# Patient Record
Sex: Female | Born: 1935 | Race: White | Hispanic: No | Smoking: Never smoker
Health system: Southern US, Community
[De-identification: ages and names within clinical notes are randomized; demographics above are authoritative.]

## PROBLEM LIST (undated history)

## (undated) DIAGNOSIS — I10 Essential (primary) hypertension: Secondary | ICD-10-CM

## (undated) DIAGNOSIS — K219 Gastro-esophageal reflux disease without esophagitis: Secondary | ICD-10-CM

## (undated) DIAGNOSIS — E039 Hypothyroidism, unspecified: Secondary | ICD-10-CM

## (undated) DIAGNOSIS — I2510 Atherosclerotic heart disease of native coronary artery without angina pectoris: Secondary | ICD-10-CM

## (undated) DIAGNOSIS — I429 Cardiomyopathy, unspecified: Secondary | ICD-10-CM

## (undated) DIAGNOSIS — F0390 Unspecified dementia without behavioral disturbance: Secondary | ICD-10-CM

## (undated) DIAGNOSIS — M1990 Unspecified osteoarthritis, unspecified site: Secondary | ICD-10-CM

## (undated) DIAGNOSIS — E079 Disorder of thyroid, unspecified: Secondary | ICD-10-CM

## (undated) DIAGNOSIS — C801 Malignant (primary) neoplasm, unspecified: Secondary | ICD-10-CM

## (undated) DIAGNOSIS — J90 Pleural effusion, not elsewhere classified: Secondary | ICD-10-CM

## (undated) DIAGNOSIS — E785 Hyperlipidemia, unspecified: Secondary | ICD-10-CM

## (undated) DIAGNOSIS — D649 Anemia, unspecified: Secondary | ICD-10-CM

## (undated) DIAGNOSIS — N182 Chronic kidney disease, stage 2 (mild): Secondary | ICD-10-CM

## (undated) HISTORY — DX: Chronic kidney disease, stage 2 (mild): N18.2

## (undated) HISTORY — DX: Cardiomyopathy, unspecified: I42.9

## (undated) HISTORY — DX: Unspecified dementia, unspecified severity, without behavioral disturbance, psychotic disturbance, mood disturbance, and anxiety: F03.90

## (undated) HISTORY — PX: EYE SURGERY: SHX253

## (undated) HISTORY — PX: CORONARY ANGIOPLASTY WITH STENT PLACEMENT: SHX49

## (undated) HISTORY — DX: Unspecified osteoarthritis, unspecified site: M19.90

## (undated) HISTORY — DX: Atherosclerotic heart disease of native coronary artery without angina pectoris: I25.10

## (undated) HISTORY — PX: OTHER SURGICAL HISTORY: SHX169

## (undated) HISTORY — DX: Hypothyroidism, unspecified: E03.9

---

## 2001-05-09 ENCOUNTER — Ambulatory Visit (HOSPITAL_COMMUNITY): Admission: RE | Admit: 2001-05-09 | Discharge: 2001-05-09 | Payer: Self-pay | Admitting: Gastroenterology

## 2001-05-09 ENCOUNTER — Encounter (INDEPENDENT_AMBULATORY_CARE_PROVIDER_SITE_OTHER): Payer: Self-pay | Admitting: Specialist

## 2001-05-12 ENCOUNTER — Encounter: Payer: Self-pay | Admitting: Gastroenterology

## 2001-05-12 ENCOUNTER — Ambulatory Visit (HOSPITAL_COMMUNITY): Admission: RE | Admit: 2001-05-12 | Discharge: 2001-05-12 | Payer: Self-pay | Admitting: Gastroenterology

## 2001-05-15 ENCOUNTER — Ambulatory Visit: Admission: RE | Admit: 2001-05-15 | Discharge: 2001-08-13 | Payer: Self-pay | Admitting: Radiation Oncology

## 2001-05-21 ENCOUNTER — Ambulatory Visit (HOSPITAL_COMMUNITY): Admission: RE | Admit: 2001-05-21 | Discharge: 2001-05-21 | Payer: Self-pay | Admitting: Radiation Oncology

## 2001-05-21 ENCOUNTER — Encounter: Payer: Self-pay | Admitting: Radiation Oncology

## 2001-06-16 ENCOUNTER — Encounter: Admission: RE | Admit: 2001-06-16 | Discharge: 2001-06-16 | Payer: Self-pay

## 2001-06-16 ENCOUNTER — Ambulatory Visit (HOSPITAL_BASED_OUTPATIENT_CLINIC_OR_DEPARTMENT_OTHER): Admission: RE | Admit: 2001-06-16 | Discharge: 2001-06-16 | Payer: Self-pay

## 2001-06-17 ENCOUNTER — Encounter: Payer: Self-pay | Admitting: *Deleted

## 2001-06-17 ENCOUNTER — Ambulatory Visit (HOSPITAL_COMMUNITY): Admission: RE | Admit: 2001-06-17 | Discharge: 2001-06-17 | Payer: Self-pay | Admitting: *Deleted

## 2001-06-18 ENCOUNTER — Ambulatory Visit (HOSPITAL_COMMUNITY): Admission: AD | Admit: 2001-06-18 | Discharge: 2001-06-18 | Payer: Self-pay

## 2001-06-19 ENCOUNTER — Emergency Department (HOSPITAL_COMMUNITY): Admission: EM | Admit: 2001-06-19 | Discharge: 2001-06-19 | Payer: Self-pay | Admitting: Emergency Medicine

## 2001-07-21 ENCOUNTER — Inpatient Hospital Stay (HOSPITAL_COMMUNITY): Admission: EM | Admit: 2001-07-21 | Discharge: 2001-07-25 | Payer: Self-pay | Admitting: Oncology

## 2001-07-21 ENCOUNTER — Encounter: Payer: Self-pay | Admitting: Oncology

## 2001-09-15 ENCOUNTER — Ambulatory Visit (HOSPITAL_COMMUNITY): Admission: RE | Admit: 2001-09-15 | Discharge: 2001-09-15 | Payer: Self-pay | Admitting: *Deleted

## 2001-09-15 ENCOUNTER — Encounter: Payer: Self-pay | Admitting: *Deleted

## 2001-10-20 ENCOUNTER — Ambulatory Visit (HOSPITAL_COMMUNITY): Admission: RE | Admit: 2001-10-20 | Discharge: 2001-10-20 | Payer: Self-pay | Admitting: Gastroenterology

## 2001-10-20 ENCOUNTER — Encounter: Payer: Self-pay | Admitting: Gastroenterology

## 2002-01-21 ENCOUNTER — Encounter: Payer: Self-pay | Admitting: *Deleted

## 2002-01-21 ENCOUNTER — Ambulatory Visit (HOSPITAL_COMMUNITY): Admission: RE | Admit: 2002-01-21 | Discharge: 2002-01-21 | Payer: Self-pay | Admitting: *Deleted

## 2002-04-06 ENCOUNTER — Encounter: Payer: Self-pay | Admitting: *Deleted

## 2002-04-06 ENCOUNTER — Ambulatory Visit (HOSPITAL_COMMUNITY): Admission: RE | Admit: 2002-04-06 | Discharge: 2002-04-06 | Payer: Self-pay | Admitting: *Deleted

## 2002-07-06 ENCOUNTER — Encounter: Payer: Self-pay | Admitting: *Deleted

## 2002-07-06 ENCOUNTER — Ambulatory Visit (HOSPITAL_COMMUNITY): Admission: RE | Admit: 2002-07-06 | Discharge: 2002-07-06 | Payer: Self-pay | Admitting: *Deleted

## 2002-09-03 ENCOUNTER — Ambulatory Visit (HOSPITAL_COMMUNITY): Admission: RE | Admit: 2002-09-03 | Discharge: 2002-09-03 | Payer: Self-pay | Admitting: *Deleted

## 2002-09-03 ENCOUNTER — Encounter: Payer: Self-pay | Admitting: *Deleted

## 2002-11-08 ENCOUNTER — Inpatient Hospital Stay (HOSPITAL_COMMUNITY): Admission: EM | Admit: 2002-11-08 | Discharge: 2002-11-11 | Payer: Self-pay

## 2002-11-08 ENCOUNTER — Encounter: Payer: Self-pay | Admitting: Emergency Medicine

## 2004-03-14 LAB — HM DEXA SCAN

## 2004-11-14 ENCOUNTER — Ambulatory Visit: Payer: Self-pay | Admitting: Hematology & Oncology

## 2005-06-15 ENCOUNTER — Ambulatory Visit: Payer: Self-pay | Admitting: Hematology & Oncology

## 2006-02-23 ENCOUNTER — Emergency Department (HOSPITAL_COMMUNITY): Admission: EM | Admit: 2006-02-23 | Discharge: 2006-02-23 | Payer: Self-pay | Admitting: Emergency Medicine

## 2006-06-20 ENCOUNTER — Encounter: Admission: RE | Admit: 2006-06-20 | Discharge: 2006-06-20 | Payer: Self-pay | Admitting: Family Medicine

## 2007-10-14 ENCOUNTER — Ambulatory Visit (HOSPITAL_COMMUNITY): Admission: RE | Admit: 2007-10-14 | Discharge: 2007-10-14 | Payer: Self-pay | Admitting: Gastroenterology

## 2007-10-30 ENCOUNTER — Ambulatory Visit (HOSPITAL_COMMUNITY): Admission: RE | Admit: 2007-10-30 | Discharge: 2007-10-30 | Payer: Self-pay | Admitting: Gastroenterology

## 2007-11-25 ENCOUNTER — Ambulatory Visit (HOSPITAL_COMMUNITY): Admission: RE | Admit: 2007-11-25 | Discharge: 2007-11-25 | Payer: Self-pay | Admitting: Gastroenterology

## 2007-12-17 ENCOUNTER — Ambulatory Visit (HOSPITAL_COMMUNITY): Admission: RE | Admit: 2007-12-17 | Discharge: 2007-12-17 | Payer: Self-pay | Admitting: Gastroenterology

## 2008-01-01 ENCOUNTER — Ambulatory Visit (HOSPITAL_COMMUNITY): Admission: RE | Admit: 2008-01-01 | Discharge: 2008-01-01 | Payer: Self-pay | Admitting: Gastroenterology

## 2008-05-25 ENCOUNTER — Ambulatory Visit (HOSPITAL_COMMUNITY): Admission: RE | Admit: 2008-05-25 | Discharge: 2008-05-25 | Payer: Self-pay | Admitting: Gastroenterology

## 2008-06-10 ENCOUNTER — Ambulatory Visit (HOSPITAL_COMMUNITY): Admission: RE | Admit: 2008-06-10 | Discharge: 2008-06-10 | Payer: Self-pay | Admitting: Gastroenterology

## 2010-01-19 ENCOUNTER — Ambulatory Visit (HOSPITAL_COMMUNITY): Admission: RE | Admit: 2010-01-19 | Discharge: 2010-01-19 | Payer: Self-pay | Admitting: Ophthalmology

## 2010-02-16 ENCOUNTER — Ambulatory Visit (HOSPITAL_COMMUNITY): Admission: RE | Admit: 2010-02-16 | Discharge: 2010-02-16 | Payer: Self-pay | Admitting: Ophthalmology

## 2010-09-10 ENCOUNTER — Encounter: Payer: Self-pay | Admitting: Interventional Radiology

## 2010-11-04 ENCOUNTER — Encounter: Payer: Self-pay | Admitting: *Deleted

## 2010-11-04 DIAGNOSIS — M8580 Other specified disorders of bone density and structure, unspecified site: Secondary | ICD-10-CM | POA: Insufficient documentation

## 2010-11-04 DIAGNOSIS — G8929 Other chronic pain: Secondary | ICD-10-CM

## 2010-11-04 DIAGNOSIS — I2510 Atherosclerotic heart disease of native coronary artery without angina pectoris: Secondary | ICD-10-CM

## 2010-11-04 DIAGNOSIS — M1990 Unspecified osteoarthritis, unspecified site: Secondary | ICD-10-CM

## 2010-11-04 DIAGNOSIS — M549 Dorsalgia, unspecified: Secondary | ICD-10-CM

## 2010-11-04 DIAGNOSIS — I10 Essential (primary) hypertension: Secondary | ICD-10-CM | POA: Insufficient documentation

## 2010-11-04 DIAGNOSIS — C159 Malignant neoplasm of esophagus, unspecified: Secondary | ICD-10-CM | POA: Insufficient documentation

## 2010-11-04 DIAGNOSIS — E039 Hypothyroidism, unspecified: Secondary | ICD-10-CM | POA: Insufficient documentation

## 2010-11-04 DIAGNOSIS — K219 Gastro-esophageal reflux disease without esophagitis: Secondary | ICD-10-CM | POA: Insufficient documentation

## 2010-11-04 DIAGNOSIS — E785 Hyperlipidemia, unspecified: Secondary | ICD-10-CM | POA: Insufficient documentation

## 2010-11-06 LAB — POCT I-STAT 4, (NA,K, GLUC, HGB,HCT)
Glucose, Bld: 99 mg/dL (ref 70–99)
HCT: 38 % (ref 36.0–46.0)
Hemoglobin: 12.9 g/dL (ref 12.0–15.0)
Potassium: 4.4 mEq/L (ref 3.5–5.1)

## 2010-11-07 ENCOUNTER — Encounter: Payer: Self-pay | Admitting: *Deleted

## 2011-01-02 NOTE — Op Note (Signed)
NAME:  Allison Collier, Allison Collier              ACCOUNT NO.:  [id number]   MEDICAL RECORD NO.:  [id number]          PATIENT TYPE:  AMB   LOCATION:  ENDO                         FACILITY:  [facility]   PHYSICIAN:  John C. Madilyn Fireman, M.D.    DATE OF BIRTH:  May 02, 1921   DATE OF PROCEDURE:  01/01/2008  DATE OF DISCHARGE:                               OPERATIVE REPORT   PROCEDURE:  Esophagoscopy with esophageal dilatation.   INDICATIONS FOR PROCEDURE:  Known stricture from previous esophageal  cancer and radiation 6 years ago.  Procedure is with the intent of  gradually dilating her stricture up to allow her to liberalize her diet  and also at the patient's request to consider removed her PEG tube which  could not be easily removed pulling it through the stoma and might  presumably need to be cut internally.   PROCEDURE:  The patient was placed in the left lateral decubitus  position and placed on the pulse monitor with continuous low-flow oxygen  delivered by nasal cannula.  She was sedated with 75 mcg IV fentanyl and  8 mg IV Versed.  The Olympus video endoscope was advanced under direct  vision into the oropharynx and esophagus.  As before, there was tight  stricture at approximately 30 cm with significant fibrosis, no visible  evidence of malignancy and I could not pass the scope beyond it.  The  guidewire was passed under fluoroscopic visualization easily into the  stomach and Savary dilators of 12.8 and 14 were passed successively over  the guidewire with moderate resistance and on the first dilator and  slightly more resistance on the second.  There was some blood seen on  the second dilator only which was removed together with the wire.  I  elected not to attempt to repeat endoscopy.  The scope was then  withdrawn and the patient returned to the recovery room in stable  condition.  She tolerated the procedure well.  There were no immediate  complications.   IMPRESSION:  Persistent esophageal  stricture with inability to dilate  any beyond 14 mm which was what was also done 2 weeks earlier.   PLAN:  Will have the patient follow-up in my office and discuss options  from here including simply remaining on a pureed full liquid diet and  leaving the PEG tube in as a backup versus further dilatation versus  possible esophageal stenting.           ______________________________  Everardo All. Madilyn Fireman, M.D.     JCH/MEDQ  D:  01/01/2008  T:  01/01/2008  Job:  [id number]

## 2011-01-02 NOTE — Op Note (Signed)
NAME:  Allison Collier, Allison Collier              ACCOUNT NO.:  [id number]   MEDICAL RECORD NO.:  [id number]          PATIENT TYPE:  AMB   LOCATION:  ENDO                         FACILITY:  [facility]   PHYSICIAN:  John C. Madilyn Fireman, M.D.    DATE OF BIRTH:  1921/06/15   DATE OF PROCEDURE:  12/17/2007  DATE OF DISCHARGE:                               OPERATIVE REPORT   PROCEDURE:  Esophagoscopy with esophageal dilatation.   INDICATIONS FOR PROCEDURE:  Radiation stricture from remote treatment of  esophageal cancer.   DESCRIPTION OF PROCEDURE:  The patient was placed in the left lateral  decubitus position and placed on the pulse monitor with continuous low-  flow oxygen delivered by nasal cannula.  She was sedated with 50 mcg IV  fentanyl 4 mg of IV Versed.  The Olympus video endoscope was advanced  under direct vision to the oropharynx and esophagus.  The esophagus was  straight and of normal caliber, initially, but tapered to a pin point  stricture at about 28 cm.   I elected not to try to pass the scope through it.  A guidewire was  passed easily into the stomach and Savary dilators of 12, 12.8 and 14  were passed over the guidewire with mild-to-moderate resistance and  blood seen on the last dilator only.  I elected not to go back down with  scope.  The last dilator was withdrawn, and the patient returned to the  recovery room in stable condition.  She tolerated the procedure well,  and there were no immediate complications.  There were no immediate  complications.   IMPRESSION:  1. Persistent esophageal stricture dilated to 14 mm.   PLAN:  We will repeat the procedure in 2-3 weeks and attempt dilation up  to 15 or perhaps 16 mm.  At that time if this is successful, we will  consider attempting to remove the PEG-tube which may require cutting the  tube internally.           ______________________________  Everardo All Madilyn Fireman, M.D.     JCH/MEDQ  D:  12/17/2007  T:  12/17/2007  Job:  [id number]   cc:    Ernestina Penna, M.D.  Fax: [phone]

## 2011-01-02 NOTE — Op Note (Signed)
NAME:  Allison Collier, Allison Collier              ACCOUNT NO.:  [id number]   MEDICAL RECORD NO.:  [id number]          PATIENT TYPE:  AMB   LOCATION:  ENDO                         FACILITY:  [facility]   PHYSICIAN:  John C. Madilyn Fireman, M.D.    DATE OF BIRTH:  1921/06/07   DATE OF PROCEDURE:  10/14/2007  DATE OF DISCHARGE:                               OPERATIVE REPORT   PROCEDURE PERFORMED:  Esophagoscopy with esophageal dilatation.   INDICATIONS. FOR PROCEDURE:  Patient with esophageal stricture secondary  to prior radiation for esophageal cancer.  She is existing off a full  liquid diet which she is able to take independently with PEG tube which  she also wants removed.  The procedure is to achieve further dilatation  of the esophagus which was originally dilated to 9 mm one week ago, and  if possible, to reach the stomach and potentially removed the PEG, as  well.   PROCEDURE:  The patient was placed in the left lateral decubitus  position and placed on the pulse monitor with continuous low flow oxygen  delivered by nasal cannula.  She was sedated with 15 mcg IV fentanyl and  5 mg IV Versed.  The Olympus video endoscope was advanced under direct  vision into the oropharynx and esophagus.  The esophagus was straight  and of normal caliber only down to about 25 cm where there was rapid  tapering to a tight narrowing that I could not traverse with the scope.  It had a benign appearance as before and appeared slightly more open  than on the previous procedure.  I was able to pass a guidewire under  fluoroscopic visualization freely into the stomach and passed Savary  dilators of 9, 10, and 11 mm with moderate resistance and a moderate  amount of blood seen on the second and third dilators.  I did reintubate  her esophagus, but it did not appear to be feasible to pass the standard  endoscope beyond through the traumatized stricture and I did not attempt  it.  The scope was then withdrawn and the patient  returned to the  recovery room in stable condition.  She tolerated the procedure well and  there were no immediate complications.   Arrangements were made to have radiology come over and remove her  gastrostomy tube that they had placed previously.   IMPRESSION:  Persistent esophageal stricture, unable to traverse the  scope, but dilated to 11 mm.   PLAN:  Will continue serial dilatations with next procedure scheduled  for approximately two weeks.           ______________________________  Everardo All Madilyn Fireman, M.D.     JCH/MEDQ  D:  10/14/2007  T:  10/14/2007  Job:  [id number]   cc:   Ernestina Penna, M.D.  Fax: [phone]

## 2011-01-02 NOTE — Op Note (Signed)
NAME:  Allison Collier, Allison Collier              ACCOUNT NO.:  [id number]   MEDICAL RECORD NO.:  [id number]          PATIENT TYPE:  AMB   LOCATION:  ENDO                         FACILITY:  [facility]   PHYSICIAN:  John C. Madilyn Fireman, M.D.    DATE OF BIRTH:  Nov 29, 1920   DATE OF PROCEDURE:  06/10/2008  DATE OF DISCHARGE:                               OPERATIVE REPORT   REPORT TITLE:  An esophagogastroduodenoscopy with esophageal dilatation  and foreign body removal.   INDICATIONS FOR PROCEDURE:  The patient with chronic radiation stricture  secondary to radiation therapy to previous esophageal carcinoma.  She  has undergone serial dilatations and would like her PEG tube out, if  possible.  There was some difficulty in original attempt to pulling the  tube straight out and have delayed any further attempts until we had to  dilate her esophagus sufficiently in case we needed to cut the tube and  retrieve the bumper through the esophagus.   PROCEDURE:  The patient was placed  on the pulse monitor with continuous  low-flow oxygen delivered by nasal cannula.  She was sedated with 40 mcg  IV fentanyl and 4 mg IV Versed.  The Olympus video endoscope was  advanced under direct vision into the oropharynx and esophagus.  As on  previous procedures, there was a narrowing from about 25-28 cm to a  tight lumen, the scope would not traverse.  I elected to pass a  guidewire through it as on previous occasions under fluoroscopic  visualization with guidewire anchored in the stomach, and I then passed  dilators of 12.8-14 mm with mild-to-moderate resistance and some blood  seen on withdrawal of the last two dilators.  The scope was reintroduced  and the friable bloody area of the esophagus was seen at about 25-28 cm,  endoscope passed easily through it.  The distal 10-cm esophagus beyond  it appeared relatively normal.  The stomach was entered and old brownish  PEG tube bumper was seen.  The remainder of the stomach and  duodenum was  inspected, and appeared to be within normal limits.  Jeananne Rama  from  Radiology assisted with the procedure and attempted to pull the PEG tube  out through the stomach with endoscopic visualization maintained  internally, however, the tubing broke and he grasped the remaining  portions and pulled it, neck broke as well.  We, therefore, used a Lucina Mellow  Net snare the bumper and remained short segment of tube and pulled it  out through the esophagus with mild resistance.  The patient was then  returned to the recovery room in stable condition.  Her PEG site was  dressed with antibiotic ointment, sterile 4 x 4s.  The patient tolerated  the procedure well and there were no immediate complications.   IMPRESSION:  1. Esophageal stricture dilated to 14 mm.  2. Status post successful removal of foreign body PEG tube fragment      with a [employer].   PLAN:  Advance diet as tolerated and we will observe response to  dilatation.  ______________________________  Everardo All Madilyn Fireman, M.D.    JCH/MEDQ  D:  06/10/2008  T:  06/11/2008  Job:  [id number]   cc:   Ernestina Penna, M.D.

## 2011-01-02 NOTE — Op Note (Signed)
NAME:  Allison Collier, ROUGEAU              ACCOUNT NO.:  [id number]   MEDICAL RECORD NO.:  [id number]          PATIENT TYPE:  AMB   LOCATION:  ENDO                         FACILITY:  [facility]   PHYSICIAN:  John C. Madilyn Fireman, M.D.    DATE OF BIRTH:  1921-02-03   DATE OF PROCEDURE:  05/25/2008  DATE OF DISCHARGE:                               OPERATIVE REPORT   INDICATIONS FOR PROCEDURE:  The patient with history of chronic  stricture related to esophageal cancer and subsequent radiation, which  she appears to have been cured of.   PROCEDURE:  The patient was placed in the left lateral decubitus  position and placed on the pulse monitor with continuous low-flow oxygen  delivered by nasal cannula.  She was sedated with 70 mcg IV fentanyl and  7 mg IV Versed.  Olympus video endoscope was advanced under direct  vision into the oropharynx and esophagus.  The esophagus was straight of  normal caliber proximally with  circular narrowing about 28 cm beyond  which the scope would not pass.  I could see the mucosa beyond it.  Guidewire was placed under fluoroscopic visualization into the stomach  and the scope withdrawn.  Savary dilators of 9, 11, 12, and 12.8 were  passed successively with minimal resistance and a slight amount of blood  seen on the third and fourth dilators only.  The last dilator was  removed with the wire and the scope reintroduced.  The esophagus was  friable, but beyond the proximal stricture, that the stricture appeared  to be of very short length and the scope passed into the stomach without  much resistance.  There was no PEG bumper seen in the gastric lumen.  Stomach otherwise appeared normal as the duodenum.  The scope was then  withdrawn and the patient returned to the recovery room in stable  condition.  She tolerated the procedure well.  There were no immediate  complications.   IMPRESSION:  Esophageal stricture dilated to 12.8 mm.   PLAN:  We will probably repeat EGD with  esophageal dilatation and remove  her PEG in about 2 weeks if eating is sufficiently improved.           ______________________________  Everardo All Madilyn Fireman, M.D.     JCH/MEDQ  D:  05/25/2008  T:  05/26/2008  Job:  [id number]   cc:   Ernestina Penna, M.D.

## 2011-01-02 NOTE — Op Note (Signed)
NAME:  Allison Collier, Allison Collier              ACCOUNT NO.:  [id number]   MEDICAL RECORD NO.:  [id number]          PATIENT TYPE:  AMB   LOCATION:  ENDO                         FACILITY:  [facility]   PHYSICIAN:  John C. Madilyn Fireman, M.D.    DATE OF BIRTH:  [birth year]   DATE OF PROCEDURE:  10/30/2007  DATE OF DISCHARGE:                               OPERATIVE REPORT   Esophagoscopy with esophageal dilatation.   INDICATIONS FOR PROCEDURE:  Dysphagia with history of esophageal  stricture, last dilated to 11 mm approximately two and a half weeks ago.   PROCEDURE:  The patient was placed in the left lateral decubitus  position and placed on the pulse monitor with continuous low-flow oxygen  delivered by nasal cannula.  He was sedated with 40 mcg IV fentanyl and  4 mg IV Versed.  The Olympus video endoscope was advanced under direct  vision into the oropharynx and esophagus.  The esophagus was straight  initially but narrowed rapidly to a tight stricture at about 25 cm,  estimated diameter of about 8 mm.  I could not pass a standard scope  through it.  Under fluoroscopic visualization, a guidewire was passed  into the stomach and then Savary dilators of 11, 12, and 12.8 were  passed over with moderate resistance with a small amount of blood over  the guidewire under fluoroscopic visualization.  I elected not to try to  intubate the stomach with the scope afterwards.  The last dilator was  removed together with a wire, and the patient returned to the recovery  room in stable condition.  She tolerated the procedure well, and there  were no immediate complications.   IMPRESSION:  Persistent tight esophageal stricture.   PLAN:  Will continue serial dilations with an attempt to get her dilated  at least to 14-15 mm at which time we will also complete inspection of  the esophagus and stomach and attempt to remove her PEG tube.           ______________________________  Everardo All Madilyn Fireman, M.D.     JCH/MEDQ  D:   10/30/2007  T:  10/31/2007  Job:  [id number]   cc:   Ernestina Penna, M.D.

## 2011-01-02 NOTE — Op Note (Signed)
NAMETALAYAH, PICARDI              ACCOUNT NO.:  [id number]   MEDICAL RECORD NO.:  [id number]          PATIENT TYPE:  AMB   LOCATION:  ENDO                         FACILITY:  [facility]   PHYSICIAN:  John C. Madilyn Fireman, M.D.    DATE OF BIRTH:  03/06/1921   DATE OF PROCEDURE:  11/25/2007  DATE OF DISCHARGE:                               OPERATIVE REPORT   PROCEDURE:  Esophagoscopy with esophageal dilatation.   INDICATIONS FOR PROCEDURE:  Dysphagia with known esophageal stricture.   DESCRIPTION OF PROCEDURE:  The patient was placed in the left lateral  decubitus position and placed on the pulse monitor with continuous low-  flow oxygen delivered by nasal cannula.  He was sedated with 37.5 mcg  intravenous fentanyl and 3 mg intravenous Versed.  The Olympus video  endoscope was advanced under direct vision into the oropharynx and  esophagus.  The esophagus was straight and of normal caliber initially  with a rapid tapering to 8 mm.  The scope would not pass through it.  I  advanced a guidewire through the needle all strictured area and under  fluoroscopic visualization passed 12 and 12.8 mm dilators with moderate  resistance and a significant amount of blood seen on the last dilator.  The last dilator was removed together with the wire.  I did not  reintroduce the scope or try to advance it beyond the stricture.  The  scope was then withdrawn and the patient returned to the recovery room  in stable condition.  She tolerated the procedure well.  There were no  immediate complications.   IMPRESSION:  Persistent stricture of the middle esophagus.   PLAN:  Will proceed with a repeat study in two weeks to try to dilate  her up to 14-15 mm and eventually remove the PEG tube as well.           ______________________________  Everardo All. Madilyn Fireman, M.D.     JCH/MEDQ  D:  11/25/2007  T:  11/25/2007  Job:  [id number]   cc:   Ernestina Penna, M.D.  Fax: [phone]

## 2011-01-05 NOTE — Procedures (Signed)
[facility]  Patient:    BERTIE, MCCONATHY Visit Number: [id number] MRN: [id number]          Service Type: Attending:  Zigmund Daniel, M.D. Dictated by:   Zigmund Daniel, M.D. Proc. Date: 06/18/01                             Procedure Report  PREOPERATIVE DIAGNOSIS: Malpositioning of central venous port.  POSTOPERATIVE DIAGNOSIS:  Malpositioning of central venous port.  OPERATION:  Revision of port.  SURGEON:  Zigmund Daniel, M.D.  ANESTHESIA:  Local with sedation.  PROCEDURE: After the patient was lightly sedated and had removal of the access needle from her port and careful preparation and draping of the area of the port, I liberally infused local anesthetic around the port and the venous access incision.  I then opened and slightly enlarged the incision where I had accessed the vein and saw that there was an acute angle of the venous tubing at that point.  Evidently, the port had ridden up somewhat in the pocket and was causing the malpositioning and kink.  I tried to reposition it by twisting it and advancing the catheter, but in that position at that location, I could not do so.  I, therefore, opened the incision which had been made to make a pocket for the port and enlarged the pocket somewhat so that I could place the port down a bit, and I sutured it down securely with two sutures of 4-0 Vicryl, and that produced a nice, smooth curve of the venous tubing as it went into the venous access site and left the port.  I then used fluoroscopy to confirm that and then closed the incisions with intercuticular 4-0 Vicryl and Steri-Strips.  I accessed the port, and it flushed nicely and allowed return of blood very nicely.  I flushed it with Heparin concentrate and capped the access needle, leaving the port accessed.  I then dressed the area with a bandage.  The patient tolerated the operation well. Dictated by:   Zigmund Daniel,  M.D. Attending:  Zigmund Daniel, M.D. DD:  06/18/01 TD:  06/19/01 Job: [id number]

## 2011-01-05 NOTE — H&P (Signed)
NAME:  Allison Collier, Allison Collier                        ACCOUNT NO.:  [id number]   MEDICAL RECORD NO.:  [id number]                   PATIENT TYPE:  INP   LOCATION:  2106                                 FACILITY:  [facility]   PHYSICIAN:  Francisca December, M.D.               DATE OF BIRTH:  05-Dec-1920   DATE OF ADMISSION:  11/08/2002  DATE OF DISCHARGE:                                HISTORY & PHYSICAL   ADMISSION DIAGNOSES:  Acute anterior myocardial infarction.   CHIEF COMPLAINT:  Chest pain/arm pain, unrelieved with Tylenol and rest.   HISTORY OF PRESENT ILLNESS:  A 77 year old widowed white female with a  history of hypertension and elevated cholesterol in the past, not currently  on medications.  Also with a history of esophageal CA and PEG tube, but no  prior history of MI or CAD known to patient.   For about three days, the patient had been experiencing episodic bilateral  arm cortical aching to the bone, right greater than left, and substernal  chest discomfort with tightness in the throat.  She would notice some mild  shortness of breath with activity.  All episodes would occur at rest, and  she would take Tylenol with improvement in her symptoms over a 30-60 minute  period of time.  Last evening,  however, she developed bilateral arm aching,  right greater than left, substernal chest pain, throat tightness, nausea,  and shortness of breath.  This was about 9:30 p.m.  She took Tylenol and  tried to lay down without improvement in her symptoms.  Because of the  persistence of discomfort and increasing severity, the patient was brought  to the emergency room by a family member.  EKG revealed ST elevation in the  precordial leads.  She was taken emergently to the catheterization lab by  Dr. Amil Amen.   ALLERGIES:  TAXOL.   MEDICATIONS:  1. Protonix.  2. Tylenol p.r.n.  3. Jevity 1-1/2 cans t.i.d.  4. Has been on hydrochlorothiazide remotely.   PAST MEDICAL HISTORY:  1. Squamous cell  esophageal CA; status post PEG tube placement, Port-A-Cath.  2. Hypertension.  3. History of elevated cholesterol.  4. History of pernicious anemia (?).  5. Hemorrhoid.   SOCIAL HISTORY:  The patient has been widowed for several years (husband  died when youngest daughter was 22 months old).  Lives in an apartment with  her daughter.  She is the mother of 70, grandmother of 8, and great-  grandmother of 4.  She denies tobacco or alcohol use.  Retired from a [employer].  She never drove.  Rarely uses a cane.   FAMILY HISTORY:  No significant CAD or other medical problems.   REVIEW OF SYSTEMS:  Denies fevers, chills, cough, cold, congestion, or  reflux.  No melena or hematuria.  No dysuria, vomiting, or edema.  No  claudication or depression.  No presyncope or syncope.  No palpitations.   PHYSICAL EXAMINATION:  VITAL SIGNS:  Examination as of 1700 on 11/08/02  revealed a temperature of 96.8, blood pressure 106/58, pulse 90,  respirations 17, SGOT 98% on two liters nasal cannula.  GENERAL:  Alert and oriented x3.  In no acute distress.  Elderly appearing.  Slightly obese.  HEENT:  Normocephalic and atraumatic.  Positive glasses.  Dentition in poor  repair.  NECK:  Supple without bruits or masses.  LUNGS:  Clear to auscultation with symmetric excursion.  COR:  Regular rate and rhythm without murmurs, gallops, or rubs.  Normal S1  and S2.  ABDOMEN:  Soft, nontender, slightly obese.  Positive PEG site, stable.  Positive bowel sounds.  EXTREMITIES:  Right groin with 1+ ecchymosis.  Slightly full.  No palpable  hematoma.  No bruit.  Distal pulses are intact and equal bilaterally.  She  does have spider and varicose veins.  No lower extremity edema.  GENITALIA:  Normal external female genitalia.  SKIN:  With age spots.  Cafe au lait spot left cheek.  Bruising on her  left forearm.  NEUROLOGIC:  Nonfocal.  Mentation intact.  Strength equal bilaterally.   DIAGNOSTIC TESTS:   1. EKG at 0422 on 11/08/02 showed sinus rhythm with T wave inversion in aVL,     ST elevation about 1 to 1.5 mm in V1 through V3.  2. EKG on 0854 on 11/08/02 showed normal sinus rhythm with T wave inversion     in aVL, persistent ST elevation of V1 through V3 with suggestion of     slight ST elevation in V4.  3. Chest x-ray on 11/08/02 showed no acute abnormalities.   LABORATORY DATA:  WBC 5.7, hemoglobin 12.0, and platelet count 144 (slightly  low).  Potassium 3.6, BUN 18, creatinine 0.7, glucose 151.  CK 212, 2227.  MB 24.6, 420.6.  Troponin I 0.77.   IMPRESSION:  1. Acute anterior myocardial infarction.  2. History of hypertension.  3. History of elevated cholesterol.  4. Squamous cell esophageal cancer.  5. History of pernicious anemia.  6. Hemorrhoids.    PLAN:  Admit.  The patient was taken emergently to the cardiac  catheterization lab by Dr. Amil Amen.  IV heparin, IIb/IIIa inhibitor, and IV  nitroglycerin.  Aspirin and Plavix.  Beta blocker therapy.  Consider ACE  inhibitor if blood pressure tolerates.  Serial cardiac enzymes.  Cardiac  rehab phase I and II.  Check serial EKGs.  Check fasting lipid profile.  Protonix and Jevity.     Georgiann Cocker Jernejcic, P.A.                   Francisca December, M.D.    TCJ/MEDQ  D:  11/08/2002  T:  11/09/2002  Job:  [id number]   cc:   Dr. Evalee Jefferson, M.D.  [address]  [address]  [place]  [place] [address]  Fax: [phone]

## 2011-01-05 NOTE — Discharge Summary (Signed)
[facility]  Patient:    Allison Collier, Allison Collier Visit Number: [id number] MRN: [id number]          Service Type: MED Location: 4E 0401 01 Attending Physician:  Cathleen Corti Dictated by:   Marnee Guarneri, M.D. Admit Date:  07/21/2001 Discharge Date: 07/25/2001   CC:         John C. Madilyn Fireman, M.D.  Margaretmary Dys, M.D.   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  She was treated initially with carboplatin/Taxol but had a reaction to Taxol.  She has been on weekly carboplatin since that time.  She also received four days of continuous infusion 5-FU beginning June 19, 2001 concurrent with radiation.  She has tolerated all of her treatment well with esophagitis requiring PEG tube placement; however, since November 29, prior to admission, she had problems with nausea and vomiting and had been unable to tolerate her Jevity feeds. She also presented with a fever of 100.2.  She was admitted for hydration, nausea control, and fever workup.  HOSPITAL COURSE:  The patient did extremely well.  She was started on Ancef antibiotics, as well as Neupogen for profound neutropenia.  Her labs showed that she had an ANC of 0.7, hemoglobin was 10.5, platelet count was 132.  She was hydrated well and her Jevity feeds were continued.  Pureed diet was instituted on December 5 with tolerance.  She remained afebrile during the course of her hospitalization.  She completed the final rounds of radiation therapy and at the time of discharge was ambulating well and was tolerating a pureed diet well with Jevity tube feeds in continuation.  DISCHARGE DIAGNOSES: 1. Squamous cell carcinoma of the esophagus, status post concurrent    chemotherapy and radiation. 2. Febrile neutropenia,  resolved. 3. Esophagitis, resolved. 4. Malnutrition with percutaneous endoscopic gastrostomy tube placement and    Jevity tube feeds.  FOLLOWUP:  Hospital followup will be with Dr. Aliene Altes one week from discharge.  DISCHARGE MEDICATIONS: 1. Carafate. 2. Phenergan. 3. Cipro 750 mg 1 tablet p.o. b.i.d. for the next five days to complete her    antibiotic therapy. Dictated by:   Marnee Guarneri, M.D. Attending Physician:  Cathleen Corti DD:  07/25/01 TD:  07/25/01 Job: [id number]

## 2011-01-05 NOTE — Procedures (Signed)
[facility]  Patient:    Allison Collier, Allison Collier Visit Number: [id number] MRN: [id number]          Service Type: END Location: ENDO Attending Physician:  Louie Bun Proc. Date: 05/09/01 Admit Date:  05/09/2001   CC:         Monica Becton, M.D.  Marnee Guarneri, M.D.   Procedure Report  PROCEDURE:  Esophagoscopy with biopsy.  INDICATION:  Gradually progressive solid food dysphagia.  DESCRIPTION OF PROCEDURE:  The patient was placed in the left lateral decubitus position and placed on the pulse monitor with continuous low-flow oxygen delivered by nasal cannula.  She was sedated with 50 mg IV Demerol and 4 mg IV Versed.  The Olympus video endoscope was advanced into the oropharynx and esophagus.  Immediately upon entering the esophagus, there was noted to be a friable mass at the proximal margin 20 cm from the incisors.  This occupied about 50% of the circumference of the lumen and appeared to become more circumferentially at about 25 cm where the lumen could no longer be traversed and was visible as a small 3-4 mm opening, that it was felt probably would admit the tip of a pediatric scope.  Biopsies were taken of this lesion which appeared frankly malignant.  There are no obvious fistulas of the bronchial tree that could be appreciated endoscopically.  The mucosa proximal to the tumor appeared normal.  I could not see a squamocolumnar line and presumably from its location, this is a squamous tumor.  The scope was then withdrawn, and the patient returned to the recovery room in stable condition.  She tolerated the procedure well, and there were no immediate complications.  IMPRESSION:  Esophageal mass, likely malignant.  PLAN:  Will await biopsy results, obtain CT scan and arrange for consultation with general surgery and hematology/oncology. Attending Physician:  Louie Bun DD:  05/09/01 TD:  05/10/01 Job: [id number]

## 2011-01-05 NOTE — Cardiovascular Report (Signed)
NAME:  Allison Collier, Allison Collier                        ACCOUNT NO.:  [id number]   MEDICAL RECORD NO.:  [id number]                   PATIENT TYPE:  INP   LOCATION:  2106                                 FACILITY:  [facility]   PHYSICIAN:  Francisca December, M.D.               DATE OF BIRTH:  1921/05/17   DATE OF PROCEDURE:  11/08/2002  DATE OF DISCHARGE:                              CARDIAC CATHETERIZATION   PROCEDURES:  1. Left heart catheterization.  2. Coronary angiography.  3. Left ventriculogram.  4. Percutaneous transluminal coronary angioplasty/drug-eluting stent     implantation, proximal anterior descending artery.   INDICATIONS:  The patient is a 77 year old woman who has been having chest  discomfort relieved by Tylenol for the past two evenings.  This evening at  around 9:30 p.m. she had a recurrence of the discomfort, this time not  relieved by Tylenol.  It was associated with some diaphoresis and nausea.  No significant dyspnea.  She was transferred to the [facility] emergency room by  EMS, where she arrived at 00:44.  She was administered baby aspirin and  heparin as well as IV nitroglycerin.  An electrocardiogram revealed the  presence of an acute anterior wall myocardial infarction.  She was therefore  brought to the catheterization laboratory at this time to provide for  percutaneous revascularization.   DESCRIPTION OF PROCEDURE:  The patient was brought to the cardiac  catheterization laboratory, where the right groin was prepped and draped in  the usual sterile fashion.  Local anesthesia was obtained with the  infiltration of 1% lidocaine.  A 7 French catheter sheath was inserted  percutaneously into the right femoral artery utilizing an anterior approach  over a guiding J-wire.  A 6 French #4 right Judkins catheter was advanced to  the ascending aorta, where the pressure was recorded.  The right coronary os  was then engaged and cineangiography performed in LAO and RAO  projections.  The 6 French #4 right Judkins was exchanged for a 6 [place] #4 left Judkins.  Cineangiography of the left coronary artery was conducted in multiple LAO  and RAO projections.  The 6 [place] #4 left Judkins was then exchanged for a  7 [place] #3.5 Voda left guiding catheter.  An ACT was obtained and found to  be 288 seconds.  After cannulation of the left coronary os, a 0.014 inch  Scimed Luge intracoronary guidewire was passed across the lesion in the  proximal LAD without difficulty.  This provided some minimal antegrade flow  and identified a discrete 90-100% lesion at the origin of the small diagonal  branch.  A 3.5/15 mm Scimed Maverick intracoronary balloon was advanced into  position and inflated to a peak pressure of 8 atmospheres for approximately  one minute.  This balloon was deflated and removed.  The patient received  0.2 mg of nitroglycerin intracoronary.  A 3.5/16 mm Scimed Taxus drug-  eluting stent  was then advanced into position and inflated there to 14  atmospheres for 60 seconds.  This balloon was deflated and removed.  The  patient received a second intracoronary aliquot of 0.2 mg of nitroglycerin.  Cineangiography was then performed in orthogonal views both with and without  the guidewire in place, confirming wide patency of the anterior descending  artery.  Initial TIMI grade was 2, increasing to 3 at the completion of the  procedure.  The guiding catheter was then removed, the sheath was sutured  into place, and the patient was transported to the recovery area in stable  condition with an intact distal pulse.  The closing ACT was 289 seconds.   Hemodynamics:  Systemic arterial pressure initially was 146/90 with a mean  of 116 mmHg.  It fell to 93/60 with a mean of 76 mmHg following the  intracoronary nitroglycerin.  The left ventricular end-diastolic pressure  was 17 mmHg pre-ventriculogram.  There was no systolic gradient across the  aortic valve.   The  left ventriculogram demonstrated anterolateral akinesis and apical and  inferoapical dyskinesis that was mild.  The inferior inferobase and  anterobase were moving well.  There was no significant mitral regurgitation.  No significant coronary calcification was seen.  A visual estimate of the  ejection fraction is 35-40%.   There was a left-dominant coronary system present.  The main left coronary  artery was short and normal.   The left anterior descending and its branches were highly diseased; the  vessel was completely occluded in the proximal segment.   The left circumflex coronary artery and its branches were widely patent and  without significant obstruction.  A small first marginal branch arises,  followed by a second large bifurcating second marginal.  The third marginal  is small, as is the fourth marginal.  The fourth marginal is the true obtuse  marginal.  The vessel then gives rise to the posterior descending artery,  which is moderately large, and the AV nodal artery.  None of these vessels  have any significant obstruction.  There are two 10% stenoses in the  proximal portion of the large second marginal.   The right coronary artery and its branches were small and nondominant  without significant obstruction.   Following balloon dilatation and stent implantation in the proximal to mid-  anterior descending artery, there was no residual stenosis.  Initially the  distal vessel was relatively diffusely spasmed; however, it did open up  adequately following intracoronary nitroglycerin.   Collateral vessels were not seen.   FINAL IMPRESSION:  1. Atherosclerotic coronary vascular disease, single-vessel.  2. Status post successful percutaneous transluminal coronary angioplasty and     drug-eluting stent implantation, proximal anterior descending artery.  3. Chest and bilateral arm discomfort was relieved following balloon     dilatation and stent implantation. 4. Moderately  diminished left ventricular systolic function with extensive     regional wall motion abnormality as noted.                                               Francisca December, M.D.    JHE/MEDQ  D:  11/08/2002  T:  11/09/2002  Job:  [id number]   cc:   [facility]   Cardiac Catheterization Laboratory

## 2011-01-05 NOTE — H&P (Signed)
[facility]  Patient:    Allison Collier, GLANZER Visit Number: [id number] MRN: [id number]          Service Type: MED Location: 4E 0401 01 Attending Physician:  Cathleen Corti Dictated by:   Valentino Hue. Magrinat, M.D. Admit Date:  07/21/2001   CC:         Aliene Altes, M.D.  John C. Madilyn Fireman, M.D.  Margaretmary Dys, M.D.  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  She was treated initially with carboplatin/Taxol but had a reaction to the Taxol.  She has been on weekly carboplatin since that time, and she also received four days of continuous infusion 5-FU beginning June 19, 2001.  The patient has tolerated her treatment moderately well, with esophagitis requiring a PEG tube.  However, since last Friday, July 18, 2001, she has had problems with nausea and vomiting and has been unable to tolerate her Jevity feeds.  She also has developed a temperature to 100.2 degrees.  She is being admitted for overnight hydration, nausea control, and fever workup.  PAST MEDICAL HISTORY:  Otherwise significant for: 1. Status post Port-A-Cath placement. 2. History of hypertension. 3. History of internal hemorrhoids. 4. Question history of pernicious anemia. 5. History of elevated lipids.  ALLERGIES:  The patient is allergic to TAXOL.  MEDICATIONS:  Her only current medications are liquid Carafate and Phenergan pills and suppositories.  She is not taking Nexium or hydrochlorothiazide which she was on previously.  REVIEW OF SYSTEMS:  Significant chiefly for the nausea and vomiting.  She is also generally constipated, although she had a bowel movement this morning. She does not have bowel movements but  two or three times a week, which is a problem for her.  She has a history of hemorrhoids and bright red blood per rectum on that basis.  She has tried to use some cornstarch, but it has not worked very well, and she is quite uncomfortable when she does have a bowel movement.  She has had no unusual headaches, no visual changes, no dizziness or gait imbalance, no cough, phlegm production, or pleurisy.  There has been no hemoptysis.  There have been no problems with her urine that she is aware of.  She has not had a rash.  PHYSICAL EXAMINATION:  VITAL SIGNS:  Weight 158, her baseline weight being 163.  Temperature 100.2, pulse 113, respiratory rate 22, and blood pressure 131/82.  HEENT:  Pupils are equal.  Sclerae anicteric.  Oropharynx shows no evidence of thrush, although there is a slight amount of coating over the tongue, and it is a little bit dry but not significantly so.  NECK:  Supple.  There is no cervical, supraclavicular, or axillary adenopathy noted.  LUNGS:  No crackles or wheezes.  HEART:  Regular rate and rhythm.  ABDOMEN:  Soft and nontender.  Positive bowel sounds.  MUSCULOSKELETAL:  No peripheral edema.  NEUROLOGIC:  Nonfocal.  LABORATORY DATA:  In the office showed a total white cell count of 1.8, neutrophil count of 1.27, hemoglobin of 11.7, and platelet count of 144,000. Other laboratories will be obtained in the hospital and are pending.  The most recent film in the chart was the Port-A-Cath placement June 17, 2001.  She had CT of the chest, abdomen,  and pelvis at [facility] May 12, 2001, at the time of her initial workup, and this showed "porcelain gallbladder," small renal cysts, but no evidence of disease in the lungs or liver.  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  We will hydrate the patient, bring the nausea under control with antiemetics, and proceed to a fever workup.  We will also start her on Ancef IV empirically.  She will have her radiation treatment today, and she only lacks three more treatments, which we will certainly go through with.  Whether Dr. Katrinka Blazing decides to go on with the carboplatin and 5-FU will be up to him when he returns tomorrow.  Note that she has been receiving 170 mg weekly of carboplatin, which is due today.  She also was scheduled to restart her 5-FU pump, and she was supposed to receive 930 mg per day over 24 hours for a total of four days; certainly, she could receive that for the next three days at Dr. Lonn Georgia discretion. Dictated by:   Valentino Hue Magrinat, M.D. Attending Physician:  Cathleen Corti DD:  07/21/01 TD:  07/21/01 Job: [id number] [id number]

## 2011-01-05 NOTE — Discharge Summary (Signed)
   NAME:  Allison Collier, Allison Collier                        ACCOUNT NO.:  [id number]   MEDICAL RECORD NO.:  [id number]                   PATIENT TYPE:  INP   LOCATION:  2021                                 FACILITY:  [facility]   PHYSICIAN:  Francisca December, M.D.               DATE OF BIRTH:  12/04/1920   DATE OF ADMISSION:  11/08/2002  DATE OF DISCHARGE:  11/11/2002                                 DISCHARGE SUMMARY   Please disregard this dictation.     Georgiann Cocker Jernejcic, P.A.                   Francisca December, M.D.    TCJ/MEDQ  D:  12/23/2002  T:  12/24/2002  Job:  [id number]

## 2011-01-05 NOTE — Discharge Summary (Signed)
NAME:  Allison Collier, Allison Collier                        ACCOUNT NO.:  [id number]   MEDICAL RECORD NO.:  [id number]                   PATIENT TYPE:  INP   LOCATION:  2021                                 FACILITY:  [facility]   PHYSICIAN:  Francisca December, M.D.               DATE OF BIRTH:  [birth year]   DATE OF ADMISSION:  11/08/2002  DATE OF DISCHARGE:  11/11/2002                                 DISCHARGE SUMMARY   ADMISSION DIAGNOSES:  1. Acute anterior myocardial infarction.  2. Hypertension.  3. History of elevated cholesterol.  4. Questionable history of pernicious anemia.  5. Squamous cell carcinoma of the esophagus, status post percutaneous     endoscopic gastrostomy tube and Port-A-Cath.   DISCHARGE DIAGNOSES:  1. Acute anterior myocardial infarction -- stable.  2. Emergency percutaneous coronary intervention, left anterior descending --     essentially single-vessel disease.  3. Hypertension.  4. Hyperlipidemia.  5. Questionable history of pernicious anemia.  6. Squamous cell carcinoma of the esophagus, status post percutaneous     endoscopic gastrostomy tube and  Port-A-Cath.   HISTORY OF PRESENT ILLNESS:  The patient is a very pleasant 77 year old  widowed white female with a history of hypertension and elevated cholesterol  in the past, not currently on medicine; also a history of esophageal CA and  has had a PEG tube placement.  No prior history of MI of CAD.   For about three days, the patient has been experiencing episodic bilateral  arm aching with substernal chest discomfort and tightness in the throat.  She noticed some mild shortness of breath with activity.  Most of the  episodes would occur at rest, and then she would take Tylenol, with  improvement in her symptoms over a 30- to 60-minute period of time.  Last  evening, however, she developed bilateral arm aching, right greater than  left, and substernal chest pain with throat tightness, nausea and shortness  of  breath; this went on into the evening.  She tried to lie down, but did  not have any improvement in her symptoms.  Because of discomfort increasing  in severity, she was brought to the ER by a family member.  EKG revealed ST  elevation in the precordial leads.  She was taken emergently to the cardiac  cath lab by Dr. Francisca December.   PROCEDURES:  Emergency cardiac catheterization with intervention to the left  anterior descending on November 08, 2002 by Dr. Amil Amen.   COMPLICATIONS:  None.   CONSULTATIONS:  Cardiac rehab.   COURSE IN THE HOSPITAL:  The patient was admitted to [facility] on  November 08, 2002 with acute anterior myocardial infarction.  EKG in the  emergency room showed sinus rhythm with T wave inversion in aVL and  persistent ST elevation, V1 through V3, with suggestion of slight ST  elevation in V4.  Chest x-ray showed no acute abnormalities.  Initial  cardiac enzymes revealed a CK of 212, MB 24.6 and troponin I of 0.77.  As  mentioned above, the patient was taken emergently to the cath lab by Dr.  Amil Amen.  She was treated with IV heparin, IIb/IIIa inhibitor therapy and IV  nitroglycerin, aspirin and Plavix.  We will initiate beta blocker therapy  and consider ACE inhibitor if blood pressure tolerates.  We will cycle  cardiac enzymes.   Emergency cardiac catheterization by Dr. Amil Amen on November 08, 2002 revealed  the following:  Left main was normal.  LAD was 100% occluded proximally.  Circumflex was dominant with no significant obstructive lesions.  RCA was  nondominant with no significant obstructive lesions.  Dr. Amil Amen proceeded  with LAD intervention, reducing the 100% lesion to 0% and placed a TAXUS  Express II Monorail 3.5 x 16.0-mm stent.  Patient tolerated the procedure  well.  She had good hemostasis and intact distal pulses.  Door to balloon  was 105 minutes.  Onset to balloon was approximately five hours.   Cardiac enzyme series as follows:  CK  212, 2227, 227, 144.  MB 24.6, 420.6,  32.0, 16.5.  Troponin I of 0.077, 17.16, 11.44.   Total cholesterol 198, triglyceride 198, HDL 43 and LDL 115.   C-reactive protein was 55.800 -- considered high risk, based on lab values.   Post procedure, the patient's BUN and creatinine remained stable at 14 and  0.8.  Patient remained cardiovascularly stable post MI.  The patient was  transferred out of the intensive care unit to a regular floor bed on November 09, 2002.  She was seen and evaluated by cardiac rehab.   From a cardiovascular standpoint, she remained stable with no further chest  pain.  Medication adjustments were made.  The patient was referred to the  outpatient cardiac rehab program in [place].  The patient was felt stable for  discharge to home on November 11, 2002.   DISCHARGE MEDICATIONS:  1. Enteric-coated aspirin 325 mg daily.  2. Plavix 75 mg a day for at least nine months.  3. Protonix 40 mg a day.  4. Toprol-XL 100 mg a day.  5. Zocor 20 mg at night.  6. Nitroglycerin as needed for chest pain.  7. Lisinopril 5 mg daily.  8. Jevity one and a half cans three times a day as before via a PEG tube.   ACTIVITY:  No strenuous activity, lifting more than 5 pounds or driving  until seen back by Dr. Amil Amen.  We recommend enrollment in cardiac rehab  phase 2 as an outpatient.   DIET:  Low-fat, low-cholesterol, low-salt diet.  She should cut back her  carbohydrate intake as well.   SPECIAL DISCHARGE INSTRUCTIONS:  May shower.   FOLLOWUP:  She is asked to call the office with problems or questions.    DISCHARGE APPOINTMENTS:  Dr. Amil Amen, Monday, November 23, 2002, at 9:15.  She  should stop by for lab work prior to her appointment to include a lipid and  liver profile.  She will then have a followup with a liver profile on Dec 23, 2002.        Georgiann Cocker Jernejcic, P.A.                   Francisca December, M.D.   TCJ/MEDQ  D:  12/23/2002  T:  12/25/2002  Job:  [id number]   cc:    Dr. Mamie Laurel

## 2011-01-05 NOTE — Procedures (Signed)
[facility]  Patient:    Allison Collier, Allison Collier Visit Number: [id number] MRN: [id number]          Service Type: END Location: ENDO Attending Physician:  Louie Bun Dictated by:   Everardo All Madilyn Fireman, M.D. Proc. Date: 10/20/01 Admit Date:  10/20/2001   CC:         Aliene Altes, M.D.  Monica Becton, M.D.   Procedure Report  PROCEDURE:  Esophagoscopy.  INDICATIONS FOR PROCEDURE:  A patient with a history of squamous cell carcinoma of the esophagus status post radiation and chemotherapy. The procedure is requested to assess status of disease with treatment with only modest improvement in her dysphagia.  DESCRIPTION OF PROCEDURE:  The patient was placed in the left lateral decubitus position then placed on the pulse monitored with continuous low flow oxygen delivered by nasal cannula. She was sedated with 40 mg IV Demerol and 4 mg IV Versed. The Olympus video endoscope was advanced under direct vision into the oropharynx and esophagus. The esophagus narrowed to a concentric tight luminal stricture at about 20 cm no more than 2-3 mm in diameter. I could see approximately 1 cm into the narrowed area and saw no frank malignancy but visualization was suboptimal and there was no question that either a pediatric or standard endoscope would be able to pass through the pinpoint opening. The mucosa in the visualized areas were smooth and I did not take any biopsies. The scope was then withdrawn and the patient returned to the recovery room in stable condition. The patient tolerated the procedure well and there were no immediate complications.  IMPRESSION:  Tight proximal esophageal stricture with no visible malignancy at the proximal edge.  PLAN:  Will obtain barium swallow to better assess the nature of the stricture and will discuss with Dr. Aliene Altes if feasible and it is felt that this could be a benign radiation stricture with no visible  malignancy, could consider repeat endoscopy under fluoroscopy with gentle dilatation over a guidewire. Dictated by:   Everardo All Madilyn Fireman, M.D. Attending Physician:  Louie Bun DD:  10/20/01 TD:  10/20/01 Job: [id number]

## 2011-01-05 NOTE — Op Note (Signed)
[facility]  Patient:    Allison Collier, Allison Collier Visit Number: [id number] MRN: [id number]          Service Type: END Location: ENDO Attending Physician:  Louie Bun Dictated by:   Zigmund Daniel, M.D. Proc. Date: 06/16/01 Admit Date:  05/09/2001 Discharge Date: 05/09/2001                             Operative Report  PREOPERATIVE DIAGNOSIS:  Cancer of the esophagus.  POSTOPERATIVE DIAGNOSIS:  Cancer of the esophagus.  OPERATION PERFORMED:  Implantation of subcutaneous venous port via the right subclavian vein.  SURGEON:  Zigmund Daniel, M.D.  ANESTHESIA:  Local with sedation.  DESCRIPTION OF PROCEDURE:  After the patient was monitored and sedated and had routine preparation and draping of the upper chest and neck, I infused local anesthetic in the deltopectoral groove and under the clavicle on the right side and then anesthetized the spot for subcutaneous port placement on the anterior chest wall near the first site.  I made a small incision in the deltopectoral groove and with the patient in Trendelenburg position, I accessed the right subclavian vein with a single stick and advanced J-wire easily into the superior vena cava confirming its position fluoroscopically. I then created a subcutaneous pocket through a separate incision, implanted a preconnected port with two Vicryl sutures placed in the deep subcutaneous tissue and pectoral fascia and threaded the venous tubing through to the first incision.  Again with the patient in Trendelenburg position I dilated the subcutaneous passage to the vein and then put in a dilator and introducer assembly and removed the dilator.  I had previous cut the venous tubing to estimate the necessary length and I fed it through the introducer and peeled it away and it stayed in nicely.  I confirmed it position fluoroscopically. Using a Huber needle I achieved good return of blood and good infusion  of fluid.  I flushed it with concentrated heparin solution and removed the Huber needle. I closed the skin on both incisions with intracuticular 4-0 Vicryl and Steri-Strips.  The patient tolerated the procedure well. Dictated by:   Zigmund Daniel, M.D. Attending Physician:  Louie Bun DD:  06/16/01 TD:  06/17/01 Job: [id number]

## 2011-08-29 ENCOUNTER — Inpatient Hospital Stay (HOSPITAL_COMMUNITY)
Admission: EM | Admit: 2011-08-29 | Discharge: 2011-09-03 | DRG: 481 | Disposition: A | Discharge status: Skilled Nursing Facility | Payer: Medicare Other | Attending: Internal Medicine | Admitting: Internal Medicine

## 2011-08-29 ENCOUNTER — Encounter (HOSPITAL_COMMUNITY): Payer: Self-pay | Admitting: *Deleted

## 2011-08-29 DIAGNOSIS — N390 Urinary tract infection, site not specified: Secondary | ICD-10-CM | POA: Diagnosis present

## 2011-08-29 DIAGNOSIS — S72142A Displaced intertrochanteric fracture of left femur, initial encounter for closed fracture: Secondary | ICD-10-CM

## 2011-08-29 DIAGNOSIS — S72002A Fracture of unspecified part of neck of left femur, initial encounter for closed fracture: Secondary | ICD-10-CM | POA: Diagnosis present

## 2011-08-29 DIAGNOSIS — I2510 Atherosclerotic heart disease of native coronary artery without angina pectoris: Secondary | ICD-10-CM | POA: Diagnosis present

## 2011-08-29 DIAGNOSIS — E039 Hypothyroidism, unspecified: Secondary | ICD-10-CM | POA: Diagnosis present

## 2011-08-29 DIAGNOSIS — W010XXA Fall on same level from slipping, tripping and stumbling without subsequent striking against object, initial encounter: Secondary | ICD-10-CM | POA: Diagnosis present

## 2011-08-29 DIAGNOSIS — I10 Essential (primary) hypertension: Secondary | ICD-10-CM | POA: Diagnosis present

## 2011-08-29 DIAGNOSIS — M810 Age-related osteoporosis without current pathological fracture: Secondary | ICD-10-CM | POA: Diagnosis present

## 2011-08-29 DIAGNOSIS — S72143A Displaced intertrochanteric fracture of unspecified femur, initial encounter for closed fracture: Principal | ICD-10-CM | POA: Diagnosis present

## 2011-08-29 DIAGNOSIS — D62 Acute posthemorrhagic anemia: Secondary | ICD-10-CM | POA: Diagnosis not present

## 2011-08-29 DIAGNOSIS — K219 Gastro-esophageal reflux disease without esophagitis: Secondary | ICD-10-CM | POA: Diagnosis present

## 2011-08-29 DIAGNOSIS — M1990 Unspecified osteoarthritis, unspecified site: Secondary | ICD-10-CM | POA: Diagnosis present

## 2011-08-29 DIAGNOSIS — W19XXXA Unspecified fall, initial encounter: Secondary | ICD-10-CM

## 2011-08-29 DIAGNOSIS — M899 Disorder of bone, unspecified: Secondary | ICD-10-CM | POA: Diagnosis present

## 2011-08-29 DIAGNOSIS — M8580 Other specified disorders of bone density and structure, unspecified site: Secondary | ICD-10-CM | POA: Diagnosis present

## 2011-08-29 DIAGNOSIS — D649 Anemia, unspecified: Secondary | ICD-10-CM | POA: Diagnosis present

## 2011-08-29 DIAGNOSIS — E785 Hyperlipidemia, unspecified: Secondary | ICD-10-CM | POA: Diagnosis present

## 2011-08-29 DIAGNOSIS — M949 Disorder of cartilage, unspecified: Secondary | ICD-10-CM | POA: Diagnosis present

## 2011-08-29 HISTORY — DX: Gastro-esophageal reflux disease without esophagitis: K21.9

## 2011-08-29 HISTORY — DX: Hyperlipidemia, unspecified: E78.5

## 2011-08-29 HISTORY — DX: Disorder of thyroid, unspecified: E07.9

## 2011-08-29 HISTORY — DX: Essential (primary) hypertension: I10

## 2011-08-29 NOTE — ED Notes (Signed)
Pt reports tripping and falling.  Presently has pain in left leg.  Denies dizziness prior to fall.

## 2011-08-30 ENCOUNTER — Other Ambulatory Visit: Payer: Self-pay

## 2011-08-30 ENCOUNTER — Emergency Department (HOSPITAL_COMMUNITY): Payer: Medicare Other

## 2011-08-30 ENCOUNTER — Encounter (HOSPITAL_COMMUNITY): Payer: Self-pay | Admitting: Internal Medicine

## 2011-08-30 DIAGNOSIS — S72002A Fracture of unspecified part of neck of left femur, initial encounter for closed fracture: Secondary | ICD-10-CM | POA: Diagnosis present

## 2011-08-30 DIAGNOSIS — D649 Anemia, unspecified: Secondary | ICD-10-CM | POA: Diagnosis present

## 2011-08-30 DIAGNOSIS — S72143A Displaced intertrochanteric fracture of unspecified femur, initial encounter for closed fracture: Principal | ICD-10-CM

## 2011-08-30 LAB — URINALYSIS, ROUTINE W REFLEX MICROSCOPIC
Bilirubin Urine: NEGATIVE
Glucose, UA: NEGATIVE mg/dL
Ketones, ur: NEGATIVE mg/dL
Leukocytes, UA: NEGATIVE
Nitrite: NEGATIVE
Specific Gravity, Urine: 1.01 (ref 1.005–1.030)
pH: 7 (ref 5.0–8.0)

## 2011-08-30 LAB — TYPE AND SCREEN
Unit division: 0
Unit division: 0

## 2011-08-30 LAB — BASIC METABOLIC PANEL
Chloride: 100 mEq/L (ref 96–112)
GFR calc Af Amer: 84 mL/min — ABNORMAL LOW (ref >90)
GFR calc non Af Amer: 72 mL/min — ABNORMAL LOW (ref >90)
Potassium: 4.3 mEq/L (ref 3.5–5.1)
Sodium: 134 mEq/L — ABNORMAL LOW (ref 135–145)

## 2011-08-30 LAB — CARDIAC PANEL(CRET KIN+CKTOT+MB+TROPI)
CK, MB: 1.8 ng/mL (ref 0.3–4.0)
Relative Index: INVALID (ref 0.0–2.5)
Total CK: 25 U/L (ref 7–177)
Troponin I: <0.3 ng/mL (ref <0.30)

## 2011-08-30 LAB — URINE CULTURE
Colony Count: 50000
Culture  Setup Time: 201301101400

## 2011-08-30 LAB — APTT: aPTT: 33 seconds (ref 24–37)

## 2011-08-30 LAB — URINE MICROSCOPIC-ADD ON

## 2011-08-30 LAB — CBC
HCT: 34.2 % — ABNORMAL LOW (ref 36.0–46.0)
MCHC: 32.7 g/dL (ref 30.0–36.0)
Platelets: 133 10*3/uL — ABNORMAL LOW (ref 150–400)
RDW: 14 % (ref 11.5–15.5)
WBC: 6.9 10*3/uL (ref 4.0–10.5)

## 2011-08-30 LAB — ABO/RH: ABO/RH(D): B POS

## 2011-08-30 LAB — HEPATIC FUNCTION PANEL
Bilirubin, Direct: 0.2 mg/dL (ref 0.0–0.3)
Indirect Bilirubin: 0.3 mg/dL (ref 0.3–0.9)
Total Protein: 6.3 g/dL (ref 6.0–8.3)

## 2011-08-30 LAB — PROTIME-INR: Prothrombin Time: 15.1 seconds (ref 11.6–15.2)

## 2011-08-30 LAB — SURGICAL PCR SCREEN: MRSA, PCR: NEGATIVE

## 2011-08-30 MED ORDER — SODIUM CHLORIDE 0.9 % IV SOLN
Freq: Once | INTRAVENOUS | Status: AC
  Administered 2011-08-30: 02:00:00 via INTRAVENOUS

## 2011-08-30 MED ORDER — POTASSIUM CHLORIDE IN NACL 20-0.9 MEQ/L-% IV SOLN
INTRAVENOUS | Status: DC
  Administered 2011-08-30 (×2): via INTRAVENOUS

## 2011-08-30 MED ORDER — ACETAMINOPHEN 325 MG PO TABS: 650.0000 mg | ORAL_TABLET | Freq: Four times a day (QID) | ORAL | Status: DC | PRN

## 2011-08-30 MED ORDER — ONDANSETRON HCL 4 MG PO TABS: 4.0000 mg | ORAL_TABLET | Freq: Four times a day (QID) | ORAL | Status: DC | PRN

## 2011-08-30 MED ORDER — HYDROMORPHONE HCL PF 1 MG/ML IJ SOLN: 0.5000 mg | INTRAMUSCULAR | Status: DC | PRN

## 2011-08-30 MED ORDER — SODIUM CHLORIDE 0.9 % IV SOLN: INTRAVENOUS | Status: DC

## 2011-08-30 MED ORDER — PANTOPRAZOLE SODIUM 40 MG IV SOLR
40.0000 mg | Freq: Every day | INTRAVENOUS | Status: DC
  Administered 2011-08-30 – 2011-09-01 (×3): 40 mg via INTRAVENOUS
  Filled 2011-08-30 (×3): qty 40

## 2011-08-30 MED ORDER — ONDANSETRON HCL 4 MG/2ML IJ SOLN
4.0000 mg | Freq: Once | INTRAMUSCULAR | Status: AC
  Administered 2011-08-30: 4 mg via INTRAVENOUS
  Filled 2011-08-30: qty 2

## 2011-08-30 MED ORDER — METOPROLOL TARTRATE 1 MG/ML IV SOLN
10.0000 mg | Freq: Four times a day (QID) | INTRAVENOUS | Status: DC
  Administered 2011-08-30 – 2011-09-02 (×13): 10 mg via INTRAVENOUS
  Filled 2011-08-30 (×13): qty 10

## 2011-08-30 MED ORDER — CHLORHEXIDINE GLUCONATE 4 % EX LIQD
60.0000 mL | Freq: Once | CUTANEOUS | Status: AC
  Administered 2011-08-31: 4 via TOPICAL
  Filled 2011-08-30: qty 60

## 2011-08-30 MED ORDER — MORPHINE SULFATE 4 MG/ML IJ SOLN: 4.0000 mg | INTRAMUSCULAR | Status: DC | PRN

## 2011-08-30 MED ORDER — ACETAMINOPHEN 650 MG RE SUPP: 650.0000 mg | Freq: Four times a day (QID) | RECTAL | Status: DC | PRN

## 2011-08-30 MED ORDER — LEVOTHYROXINE SODIUM 100 MCG IV SOLR
50.0000 ug | Freq: Every day | INTRAVENOUS | Status: DC
  Administered 2011-08-30 – 2011-09-02 (×4): 50 ug via INTRAVENOUS
  Filled 2011-08-30 (×5): qty 2.5

## 2011-08-30 MED ORDER — INFLUENZA VIRUS VACC SPLIT PF IM SUSP
0.5000 mL | INTRAMUSCULAR | Status: AC
  Filled 2011-08-30: qty 0.5

## 2011-08-30 MED ORDER — HYDROMORPHONE HCL PF 1 MG/ML IJ SOLN
0.5000 mg | INTRAMUSCULAR | Status: DC | PRN
  Administered 2011-08-31 – 2011-09-01 (×4): 1 mg via INTRAVENOUS
  Filled 2011-08-30 (×5): qty 1

## 2011-08-30 MED ORDER — ONDANSETRON HCL 4 MG/2ML IJ SOLN: 4.0000 mg | INTRAMUSCULAR | Status: DC | PRN

## 2011-08-30 MED ORDER — DEXTROSE 5 % IV SOLN
1.0000 g | INTRAVENOUS | Status: DC
  Administered 2011-08-30 – 2011-08-31 (×2): 1 g via INTRAVENOUS
  Filled 2011-08-30 (×5): qty 10

## 2011-08-30 MED ORDER — MORPHINE SULFATE 4 MG/ML IJ SOLN
4.0000 mg | Freq: Once | INTRAMUSCULAR | Status: AC
  Administered 2011-08-30: 4 mg via INTRAVENOUS
  Filled 2011-08-30: qty 1

## 2011-08-30 MED ORDER — CEFAZOLIN SODIUM-DEXTROSE 2-3 GM-% IV SOLR: 2.0000 g | INTRAVENOUS | Status: DC

## 2011-08-30 NOTE — H&P (Signed)
PCP:   Esmond Harps, MD   Chief Complaint:  Fall, about 7 PM last night  HPI: Allison Collier is an 78 y.o. female.  Lives with her daughter; osteoporosis, hypertension, hyperlipidemia coronary artery disease status post stenting about 8 years; history of esophageal cancer, status post surgical reconstruction now with esophageal stricture; believes she tripped over a mat at home and had a fall witnessed by her daughter. Brought to the emergency room and found to have left intertrochanteric fracture.  Patient denies any chest pain shortness of breath or palpitation rounding the fall; but says she's been having episodic headaches since falling and hitting her head some days ago.  Denies fever dysuria nausea or vomiting.  Does not much physical activity, and cigarette short of breath just walking down the driveway to her car; does not climb stairs. Chronic lower extremity varicose veins.    Rewiew of Systems:  The patient denies anorexia, fever, weight loss,, vision loss, decreased hearing,  chest pain, syncope, dyspnea on exertion, peripheral edema, balance deficits, hemoptysis, abdominal pain, melena, hematochezia, severe indigestion/heartburn, hematuria, incontinence, genital sores, muscle weakness, suspicious skin lesions, transient blindness, depression, unusual weight change, abnormal bleeding, enlarged lymph nodes, angioedema, and breast masses.    Past Medical History  Diagnosis Date  . GERD (gastroesophageal reflux disease)   . Thyroid disease   . Hypertension   . Hyperlipidemia   . Osteoporosis     No past surgical history on file.  Medications:  HOME MEDS: Prior to Admission medications   Medication Sig Start Date End Date Taking? Authorizing Provider  acetaminophen-codeine (TYLENOL #3) 300-30 MG per tablet Take 1 tablet by mouth every 12 (twelve) hours as needed. with food   Yes Historical Provider, MD  alendronate (FOSAMAX) 70 MG tablet Take 70 mg by mouth every 7  (seven) days. Take with a full glass of water on an empty stomach.    Yes Historical Provider, MD  aspirin EC 81 MG tablet Take 81 mg by mouth at bedtime.   Yes Historical Provider, MD  ezetimibe (ZETIA) 10 MG tablet Take 10 mg by mouth daily.     Yes Historical Provider, MD  levothyroxine (SYNTHROID, LEVOTHROID) 100 MCG tablet Take 100 mcg by mouth daily.     Yes Historical Provider, MD  lisinopril (PRINIVIL,ZESTRIL) 40 MG tablet Take 40 mg by mouth daily.     Yes Historical Provider, MD  metoprolol (TOPROL-XL) 100 MG 24 hr tablet Take 100 mg by mouth daily.     Yes Historical Provider, MD  nitroGLYCERIN (NITROSTAT) 0.4 MG SL tablet Place 0.4 mg under the tongue every 5 (five) minutes as needed. For chest pains   Yes Historical Provider, MD  ranitidine (ZANTAC) 150 MG tablet Take 150 mg by mouth daily.    Yes Historical Provider, MD  simvastatin (ZOCOR) 80 MG tablet Take 80 mg by mouth at bedtime.   Yes Historical Provider, MD     Allergies:  Allergies  Allergen Reactions  . Sulfa Antibiotics     Social History:   does not have a smoking history on file. She does not have any smokeless tobacco history on file. Her alcohol and drug histories not on file.  Family History: No family history on file. 2 sisters diabetic   Physical Exam: Filed Vitals:   08/29/11 2202 08/30/11 0437  BP: 183/72 153/74  Pulse: 80 95  Temp: 97.8 F (36.6 C)   TempSrc: Oral   Resp: 18 12  Height: 5\' 7"  (1.702 m)  Weight: 58.968 kg (130 lb)   SpO2: 97% 96%   Blood pressure 153/74, pulse 95, temperature 97.8 F (36.6 C), temperature source Oral, resp. rate 12, height 5\' 7"  (1.702 m), weight 58.968 kg (130 lb), SpO2 96.00%.  GEN:  Pleasant elderly Caucasian lady lying in the stretcher in no acute distress; cooperative with exam PSYCH:  alert and oriented x3; does not appear anxious does not appear depressed; affect is appropriate  HEENT: Mucous membranes pink dry and anicteric; PERRLA; surgical  pupils;EOM intact; no cervical lymphadenopathy nor thyromegaly or carotid bruit; no JVD; Breasts:: Not examined CHEST WALL: No tenderness CHEST: Normal respiration, clear to auscultation bilaterally; right upper chest scar status post remote Port-A-Cath removal HEART: Regular rate and rhythm; no murmurs rubs or gallops BACK:  no CVA tenderness ABDOMEN: Obese, soft non-tender; no masses; improved from remote long-term PEG; denies drainage; some redness around the area; no organomegaly, normal abdominal bowel sounds; no pannus; mild intertriginous candida. Rectal Exam: Not done EXTREMITIES: No bone or joint deformity; age-appropriate arthropathy of the hands and knees; no edema; no ulcerations. Genitalia: not examined PULSES: 2+ and symmetric SKIN: Normal hydration no rash or ulceration CNS: Cranial nerves 2-12 grossly intact no focal neurologic deficit   Labs & Imaging Results for orders placed during the hospital encounter of 08/29/11 (from the past 48 hour(s))  CBC     Status: Abnormal   Collection Time   08/30/11 12:16 AM      Component Value Range Comment   WBC 6.9  4.0 - 10.5 (K/uL)    RBC 3.84 (*) 3.87 - 5.11 (MIL/uL)    Hemoglobin 11.2 (*) 12.0 - 15.0 (g/dL)    HCT 40.9 (*) 81.1 - 46.0 (%)    MCV 89.1  78.0 - 100.0 (fL)    MCH 29.2  26.0 - 34.0 (pg)    MCHC 32.7  30.0 - 36.0 (g/dL)    RDW 91.4  78.2 - 95.6 (%)    Platelets 133 (*) 150 - 400 (K/uL)   BASIC METABOLIC PANEL     Status: Abnormal   Collection Time   08/30/11 12:16 AM      Component Value Range Comment   Sodium 134 (*) 135 - 145 (mEq/L)    Potassium 4.3  3.5 - 5.1 (mEq/L)    Chloride 100  96 - 112 (mEq/L)    CO2 30  19 - 32 (mEq/L)    Glucose, Bld 139 (*) 70 - 99 (mg/dL)    BUN 21  6 - 23 (mg/dL)    Creatinine, Ser 2.13  0.50 - 1.10 (mg/dL)    Calcium 9.6  8.4 - 10.5 (mg/dL)    GFR calc non Af Amer 72 (*) >90 (mL/min)    GFR calc Af Amer 84 (*) >90 (mL/min)   URINALYSIS, ROUTINE W REFLEX MICROSCOPIC      Status: Abnormal   Collection Time   08/30/11  1:43 AM      Component Value Range Comment   Color, Urine YELLOW  YELLOW     APPearance CLOUDY (*) CLEAR     Specific Gravity, Urine 1.010  1.005 - 1.030     pH 7.0  5.0 - 8.0     Glucose, UA NEGATIVE  NEGATIVE (mg/dL)    Hgb urine dipstick SMALL (*) NEGATIVE     Bilirubin Urine NEGATIVE  NEGATIVE     Ketones, ur NEGATIVE  NEGATIVE (mg/dL)    Protein, ur NEGATIVE  NEGATIVE (mg/dL)    Urobilinogen, UA  0.2  0.0 - 1.0 (mg/dL)    Nitrite NEGATIVE  NEGATIVE     Leukocytes, UA NEGATIVE  NEGATIVE    URINE MICROSCOPIC-ADD ON     Status: Abnormal   Collection Time   08/30/11  1:43 AM      Component Value Range Comment   Squamous Epithelial / LPF RARE  RARE     WBC, UA 3-6  <3 (WBC/hpf)    RBC / HPF 0-2  <3 (RBC/hpf)    Bacteria, UA MANY (*) RARE     Dg Chest 1 View  08/30/2011  *RADIOLOGY REPORT*  Clinical Data: Fall.  Left femur pain.  CHEST - 1 VIEW  Comparison: 02/23/2006.  Findings: Left lung is clear.  Cardiopericardial silhouette and mediastinal contours appear normal.  Aortic arch atherosclerosis. Bilateral pleural apical thickening is present, greater on the right than left.  Small right pleural effusion is present.  No right-sided pneumothorax.  Hazy opacity of the right chest is probably technical in nature.  There is no focal airspace consolidation.  T8 compression fracture is present which is new compared to the prior exam.  Osteopenia.  Per CMS PQRS reporting requirements (PQRS Measure 24): Given the patient's age of greater than 50 and the fracture site (hip, distal radius, or spine), the patient should be tested for osteoporosis using DXA, and the appropriate treatment considered based on the DXA results.  IMPRESSION:  1.  Small right pleural effusion.  Hazy opacity over the right hemithorax is favored to be technical.  No focal consolidation or pneumothorax.  No displaced rib fractures identified. 2.  Age indeterminant T8 compression  fracture.  This is new compared to prior radiographs of 2007.  Original Report Authenticated By: Andreas Newport, M.D.   Dg Pelvis 1-2 Views  08/30/2011  *RADIOLOGY REPORT*  Clinical Data: Fall.  Left femur pain.  PELVIS - 1-2 VIEW  Comparison: None.  Findings: Intertrochanteric left femur fracture.  Joint spaces appear symmetric.  Pelvic rings appear intact.  Partially visualized lumbar spondylosis.  IMPRESSION: Intertrochanteric left femur fracture.  Original Report Authenticated By: Andreas Newport, M.D.   Dg Femur Left  08/30/2011  *RADIOLOGY REPORT*  Clinical Data: Fall.  Left hip pain.  LEFT FEMUR - 2 VIEW  Comparison: None.  Findings: Intertrochanteric left femur fracture is present.  Medial displacement of the lesser trochanter.  Hip joint space appears preserved.  No significant osteoarthritis.  Atherosclerosis. Distal femur appears intact.  IMPRESSION: Intertrochanteric left femur fracture with medial displacement of the lesser trochanter.  Original Report Authenticated By: Andreas Newport, M.D.      Assessment Present on Admission:  .Closed left hip fracture .HTN (hypertension) .GERD (gastroesophageal reflux disease) .Hyperlipidemia .Osteoarthritis .Osteopenia .Hypothyroid .CAD (coronary artery disease) .Anemia  PLAN:  admit this lady for medical management and an orthopedic consult. Dr. Romeo Apple as ordered been informed. The history is not suggestive of an acute coronary event. It is now over 16 hours since this lady fell, will get 2 sets of cardiac enzymes 4 hours apart and that should be good enough to rule her out for an acute MI.  Because of the history of recent head injury, subsequent persistent headaches and recurrent dizziness, will get a CT scan of the brain to rule out a subdural hematoma.  This lady is not sufficiently active to confirm that she is low-risk for this surgery; so we will presume it to be high risk, and recommending proceeding with surgical repair, as  long as there is no biochemical  evidence of acute myocardial injury, as the benefits of early repair far outweigh the risks of delay; possibility of postsurgical morbidity discussed and accepted by patient and family.  Patient confirms her status is DO NOT RESUSCITATE, outside of the surgical arena.  Other plans as per orders.    Maven Rosander 08/30/2011, 4:38 AM

## 2011-08-30 NOTE — Progress Notes (Signed)
We have received and noted request for PT eval.  We plan to await Dr. Mort Sawyers consult and specific PT orders as appropriate.

## 2011-08-30 NOTE — ED Notes (Signed)
Dr. Orvan Falconer in room evaluating patient.

## 2011-08-30 NOTE — Progress Notes (Signed)
UR Chart Review Completed  

## 2011-08-30 NOTE — Progress Notes (Signed)
Subjective: This lady has no specific complaints. In fact she has not any significant pain from the left hip fracture either. She denies any chest pain, palpitations or dyspnea. She is very alert.           Physical Exam: Blood pressure 153/74, pulse 95, temperature 97.8 F (36.6 C), temperature source Oral, resp. rate 12, height 5\' 7"  (1.702 m), weight 62.823 kg (138 lb 8 oz), SpO2 96.00%. She looks systemically well. Heart sounds are present and normal in sinus rhythm clinically. Lung fields are clear. She is alert and orientated without any focal neurotic size. She clear has evidence of a fractured left hip with shortening and external rotation of the left leg.   Investigations:     Basic Metabolic Panel:  Basename 08/30/11 0536 08/30/11 0016  NA -- 134*  K -- 4.3  CL -- 100  CO2 -- 30  GLUCOSE -- 139*  BUN -- 21  CREATININE -- 0.79  CALCIUM -- 9.6  MG 2.4 --  PHOS -- --   Liver Function Tests:  [facility] 08/30/11 0536  AST 27  ALT 18  ALKPHOS 94  BILITOT 0.5  PROT 6.3  ALBUMIN 3.2*     CBC:  Basename 08/30/11 0016  WBC 6.9  NEUTROABS --  HGB 11.2*  HCT 34.2*  MCV 89.1  PLT 133*    Dg Chest 1 View  08/30/2011  *RADIOLOGY REPORT*  Clinical Data: Fall.  Left femur pain.  CHEST - 1 VIEW  Comparison: 02/23/2006.  Findings: Left lung is clear.  Cardiopericardial silhouette and mediastinal contours appear normal.  Aortic arch atherosclerosis. Bilateral pleural apical thickening is present, greater on the right than left.  Small right pleural effusion is present.  No right-sided pneumothorax.  Hazy opacity of the right chest is probably technical in nature.  There is no focal airspace consolidation.  T8 compression fracture is present which is new compared to the prior exam.  Osteopenia.  Per CMS PQRS reporting requirements (PQRS Measure 24): Given the patient's age of greater than 50 and the fracture site (hip, distal radius, or spine), the patient should be  tested for osteoporosis using DXA, and the appropriate treatment considered based on the DXA results.  IMPRESSION:  1.  Small right pleural effusion.  Hazy opacity over the right hemithorax is favored to be technical.  No focal consolidation or pneumothorax.  No displaced rib fractures identified. 2.  Age indeterminant T8 compression fracture.  This is new compared to prior radiographs of 2007.  Original Report Authenticated By: Andreas Newport, M.D.   Dg Pelvis 1-2 Views  08/30/2011  *RADIOLOGY REPORT*  Clinical Data: Fall.  Left femur pain.  PELVIS - 1-2 VIEW  Comparison: None.  Findings: Intertrochanteric left femur fracture.  Joint spaces appear symmetric.  Pelvic rings appear intact.  Partially visualized lumbar spondylosis.  IMPRESSION: Intertrochanteric left femur fracture.  Original Report Authenticated By: Andreas Newport, M.D.   Dg Femur Left  08/30/2011  *RADIOLOGY REPORT*  Clinical Data: Fall.  Left hip pain.  LEFT FEMUR - 2 VIEW  Comparison: None.  Findings: Intertrochanteric left femur fracture is present.  Medial displacement of the lesser trochanter.  Hip joint space appears preserved.  No significant osteoarthritis.  Atherosclerosis. Distal femur appears intact.  IMPRESSION: Intertrochanteric left femur fracture with medial displacement of the lesser trochanter.  Original Report Authenticated By: Andreas Newport, M.D.   Ct Head Wo Contrast  08/30/2011  *RADIOLOGY REPORT*  Clinical Data: Dizziness.  Headache.  Falls.  Left hip fracture.  CT HEAD WITHOUT CONTRAST  Technique:  Contiguous axial images were obtained from the base of the skull through the vertex without contrast.  Comparison: None.  Findings: No mass lesion, mass effect, midline shift, hydrocephalus, hemorrhage.  No acute territorial cortical ischemia/infarct. Atrophy and chronic ischemic white matter disease is present.  Atrophy is age appropriate.  Intracranial atherosclerosis.  No skull fracture.  IMPRESSION: Mild atrophy and  chronic ischemic white matter disease.  No acute intracranial abnormality.  Original Report Authenticated By: Andreas Newport, M.D.      Medications: I have reviewed the patient's current medications.  Impression: 1. Left hip fracture. 2. Hypertension. 3. Coronary artery disease, stable. 4. Hypothyroidism. 5. Gastroesophageal reflux disease. 6. Osteoporosis.     Plan: 1. Await evaluation by Dr. Romeo Apple, orthopedics for surgery. 2. We will follow medically.     LOS: 1 day   Wilson Singer Pager [phone]  08/30/2011, 11:12 AM

## 2011-08-30 NOTE — Consult Note (Signed)
Reason for Consult:fracture hip  Referring Physician: HPI Dr. Loney Laurence [name] is an 78 y.o. female.  HPI: See dictated history however the patient had a witnessed fall.  She endplates with a cane in the home.  She lives with a sister.  Past Medical History  Diagnosis Date  . GERD (gastroesophageal reflux disease)   . Thyroid disease   . Hypertension   . Hyperlipidemia   . Osteoporosis     History reviewed. No pertinent past surgical history.  History reviewed. No pertinent family history.  Social History:  reports that she has never smoked. She does not have any smokeless tobacco history on file. She reports that she does not drink alcohol or use illicit drugs.  Allergies:  Allergies  Allergen Reactions  . Sulfa Antibiotics     Medications: I have reviewed the patient's current medications.  Results for orders placed during the hospital encounter of 08/29/11 (from the past 48 hour(s))  CBC     Status: Abnormal   Collection Time   08/30/11 12:16 AM      Component Value Range Comment   WBC 6.9  4.0 - 10.5 (K/uL)    RBC 3.84 (*) 3.87 - 5.11 (MIL/uL)    Hemoglobin 11.2 (*) 12.0 - 15.0 (g/dL)    HCT 16.1 (*) 09.6 - 46.0 (%)    MCV 89.1  78.0 - 100.0 (fL)    MCH 29.2  26.0 - 34.0 (pg)    MCHC 32.7  30.0 - 36.0 (g/dL)    RDW 04.5  40.9 - 81.1 (%)    Platelets 133 (*) 150 - 400 (K/uL)   BASIC METABOLIC PANEL     Status: Abnormal   Collection Time   08/30/11 12:16 AM      Component Value Range Comment   Sodium 134 (*) 135 - 145 (mEq/L)    Potassium 4.3  3.5 - 5.1 (mEq/L)    Chloride 100  96 - 112 (mEq/L)    CO2 30  19 - 32 (mEq/L)    Glucose, Bld 139 (*) 70 - 99 (mg/dL)    BUN 21  6 - 23 (mg/dL)    Creatinine, Ser 9.14  0.50 - 1.10 (mg/dL)    Calcium 9.6  8.4 - 10.5 (mg/dL)    GFR calc non Af Amer 72 (*) >90 (mL/min)    GFR calc Af Amer 84 (*) >90 (mL/min)   URINALYSIS, ROUTINE W REFLEX MICROSCOPIC     Status: Abnormal   Collection Time   08/30/11  1:43 AM     Component Value Range Comment   Color, Urine YELLOW  YELLOW     APPearance CLOUDY (*) CLEAR     Specific Gravity, Urine 1.010  1.005 - 1.030     pH 7.0  5.0 - 8.0     Glucose, UA NEGATIVE  NEGATIVE (mg/dL)    Hgb urine dipstick SMALL (*) NEGATIVE     Bilirubin Urine NEGATIVE  NEGATIVE     Ketones, ur NEGATIVE  NEGATIVE (mg/dL)    Protein, ur NEGATIVE  NEGATIVE (mg/dL)    Urobilinogen, UA 0.2  0.0 - 1.0 (mg/dL)    Nitrite NEGATIVE  NEGATIVE     Leukocytes, UA NEGATIVE  NEGATIVE    URINE MICROSCOPIC-ADD ON     Status: Abnormal   Collection Time   08/30/11  1:43 AM      Component Value Range Comment   Squamous Epithelial / LPF RARE  RARE     WBC, UA  3-6  <3 (WBC/hpf)    RBC / HPF 0-2  <3 (RBC/hpf)    Bacteria, UA MANY (*) RARE    HEPATIC FUNCTION PANEL     Status: Abnormal   Collection Time   08/30/11  5:36 AM      Component Value Range Comment   Total Protein 6.3  6.0 - 8.3 (g/dL)    Albumin 3.2 (*) 3.5 - 5.2 (g/dL)    AST 27  0 - 37 (U/L)    ALT 18  0 - 35 (U/L)    Alkaline Phosphatase 94  39 - 117 (U/L)    Total Bilirubin 0.5  0.3 - 1.2 (mg/dL)    Bilirubin, Direct 0.2  0.0 - 0.3 (mg/dL)    Indirect Bilirubin 0.3  0.3 - 0.9 (mg/dL)   MAGNESIUM     Status: Normal   Collection Time   08/30/11  5:36 AM      Component Value Range Comment   Magnesium 2.4  1.5 - 2.5 (mg/dL)   APTT     Status: Normal   Collection Time   08/30/11  5:36 AM      Component Value Range Comment   aPTT 33  24 - 37 (seconds)   PROTIME-INR     Status: Normal   Collection Time   08/30/11  5:36 AM      Component Value Range Comment   Prothrombin Time 15.1  11.6 - 15.2 (seconds)    INR 1.17  0.00 - 1.49    TSH     Status: Normal   Collection Time   08/30/11  5:36 AM      Component Value Range Comment   TSH 4.487  0.350 - 4.500 (uIU/mL)   CARDIAC PANEL(CRET KIN+CKTOT+MB+TROPI)     Status: Normal   Collection Time   08/30/11  5:36 AM      Component Value Range Comment   Total CK 32  7 - 177 (U/L)     CK, MB 1.9  0.3 - 4.0 (ng/mL)    Troponin I <0.30  <0.30 (ng/mL)    Relative Index RELATIVE INDEX IS INVALID  0.0 - 2.5    CARDIAC PANEL(CRET KIN+CKTOT+MB+TROPI)     Status: Normal   Collection Time   08/30/11  1:48 PM      Component Value Range Comment   Total CK 25  7 - 177 (U/L)    CK, MB 1.8  0.3 - 4.0 (ng/mL)    Troponin I <0.30  <0.30 (ng/mL)    Relative Index RELATIVE INDEX IS INVALID  0.0 - 2.5    PREPARE RBC (CROSSMATCH)     Status: Normal (Preliminary result)   Collection Time   08/30/11  4:02 PM      Component Value Range Comment   Order Confirmation ORDER PROCESSED BY BLOOD BANK     TYPE AND SCREEN     Status: Normal (Preliminary result)   Collection Time   08/30/11  4:02 PM      Component Value Range Comment   ABO/RH(D) B POS      Antibody Screen PENDING      Sample Expiration 09/02/2011     ABO/RH     Status: Normal   Collection Time   08/30/11  4:02 PM      Component Value Range Comment   ABO/RH(D) B POS       Dg Chest 1 View  08/30/2011  *RADIOLOGY REPORT*  Clinical Data: Fall.  Left femur pain.  CHEST - 1 VIEW  Comparison: 02/23/2006.  Findings: Left lung is clear.  Cardiopericardial silhouette and mediastinal contours appear normal.  Aortic arch atherosclerosis. Bilateral pleural apical thickening is present, greater on the right than left.  Small right pleural effusion is present.  No right-sided pneumothorax.  Hazy opacity of the right chest is probably technical in nature.  There is no focal airspace consolidation.  T8 compression fracture is present which is new compared to the prior exam.  Osteopenia.  Per CMS PQRS reporting requirements (PQRS Measure 24): Given the patient's age of greater than 50 and the fracture site (hip, distal radius, or spine), the patient should be tested for osteoporosis using DXA, and the appropriate treatment considered based on the DXA results.  IMPRESSION:  1.  Small right pleural effusion.  Hazy opacity over the right hemithorax is  favored to be technical.  No focal consolidation or pneumothorax.  No displaced rib fractures identified. 2.  Age indeterminant T8 compression fracture.  This is new compared to prior radiographs of 2007.  Original Report Authenticated By: Andreas Newport, M.D.   Dg Pelvis 1-2 Views  08/30/2011  *RADIOLOGY REPORT*  Clinical Data: Fall.  Left femur pain.  PELVIS - 1-2 VIEW  Comparison: None.  Findings: Intertrochanteric left femur fracture.  Joint spaces appear symmetric.  Pelvic rings appear intact.  Partially visualized lumbar spondylosis.  IMPRESSION: Intertrochanteric left femur fracture.  Original Report Authenticated By: Andreas Newport, M.D.   Dg Femur Left  08/30/2011  *RADIOLOGY REPORT*  Clinical Data: Fall.  Left hip pain.  LEFT FEMUR - 2 VIEW  Comparison: None.  Findings: Intertrochanteric left femur fracture is present.  Medial displacement of the lesser trochanter.  Hip joint space appears preserved.  No significant osteoarthritis.  Atherosclerosis. Distal femur appears intact.  IMPRESSION: Intertrochanteric left femur fracture with medial displacement of the lesser trochanter.  Original Report Authenticated By: Andreas Newport, M.D.   Ct Head Wo Contrast  08/30/2011  *RADIOLOGY REPORT*  Clinical Data: Dizziness.  Headache.  Falls.  Left hip fracture.  CT HEAD WITHOUT CONTRAST  Technique:  Contiguous axial images were obtained from the base of the skull through the vertex without contrast.  Comparison: None.  Findings: No mass lesion, mass effect, midline shift, hydrocephalus, hemorrhage.  No acute territorial cortical ischemia/infarct. Atrophy and chronic ischemic white matter disease is present.  Atrophy is age appropriate.  Intracranial atherosclerosis.  No skull fracture.  IMPRESSION: Mild atrophy and chronic ischemic white matter disease.  No acute intracranial abnormality.  Original Report Authenticated By: Andreas Newport, M.D.    Review of Systems  Unable to perform ROS: mental acuity    Blood pressure 160/75, pulse 81, temperature 97.8 F (36.6 C), temperature source Oral, resp. rate 16, height 5\' 7"  (1.702 m), weight 138 lb 8 oz (62.823 kg), SpO2 94.00%. Physical Exam  Constitutional: She appears well-developed and well-nourished.  HENT:  Head: Normocephalic and atraumatic.  Eyes: Conjunctivae are normal.  Neck: Normal range of motion.  Cardiovascular: Normal rate.   Respiratory: Effort normal.  GI: Soft.  Musculoskeletal:       Right shoulder: Normal.       Left shoulder: Normal.       Right hip: Normal.       Left hip: She exhibits decreased range of motion, decreased strength, tenderness, bony tenderness, swelling and deformity.  Neurological: She is alert.  Skin: Skin is warm and dry.  Psychiatric: She has a normal mood and affect. Her behavior is normal.  Assessment/Plan:Imaging reveals intertrochanteric fracture LEFT hip LEFT hip fracture  Patient will require internal fixation  The risk and benefit ratio of surgical treatment including the complications which are potential have been reviewed.    Fuller Canada 08/30/2011, 5:19 PM

## 2011-08-30 NOTE — ED Provider Notes (Signed)
History     CSN: [id number]  Arrival date & time 08/29/11  2156   First MD Initiated Contact with Patient 08/29/11 2311      Chief Complaint  Patient presents with  . Fall  . Leg Pain    The history is provided by the patient.   the patient reports tripping and falling today while at her house.  She fell on the left side of her body in her left hip.  She denies headache.  She denies head trauma.  She denies neck pain.  She denies weakness in her upper lower extremities.  She reports pain in her left hip and left leg.  She reports she was otherwise having a normal day.  She has a history of hypertension hyperlipidemia thyroid disease and she reports a prior heart attack.  She denies fevers or chills.  She denies cough or congestion.  She denies dysuria urinary frequency.  Past Medical History  Diagnosis Date  . GERD (gastroesophageal reflux disease)   . Thyroid disease   . Hypertension   . Hyperlipidemia   . Osteoporosis     No past surgical history on file.  No family history on file.  History  Substance Use Topics  . Smoking status: Not on file  . Smokeless tobacco: Not on file  . Alcohol Use:     OB History    Grav Para Term Preterm Abortions TAB SAB Ect Mult Living                  Review of Systems  All other systems reviewed and are negative.    Allergies  Sulfa antibiotics  Home Medications   Current Outpatient Rx  Name Route Sig Dispense Refill  . ACETAMINOPHEN-CODEINE #3 300-30 MG PO TABS Oral Take 1 tablet by mouth every 12 (twelve) hours as needed. with food    . ALENDRONATE SODIUM 70 MG PO TABS Oral Take 70 mg by mouth every 7 (seven) days. Take with a full glass of water on an empty stomach.     . ASPIRIN EC 81 MG PO TBEC Oral Take 81 mg by mouth at bedtime.    Marland Kitchen EZETIMIBE 10 MG PO TABS Oral Take 10 mg by mouth daily.      Marland Kitchen LEVOTHYROXINE SODIUM 100 MCG PO TABS Oral Take 100 mcg by mouth daily.      Marland Kitchen LISINOPRIL 40 MG PO TABS Oral Take 40 mg by  mouth daily.      Marland Kitchen METOPROLOL SUCCINATE ER 100 MG PO TB24 Oral Take 100 mg by mouth daily.      Marland Kitchen NITROGLYCERIN 0.4 MG SL SUBL Sublingual Place 0.4 mg under the tongue every 5 (five) minutes as needed. For chest pains    . RANITIDINE HCL 150 MG PO TABS Oral Take 150 mg by mouth daily.     Marland Kitchen SIMVASTATIN 80 MG PO TABS Oral Take 80 mg by mouth at bedtime.      BP 183/72  Pulse 80  Temp(Src) 97.8 F (36.6 C) (Oral)  Resp 18  Ht 5\' 7"  (1.702 m)  Wt 130 lb (58.968 kg)  BMI 20.36 kg/m2  SpO2 97%  Physical Exam  Nursing note and vitals reviewed. Constitutional: She is oriented to person, place, and time. She appears well-developed and well-nourished. No distress.  HENT:  Head: Normocephalic and atraumatic.  Eyes: EOM are normal. Pupils are equal, round, and reactive to light.  Neck: Normal range of motion. Neck supple.  No C-spine tenderness  Cardiovascular: Normal rate, regular rhythm and normal heart sounds.   Pulmonary/Chest: Effort normal and breath sounds normal.  Abdominal: Soft. She exhibits no distension. There is no tenderness.  Musculoskeletal: Normal range of motion.       Pain with range of motion of the left hip.  There is no significant shortening and only mild external rotation of the left foot  Neurological: She is alert and oriented to person, place, and time.  Skin: Skin is warm and dry.  Psychiatric: She has a normal mood and affect. Judgment normal.    ED Course  Procedures (including critical care time)   Date: 08/30/2011  Rate: 80  Rhythm: normal sinus rhythm  QRS Axis: normal  Intervals: normal  ST/T Wave abnormalities: normal  Conduction Disutrbances:none  Narrative Interpretation:   Old EKG Reviewed: No significant changes noted     Labs Reviewed  CBC - Abnormal; Notable for the following:    RBC 3.84 (*)    Hemoglobin 11.2 (*)    HCT 34.2 (*)    Platelets 133 (*)    All other components within normal limits  BASIC METABOLIC PANEL -  Abnormal; Notable for the following:    Sodium 134 (*)    Glucose, Bld 139 (*)    GFR calc non Af Amer 72 (*)    GFR calc Af Amer 84 (*)    All other components within normal limits  URINALYSIS, ROUTINE W REFLEX MICROSCOPIC  URINE CULTURE   Dg Chest 1 View  08/30/2011  *RADIOLOGY REPORT*  Clinical Data: Fall.  Left femur pain.  CHEST - 1 VIEW  Comparison: 02/23/2006.  Findings: Left lung is clear.  Cardiopericardial silhouette and mediastinal contours appear normal.  Aortic arch atherosclerosis. Bilateral pleural apical thickening is present, greater on the right than left.  Small right pleural effusion is present.  No right-sided pneumothorax.  Hazy opacity of the right chest is probably technical in nature.  There is no focal airspace consolidation.  T8 compression fracture is present which is new compared to the prior exam.  Osteopenia.  Per CMS PQRS reporting requirements (PQRS Measure 24): Given the patient's age of greater than 50 and the fracture site (hip, distal radius, or spine), the patient should be tested for osteoporosis using DXA, and the appropriate treatment considered based on the DXA results.  IMPRESSION:  1.  Small right pleural effusion.  Hazy opacity over the right hemithorax is favored to be technical.  No focal consolidation or pneumothorax.  No displaced rib fractures identified. 2.  Age indeterminant T8 compression fracture.  This is new compared to prior radiographs of 2007.  Original Report Authenticated By: Andreas Newport, M.D.   Dg Pelvis 1-2 Views  08/30/2011  *RADIOLOGY REPORT*  Clinical Data: Fall.  Left femur pain.  PELVIS - 1-2 VIEW  Comparison: None.  Findings: Intertrochanteric left femur fracture.  Joint spaces appear symmetric.  Pelvic rings appear intact.  Partially visualized lumbar spondylosis.  IMPRESSION: Intertrochanteric left femur fracture.  Original Report Authenticated By: Andreas Newport, M.D.   Dg Femur Left  08/30/2011  *RADIOLOGY REPORT*  Clinical  Data: Fall.  Left hip pain.  LEFT FEMUR - 2 VIEW  Comparison: None.  Findings: Intertrochanteric left femur fracture is present.  Medial displacement of the lesser trochanter.  Hip joint space appears preserved.  No significant osteoarthritis.  Atherosclerosis. Distal femur appears intact.  IMPRESSION: Intertrochanteric left femur fracture with medial displacement of the lesser trochanter.  Original Report  Authenticated By: Andreas Newport, M.D.   I personally reviewed the xrays   1. Fracture, intertrochanteric, left femur   2. Fall       MDM  Patient had a mechanical fall and ended up with a left intratrochanteric hip fracture.  This was a mechanical fall.  She will be admitted to the medicine service overnight she is ASA class III.  I will discuss the case with the orthopedic surgeon  [facility] Dr Romeo Apple (orthopedics)       Lyanne Co, MD 08/30/11 [phone]

## 2011-08-31 ENCOUNTER — Inpatient Hospital Stay (HOSPITAL_COMMUNITY): Payer: Medicare Other | Admitting: Anesthesiology

## 2011-08-31 ENCOUNTER — Encounter (HOSPITAL_COMMUNITY): Payer: Self-pay | Admitting: Anesthesiology

## 2011-08-31 ENCOUNTER — Inpatient Hospital Stay (HOSPITAL_COMMUNITY): Payer: Medicare Other

## 2011-08-31 ENCOUNTER — Encounter (HOSPITAL_COMMUNITY): Payer: Self-pay | Admitting: *Deleted

## 2011-08-31 ENCOUNTER — Encounter (HOSPITAL_COMMUNITY)
Admission: EM | Disposition: A | Discharge status: Skilled Nursing Facility | Payer: Self-pay | Source: Home / Self Care | Attending: Internal Medicine

## 2011-08-31 HISTORY — PX: ORIF HIP FRACTURE: SHX2125

## 2011-08-31 LAB — BASIC METABOLIC PANEL
CO2: 28 mEq/L (ref 19–32)
GFR calc non Af Amer: 73 mL/min — ABNORMAL LOW (ref >90)
Glucose, Bld: 103 mg/dL — ABNORMAL HIGH (ref 70–99)
Potassium: 5.1 mEq/L (ref 3.5–5.1)
Sodium: 136 mEq/L (ref 135–145)

## 2011-08-31 LAB — CBC
Hemoglobin: 9 g/dL — ABNORMAL LOW (ref 12.0–15.0)
MCH: 28.8 pg (ref 26.0–34.0)
RBC: 3.13 MIL/uL — ABNORMAL LOW (ref 3.87–5.11)
WBC: 4.1 10*3/uL (ref 4.0–10.5)

## 2011-08-31 SURGERY — OPEN REDUCTION INTERNAL FIXATION HIP
Anesthesia: Spinal | Site: Hip | Laterality: Left | Wound class: Clean

## 2011-08-31 MED ORDER — PHENOL 1.4 % MT LIQD
1.0000 | OROMUCOSAL | Status: DC | PRN
  Administered 2011-09-01: 1 via OROMUCOSAL
  Filled 2011-08-31: qty 177

## 2011-08-31 MED ORDER — BISACODYL 5 MG PO TBEC: 5.0000 mg | DELAYED_RELEASE_TABLET | Freq: Every day | ORAL | Status: DC | PRN

## 2011-08-31 MED ORDER — ACETAMINOPHEN 325 MG PO TABS: 325.0000 mg | ORAL_TABLET | ORAL | Status: DC | PRN

## 2011-08-31 MED ORDER — CEFAZOLIN SODIUM 1-5 GM-% IV SOLN: 1.0000 g | Freq: Once | INTRAVENOUS | Status: DC

## 2011-08-31 MED ORDER — METHOCARBAMOL 100 MG/ML IJ SOLN
500.0000 mg | Freq: Four times a day (QID) | INTRAVENOUS | Status: DC | PRN
  Filled 2011-08-31: qty 5

## 2011-08-31 MED ORDER — LACTATED RINGERS IV SOLN: INTRAVENOUS | Status: DC | PRN

## 2011-08-31 MED ORDER — SODIUM CHLORIDE 0.9 % IR SOLN
Status: DC | PRN
  Administered 2011-08-31: 1000 mL

## 2011-08-31 MED ORDER — BUPIVACAINE-EPINEPHRINE PF 0.5-1:200000 % IJ SOLN
INTRAMUSCULAR | Status: DC | PRN
  Administered 2011-08-31: 60 mL

## 2011-08-31 MED ORDER — CEFAZOLIN SODIUM-DEXTROSE 2-3 GM-% IV SOLR
INTRAVENOUS | Status: AC
  Filled 2011-08-31: qty 50

## 2011-08-31 MED ORDER — PROPOFOL 10 MG/ML IV EMUL
INTRAVENOUS | Status: DC | PRN
  Administered 2011-08-31: 50 ug/kg/min via INTRAVENOUS

## 2011-08-31 MED ORDER — LIDOCAINE HCL (CARDIAC) 10 MG/ML IV SOLN
INTRAVENOUS | Status: DC | PRN
  Administered 2011-08-31: 25 mg via INTRAVENOUS

## 2011-08-31 MED ORDER — SODIUM CHLORIDE 0.9 % IV SOLN
INTRAVENOUS | Status: DC
  Administered 2011-08-31 – 2011-09-02 (×5): via INTRAVENOUS
  Administered 2011-09-03: 100 mL/h via INTRAVENOUS

## 2011-08-31 MED ORDER — SODIUM CHLORIDE 0.9 % IJ SOLN
INTRAMUSCULAR | Status: AC
  Filled 2011-08-31: qty 30

## 2011-08-31 MED ORDER — ONDANSETRON HCL 4 MG/2ML IJ SOLN: 4.0000 mg | Freq: Once | INTRAMUSCULAR | Status: DC | PRN

## 2011-08-31 MED ORDER — ONDANSETRON HCL 4 MG PO TABS: 4.0000 mg | ORAL_TABLET | Freq: Four times a day (QID) | ORAL | Status: DC | PRN

## 2011-08-31 MED ORDER — HYDROCODONE-ACETAMINOPHEN 5-325 MG PO TABS
1.0000 | ORAL_TABLET | ORAL | Status: DC | PRN
  Administered 2011-09-02 – 2011-09-03 (×3): 1 via ORAL
  Filled 2011-08-31 (×3): qty 1

## 2011-08-31 MED ORDER — ONDANSETRON HCL 4 MG/2ML IJ SOLN: 4.0000 mg | Freq: Four times a day (QID) | INTRAMUSCULAR | Status: DC | PRN

## 2011-08-31 MED ORDER — FENTANYL CITRATE 0.05 MG/ML IJ SOLN
INTRAMUSCULAR | Status: DC | PRN
  Administered 2011-08-31: 20 ug via INTRATHECAL

## 2011-08-31 MED ORDER — METOCLOPRAMIDE HCL 10 MG PO TABS
5.0000 mg | ORAL_TABLET | Freq: Three times a day (TID) | ORAL | Status: DC | PRN
  Filled 2011-08-31: qty 2

## 2011-08-31 MED ORDER — PROPOFOL 10 MG/ML IV EMUL
INTRAVENOUS | Status: AC
  Filled 2011-08-31: qty 20

## 2011-08-31 MED ORDER — ACETAMINOPHEN 325 MG PO TABS: 650.0000 mg | ORAL_TABLET | Freq: Four times a day (QID) | ORAL | Status: DC | PRN

## 2011-08-31 MED ORDER — DOCUSATE SODIUM 100 MG PO CAPS
100.0000 mg | ORAL_CAPSULE | Freq: Two times a day (BID) | ORAL | Status: DC
  Administered 2011-08-31 – 2011-09-03 (×7): 100 mg via ORAL
  Filled 2011-08-31 (×7): qty 1

## 2011-08-31 MED ORDER — MENTHOL 3 MG MT LOZG: 1.0000 | LOZENGE | OROMUCOSAL | Status: DC | PRN

## 2011-08-31 MED ORDER — BUPIVACAINE IN DEXTROSE 0.75-8.25 % IT SOLN
INTRATHECAL | Status: AC
  Filled 2011-08-31: qty 2

## 2011-08-31 MED ORDER — LACTATED RINGERS IV SOLN: INTRAVENOUS | Status: DC

## 2011-08-31 MED ORDER — GLYCOPYRROLATE 0.2 MG/ML IJ SOLN
0.2000 mg | Freq: Once | INTRAMUSCULAR | Status: AC | PRN
  Administered 2011-08-31: 0.2 mg via INTRAVENOUS

## 2011-08-31 MED ORDER — BUPIVACAINE HCL 0.75 % IJ SOLN
INTRAMUSCULAR | Status: DC | PRN
  Administered 2011-08-31: 1.5 mL via INTRATHECAL

## 2011-08-31 MED ORDER — ALUM & MAG HYDROXIDE-SIMETH 200-200-20 MG/5ML PO SUSP: 30.0000 mL | ORAL | Status: DC | PRN

## 2011-08-31 MED ORDER — ACETAMINOPHEN 650 MG RE SUPP: 650.0000 mg | Freq: Four times a day (QID) | RECTAL | Status: DC | PRN

## 2011-08-31 MED ORDER — METHOCARBAMOL 500 MG PO TABS: 500.0000 mg | ORAL_TABLET | Freq: Four times a day (QID) | ORAL | Status: DC | PRN

## 2011-08-31 MED ORDER — FENTANYL CITRATE 0.05 MG/ML IJ SOLN
INTRAMUSCULAR | Status: AC
  Filled 2011-08-31: qty 2

## 2011-08-31 MED ORDER — INFLUENZA VIRUS VACC SPLIT PF IM SUSP
0.5000 mL | INTRAMUSCULAR | Status: AC
  Administered 2011-09-01: 0.5 mL via INTRAMUSCULAR
  Filled 2011-08-31: qty 0.5

## 2011-08-31 MED ORDER — SODIUM CHLORIDE 0.9 % IV SOLN
INTRAVENOUS | Status: DC
  Administered 2011-08-31: 12:00:00 via INTRAVENOUS

## 2011-08-31 MED ORDER — FENTANYL CITRATE 0.05 MG/ML IJ SOLN: 25.0000 ug | INTRAMUSCULAR | Status: DC | PRN

## 2011-08-31 MED ORDER — EPHEDRINE SULFATE 50 MG/ML IJ SOLN
INTRAMUSCULAR | Status: AC
  Filled 2011-08-31: qty 1

## 2011-08-31 MED ORDER — CEFAZOLIN SODIUM 1-5 GM-% IV SOLN
INTRAVENOUS | Status: AC
  Filled 2011-08-31: qty 50

## 2011-08-31 MED ORDER — MIDAZOLAM HCL 2 MG/2ML IJ SOLN
1.0000 mg | INTRAMUSCULAR | Status: DC | PRN
  Administered 2011-08-31: 1 mg via INTRAVENOUS

## 2011-08-31 MED ORDER — GLYCOPYRROLATE 0.2 MG/ML IJ SOLN
INTRAMUSCULAR | Status: AC
  Administered 2011-08-31: 0.2 mg via INTRAVENOUS
  Filled 2011-08-31: qty 1

## 2011-08-31 MED ORDER — METOCLOPRAMIDE HCL 5 MG/ML IJ SOLN: 5.0000 mg | Freq: Three times a day (TID) | INTRAMUSCULAR | Status: DC | PRN

## 2011-08-31 MED ORDER — CEFAZOLIN SODIUM 1-5 GM-% IV SOLN
INTRAVENOUS | Status: DC | PRN
  Administered 2011-08-31: 1 g via INTRAVENOUS

## 2011-08-31 MED ORDER — MIDAZOLAM HCL 2 MG/2ML IJ SOLN
INTRAMUSCULAR | Status: AC
  Administered 2011-08-31: 1 mg via INTRAVENOUS
  Filled 2011-08-31: qty 2

## 2011-08-31 MED ORDER — SENNOSIDES-DOCUSATE SODIUM 8.6-50 MG PO TABS: 1.0000 | ORAL_TABLET | Freq: Every evening | ORAL | Status: DC | PRN

## 2011-08-31 MED ORDER — SODIUM CHLORIDE 0.9 % IV SOLN
INTRAVENOUS | Status: DC | PRN
  Administered 2011-08-31: 12:00:00 via INTRAVENOUS

## 2011-08-31 MED ORDER — CEFAZOLIN SODIUM 1-5 GM-% IV SOLN
1.0000 g | Freq: Four times a day (QID) | INTRAVENOUS | Status: AC
  Administered 2011-08-31 – 2011-09-01 (×3): 1 g via INTRAVENOUS
  Filled 2011-08-31 (×3): qty 50

## 2011-08-31 MED ORDER — BUPIVACAINE-EPINEPHRINE PF 0.5-1:200000 % IJ SOLN
INTRAMUSCULAR | Status: AC
  Filled 2011-08-31: qty 20

## 2011-08-31 MED ORDER — MAGNESIUM CITRATE PO SOLN: 1.0000 | Freq: Once | ORAL | Status: AC | PRN

## 2011-08-31 SURGICAL SUPPLY — 52 items
BAG HAMPER (MISCELLANEOUS) ×2 IMPLANT
BANDAGE GAUZE ELAST BULKY 4 IN (GAUZE/BANDAGES/DRESSINGS) ×2 IMPLANT
BIT DRILL AO GAMMA 4.2X300 (BIT) ×2 IMPLANT
BLADE SURG SZ10 CARB STEEL (BLADE) ×4 IMPLANT
CHLORAPREP W/TINT 26ML (MISCELLANEOUS) ×2 IMPLANT
CLOTH BEACON ORANGE TIMEOUT ST (SAFETY) ×2 IMPLANT
COVER LIGHT HANDLE STERIS (MISCELLANEOUS) ×4 IMPLANT
COVER MAYO STAND XLG (DRAPE) ×2 IMPLANT
DECANTER SPIKE VIAL GLASS SM (MISCELLANEOUS) ×2 IMPLANT
DRAPE STERI IOBAN 125X83 (DRAPES) ×2 IMPLANT
DRESSING ALLEVYN BORDER 5X5 (GAUZE/BANDAGES/DRESSINGS) ×2 IMPLANT
DRSG MEPILEX BORDER 4X12 (GAUZE/BANDAGES/DRESSINGS) ×2 IMPLANT
ELECT REM PT RETURN 9FT ADLT (ELECTROSURGICAL) ×2
ELECTRODE REM PT RTRN 9FT ADLT (ELECTROSURGICAL) ×1 IMPLANT
GLOVE ECLIPSE 6.5 STRL STRAW (GLOVE) ×4 IMPLANT
GLOVE ECLIPSE 7.0 STRL STRAW (GLOVE) ×6 IMPLANT
GLOVE INDICATOR 7.0 STRL GRN (GLOVE) ×2 IMPLANT
GLOVE SKINSENSE NS SZ8.0 LF (GLOVE) ×1
GLOVE SKINSENSE STRL SZ8.0 LF (GLOVE) ×1 IMPLANT
GLOVE SS BIOGEL STRL SZ 6.5 (GLOVE) ×2 IMPLANT
GLOVE SS N UNI LF 8.5 STRL (GLOVE) ×4 IMPLANT
GLOVE SUPERSENSE BIOGEL SZ 6.5 (GLOVE) ×2
GOWN STRL REIN XL XLG (GOWN DISPOSABLE) ×6 IMPLANT
GUIDEROD T2 3X1000 (ROD) ×2 IMPLANT
HEMOSTAT SURGICEL 4X8 (HEMOSTASIS) ×2 IMPLANT
INST SET MAJOR BONE (KITS) ×2 IMPLANT
K-WIRE  3.2X450M STR (WIRE) ×1
K-WIRE 3.2X450M STR (WIRE) ×1
KIT BLADEGUARD II DBL (SET/KITS/TRAYS/PACK) ×2 IMPLANT
KIT ROOM TURNOVER AP CYSTO (KITS) ×2 IMPLANT
KWIRE 3.2X450M STR (WIRE) ×1 IMPLANT
MANIFOLD NEPTUNE II (INSTRUMENTS) ×2 IMPLANT
MARKER SKIN DUAL TIP RULER LAB (MISCELLANEOUS) ×2 IMPLANT
NAIL TROCH GAMMA 11X18 (Nail) ×2 IMPLANT
NEEDLE HYPO 21X1.5 SAFETY (NEEDLE) ×2 IMPLANT
NEEDLE SPNL 18GX3.5 QUINCKE PK (NEEDLE) ×2 IMPLANT
NS IRRIG 1000ML POUR BTL (IV SOLUTION) ×2 IMPLANT
PACK BASIC III (CUSTOM PROCEDURE TRAY) ×1
PACK SRG BSC III STRL LF ECLPS (CUSTOM PROCEDURE TRAY) ×1 IMPLANT
PENCIL HANDSWITCHING (ELECTRODE) ×2 IMPLANT
SCREW LAG GAMMA 3 TI 10.5X105M (Screw) ×2 IMPLANT
SCREW LOCKING T2 F/T  5X37.5MM (Screw) ×1 IMPLANT
SCREW LOCKING T2 F/T 5X37.5MM (Screw) ×1 IMPLANT
SET BASIN LINEN APH (SET/KITS/TRAYS/PACK) ×2 IMPLANT
SPONGE GAUZE 4X4 12PLY (GAUZE/BANDAGES/DRESSINGS) ×2 IMPLANT
SPONGE LAP 18X18 X RAY DECT (DISPOSABLE) ×6 IMPLANT
STAPLER VISISTAT 35W (STAPLE) ×2 IMPLANT
SUT MON AB 0 CT1 (SUTURE) ×4 IMPLANT
SUT MON AB 2-0 CT1 36 (SUTURE) ×4 IMPLANT
SYR 30ML LL (SYRINGE) ×2 IMPLANT
SYR BULB IRRIGATION 50ML (SYRINGE) ×4 IMPLANT
YANKAUER SUCT 12FT TUBE ARGYLE (SUCTIONS) ×2 IMPLANT

## 2011-08-31 NOTE — Anesthesia Preprocedure Evaluation (Signed)
Anesthesia Evaluation  Patient identified by MRN, date of birth, ID band  Reviewed: Allergy & Precautions, H&P , NPO status , Patient's Chart, lab work & pertinent test results, reviewed documented beta blocker date and time   Airway Mallampati: I TM Distance: >3 FB Neck ROM: Full    Dental  (+) Edentulous Upper and Edentulous Lower   Pulmonary neg pulmonary ROS,    Pulmonary exam normal       Cardiovascular hypertension, Pt. on medications and Pt. on home beta blockers + CAD Regular Normal    Neuro/Psych Negative Neurological ROS  Negative Psych ROS   GI/Hepatic GERD-  Medicated,  Endo/Other  Hypothyroidism   Renal/GU   Genitourinary negative   Musculoskeletal  (+) Arthritis -, Osteoarthritis,    Abdominal Normal abdominal exam  (+)   Peds  Hematology  (+) Blood dyscrasia, anemia ,   Anesthesia Other Findings   Reproductive/Obstetrics                           Anesthesia Physical Anesthesia Plan  ASA: III  Anesthesia Plan: Spinal   Post-op Pain Management:    Induction:   Airway Management Planned: Simple Face Mask  Additional Equipment:   Intra-op Plan:   Post-operative Plan:   Informed Consent: I have reviewed the patients History and Physical, chart, labs and discussed the procedure including the risks, benefits and alternatives for the proposed anesthesia with the patient or authorized representative who has indicated his/her understanding and acceptance.     Plan Discussed with: CRNA  Anesthesia Plan Comments:         Anesthesia Quick Evaluation

## 2011-08-31 NOTE — Progress Notes (Signed)
Subjective: This lady has no specific complaints. She has done well overnight. She was seen by Dr. Romeo Apple, orthopedics and she is scheduled for surgery today. She denies any dyspnea, cough or palpitations. There has been no fever.           Physical Exam: Blood pressure 161/65, pulse 78, temperature 97.7 F (36.5 C), temperature source Oral, resp. rate 18, height 5\' 7"  (1.702 m), weight 65.2 kg (143 lb 11.8 oz), SpO2 93.00%. She looks systemically well. Heart sounds are present and normal in sinus rhythm clinically. Lung fields are clear. She is alert and orientated without any focal neurotic size. She clear has evidence of a fractured left hip with shortening and external rotation of the left leg.   Investigations:     Basic Metabolic Panel:  Basename 08/31/11 0445 08/30/11 0536 08/30/11 0016  NA 136 -- 134*  K 5.1 -- 4.3  CL 105 -- 100  CO2 28 -- 30  GLUCOSE 103* -- 139*  BUN 15 -- 21  CREATININE 0.75 -- 0.79  CALCIUM 9.0 -- 9.6  MG -- 2.4 --  PHOS -- -- --   Liver Function Tests:  [facility] 08/30/11 0536  AST 27  ALT 18  ALKPHOS 94  BILITOT 0.5  PROT 6.3  ALBUMIN 3.2*     CBC:  Basename 08/31/11 0445 08/30/11 0016  WBC 4.1 6.9  NEUTROABS -- --  HGB 9.0* 11.2*  HCT 27.8* 34.2*  MCV 88.8 89.1  PLT 102* 133*    Dg Chest 1 View  08/30/2011  *RADIOLOGY REPORT*  Clinical Data: Fall.  Left femur pain.  CHEST - 1 VIEW  Comparison: 02/23/2006.  Findings: Left lung is clear.  Cardiopericardial silhouette and mediastinal contours appear normal.  Aortic arch atherosclerosis. Bilateral pleural apical thickening is present, greater on the right than left.  Small right pleural effusion is present.  No right-sided pneumothorax.  Hazy opacity of the right chest is probably technical in nature.  There is no focal airspace consolidation.  T8 compression fracture is present which is new compared to the prior exam.  Osteopenia.  Per CMS PQRS reporting requirements (PQRS  Measure 24): Given the patient's age of greater than 50 and the fracture site (hip, distal radius, or spine), the patient should be tested for osteoporosis using DXA, and the appropriate treatment considered based on the DXA results.  IMPRESSION:  1.  Small right pleural effusion.  Hazy opacity over the right hemithorax is favored to be technical.  No focal consolidation or pneumothorax.  No displaced rib fractures identified. 2.  Age indeterminant T8 compression fracture.  This is new compared to prior radiographs of 2007.  Original Report Authenticated By: Andreas Newport, M.D.   Dg Pelvis 1-2 Views  08/30/2011  *RADIOLOGY REPORT*  Clinical Data: Fall.  Left femur pain.  PELVIS - 1-2 VIEW  Comparison: None.  Findings: Intertrochanteric left femur fracture.  Joint spaces appear symmetric.  Pelvic rings appear intact.  Partially visualized lumbar spondylosis.  IMPRESSION: Intertrochanteric left femur fracture.  Original Report Authenticated By: Andreas Newport, M.D.   Dg Femur Left  08/30/2011  *RADIOLOGY REPORT*  Clinical Data: Fall.  Left hip pain.  LEFT FEMUR - 2 VIEW  Comparison: None.  Findings: Intertrochanteric left femur fracture is present.  Medial displacement of the lesser trochanter.  Hip joint space appears preserved.  No significant osteoarthritis.  Atherosclerosis. Distal femur appears intact.  IMPRESSION: Intertrochanteric left femur fracture with medial displacement of the lesser trochanter.  Original Report  Authenticated By: Andreas Newport, M.D.   Ct Head Wo Contrast  08/30/2011  *RADIOLOGY REPORT*  Clinical Data: Dizziness.  Headache.  Falls.  Left hip fracture.  CT HEAD WITHOUT CONTRAST  Technique:  Contiguous axial images were obtained from the base of the skull through the vertex without contrast.  Comparison: None.  Findings: No mass lesion, mass effect, midline shift, hydrocephalus, hemorrhage.  No acute territorial cortical ischemia/infarct. Atrophy and chronic ischemic white matter  disease is present.  Atrophy is age appropriate.  Intracranial atherosclerosis.  No skull fracture.  IMPRESSION: Mild atrophy and chronic ischemic white matter disease.  No acute intracranial abnormality.  Original Report Authenticated By: Andreas Newport, M.D.      Medications: I have reviewed the patient's current medications.  Impression: 1. Left hip fracture. 2. Hypertension. 3. Coronary artery disease, stable. 4. Hypothyroidism. 5. Gastroesophageal reflux disease. 6. Osteoporosis. 7. Anemia, likely related to blood loss from the hip fracture.     Plan: 1. Left hip surgery today. 2. Will likely need blood transfusion depending on hemoglobin tomorrow morning. I anticipate a fairly uneventful course and disposition will be to a skilled nursing facility for rehabilitation.     LOS: 2 days   Wilson Singer Pager [phone]  08/31/2011, 7:58 AM

## 2011-08-31 NOTE — Preoperative (Signed)
Beta Blockers   Reason not to administer Beta Blockers:Not Applicable 

## 2011-08-31 NOTE — Anesthesia Postprocedure Evaluation (Signed)
  Anesthesia Post-op Note  Patient: Allison Collier  Procedure(s) Performed:  OPEN REDUCTION INTERNAL FIXATION HIP - ORIF/Gamma Nail Left Hip  Patient Location: PACU  Anesthesia Type: Spinal  Level of Consciousness: awake, alert , oriented and patient cooperative  Airway and Oxygen Therapy: Patient Spontanous Breathing and Patient connected to face mask oxygen  Post-op Pain: none  Post-op Assessment: Post-op Vital signs reviewed, Patient's Cardiovascular Status Stable, Respiratory Function Stable, Patent Airway, No signs of Nausea or vomiting and Pain level controlled  Post-op Vital Signs: Reviewed and stable  Complications: No apparent anesthesia complications

## 2011-08-31 NOTE — Op Note (Signed)
08/31/2011  2:06 PM  PATIENT:  Allison Collier  78 y.o. female  PRE-OPERATIVE DIAGNOSIS:  left hip fracture  POST-OPERATIVE DIAGNOSIS:  left hip fracture  PROCEDURE:  Procedure(s): OPEN REDUCTION INTERNAL FIXATION HIP GAMMA NAIL SHORT 125 DEGREE 105 MM ACORN SLIDE 37.5 LOCK SCREW DYNAMIC HOLE   SURGEON:  Surgeon(s): Fuller Canada, MD  PHYSICIAN ASSISTANT:   ASSISTANTS: Valetta Close   ANESTHESIA:   spinal  EBL:  Total I/O In: 3300 [I.V.:2700; Blood:600] Out: 550 [Urine:450; Blood:100]  BLOOD ADMINISTERED:1 unit of packed red cells was running as the case started CC PRBC  DRAINS: none   LOCAL MEDICATIONS USED:  MARCAINE with epinephrine 60 CC  SPECIMEN:  No Specimen  DISPOSITION OF SPECIMEN:  N/A  COUNTS:  YES  TOURNIQUET:  * No tourniquets in log *  DICTATION: .Dragon Dictation  PLAN OF CARE: Admit to inpatient   PATIENT DISPOSITION:  PACU - hemodynamically stable.   Delay start of Pharmacological VTE agent (>24hrs) due to surgical blood loss or risk of bleeding: YES  The patient was identified in the preop area the left hip start site was marked for surgery the chart was reviewed and updated consent was signed  The patient was started on her antibiotic taken back to the operating room where she had a spinal anesthetic.  The patient was placed on the fracture table the left leg was placed in a traction device the right leg was placed in a well-leg holder  Preliminary timeout was completed  Radiographs was taken in AP and lateral plane with the C-arm to confirm a stable reduction.  The limb was then prepped and draped sterilely and the timeout procedure was completed  A lateral incision was made over the trochanter extended proximally. The incision was taken down to the fascia which was split in line with the skin incision. The muscles of the gluteus medius were bluntly divided and the greater trochanter was identified by palpation. A curved awl was  passed through the trochanter into the femoral canal followed by a guidewire through the cannulated awl. Once this was confirmed to be in position the cannulated reamer was passed over the guidewire and then 125 nail was passed over the wire.  A small incision was made over the lateral femur the cannula was passed to the bone, a drill was used to perforate the cortex and then the guidewire was placed in the Center of the femoral head. This was confirmed by AP and lateral x-ray.  The wire was measured. The measurement was 105 mm. The reamer was set for 105 and passed over the guidewire. The 105 mm lag screw was passed over the guidewire and the guidewire was removed. The acorn was placed proximally and then the turn was reversed and one quarter distance.  This confirmed engagement of the lag screw.  A separate stab wound was made and the cannula was passed through the alignment guide and then the drill was used to drill the hole for the distal locking screw which measured 37.5 mm and this was placed and confirmed to be in position on x-ray  Final x-rays confirmed fracture reduction and hardware placement to be in good position  Sterile saline was used to irrigate the wounds and then the proximal wound was closed with 2 layers of 0 Monocryl and one layer of 2-0 Monocryl  Staples reapproximated skin edges distal to wounds were closed with 2-0 Monocryl and staples.  The Marcaine was injected in the subfascial layer and  the subcutaneous layer divided and 30 cc each  The patient was returned to the recovery room in stable condition  Postoperative protocol weightbearing as tolerated. Lovenox needs to be delayed for 48 hours as the patient was OOZing quite a bit.

## 2011-08-31 NOTE — Clinical Documentation Improvement (Signed)
Anemia Blood Loss Clarification  THIS DOCUMENT IS NOT A PERMANENT PART OF THE MEDICAL RECORD  RESPOND TO THE THIS QUERY, FOLLOW THE INSTRUCTIONS BELOW:  1. If needed, update documentation for the patient's encounter via the notes activity.  2. Access this query again and click edit on the In [employer].  3. After updating, or not, click F2 to complete all highlighted (required) fields concerning your review. Select "additional documentation in the medical record" OR "no additional documentation provided".  4. Click Sign note button.  5. The deficiency will fall out of your In Basket *Please let us know if you are not able to complete this workflow by phone or e-mail (listed below).        08/31/11  Dear Dr. Karilyn Cota Marton Redwood  In an effort to better capture your patient's severity of illness, reflect appropriate length of stay and utilization of resources, a review of the patient medical record has revealed the following indicators.    Based on your clinical judgment, please clarify and document in a progress note and/or discharge summary the clinical condition associated with the following supporting information:  In responding to this query please exercise your independent judgment.  The fact that a query is asked, does not imply that any particular answer is desired or expected.  Possible Clinical Conditions?   " Expected Acute Blood Loss Anemia  " Acute Blood Loss Anemia  " Acute on chronic blood loss anemia  " Chronic blood loss anemia  " Precipitous drop in Hematocrit  " Other Condition________________  " Cannot Clinically Determine  Clinical Information:  Risk Factors: Left femur fracture awaiting surgery Anemia, likely related to blood loss from the hip fracture.  Diagnostics: H&H on admit: 1/10=11.2/34.2 Pre-OP H&H: 1/11=9.0/27.8 Treatments: Transfusion: Serial H&H monitoring Per progress note=Left hip surgery today (1/11). Will likely need blood  transfusion depending on hemoglobin tomorrow morning (1/12).    Reviewed:  no additional documentation provided  Thank You,  Harless Litten RN, MSN Clinical Documentation Specialist: Office# [phone] [facility] Information Management [place]

## 2011-08-31 NOTE — Anesthesia Procedure Notes (Addendum)
Procedure Name: MAC Date/Time: 08/31/2011 12:12 PM Performed by: Carolyne Littles, Crescencio Jozwiak Pre-anesthesia Checklist: Patient identified, Timeout performed, Emergency Drugs available, Suction available and Patient being monitored Oxygen Delivery Method: Simple face mask   Spinal  End time: 08/31/2011 12:32 PM Staffing CRNA/Resident: ANDRAZA, Alleah Dearman Preanesthetic Checklist Completed: patient identified, site marked, surgical consent, pre-op evaluation, timeout performed, IV checked, risks and benefits discussed and monitors and equipment checked Spinal Block Patient position: left lateral decubitus Prep: Betadine Patient monitoring: heart rate, cardiac monitor, continuous pulse ox and blood pressure Approach: left paramedian Location: L2-3 Injection technique: single-shot Needle Needle type: Spinocan  Needle gauge: 22 G Needle length: 9 cm Assessment Sensory level: T10 Additional Notes 1232 Spinal placed.  .75%Sensorcaine 1.5 cc with Fentanyl 20 mcg; Patient tolerated well.  Lot [id number] Exp 2013-11

## 2011-08-31 NOTE — Transfer of Care (Signed)
Immediate Anesthesia Transfer of Care Note  Patient: Allison Collier  Procedure(s) Performed:  OPEN REDUCTION INTERNAL FIXATION HIP - ORIF/Gamma Nail Left Hip  Patient Location: PACU  Anesthesia Type: Spinal  Level of Consciousness: awake, alert , oriented and patient cooperative  Airway & Oxygen Therapy: Patient Spontanous Breathing and Patient connected to face mask oxygen  Post-op Assessment: Report given to PACU RN and Post -op Vital signs reviewed and stable  Post vital signs: Reviewed and stable Filed Vitals:   08/31/11 1421  BP: 128/63  Pulse:   Temp: 36.5 C  Resp:     Complications: No apparent anesthesia complications

## 2011-08-31 NOTE — Addendum Note (Signed)
Addendum  created 08/31/11 1525 by Sherril Heyward, MD   Modules edited:Anesthesia Attestations    

## 2011-08-31 NOTE — Interval H&P Note (Signed)
History and Physical Interval Note:  08/31/2011 11:08 AM  Allison Collier  has presented today for surgery, with the diagnosis of fracture hip  The various methods of treatment have been discussed with the patient and family. After consideration of risks, benefits and other options for treatment, the patient has consented to  Procedure(s): OPEN REDUCTION INTERNAL FIXATION LEFT HIP as a surgical intervention .  The patients' history has been reviewed, patient examined, no change in status, stable for surgery.  I have reviewed the patients' chart and labs.  Questions were answered to the patient's satisfaction.     Fuller Canada

## 2011-08-31 NOTE — Addendum Note (Signed)
Addendum  created 08/31/11 1525 by Roselie Awkward, MD   Modules edited:Anesthesia Attestations

## 2011-08-31 NOTE — Brief Op Note (Addendum)
08/29/2011 - 08/31/2011  2:06 PM  PATIENT:  Allison Collier  78 y.o. female  PRE-OPERATIVE DIAGNOSIS:  left hip fracture  POST-OPERATIVE DIAGNOSIS:  left hip fracture  PROCEDURE:  Procedure(s): OPEN REDUCTION INTERNAL FIXATION HIP GAMMA NAIL SHORT 125 DEGREE 105 MM ACORN SLIDE 37.5 LOCK SCREW DYNAMIC HOLE   SURGEON:  Surgeon(s): Fuller Canada, MD  PHYSICIAN ASSISTANT:   ASSISTANTS: Valetta Close   ANESTHESIA:   spinal  EBL:  Total I/O In: 3300 [I.V.:2700; Blood:600] Out: 550 [Urine:450; Blood:100]  BLOOD ADMINISTERED:1 unit of packed red cells was running as the case started CC PRBC  DRAINS: none   LOCAL MEDICATIONS USED:  MARCAINE with epinephrine 60 CC  SPECIMEN:  No Specimen  DISPOSITION OF SPECIMEN:  N/A  COUNTS:  YES  TOURNIQUET:  * No tourniquets in log *  DICTATION: .Dragon Dictation  PLAN OF CARE: Admit to inpatient   PATIENT DISPOSITION:  PACU - hemodynamically stable.   Delay start of Pharmacological VTE agent (>24hrs) due to surgical blood loss or risk of bleeding: YES  The patient was identified in the preop area the left hip start site was marked for surgery the chart was reviewed and updated consent was signed  The patient was started on her antibiotic taken back to the operating room where she had a spinal anesthetic.  The patient was placed on the fracture table the left leg was placed in a traction device the right leg was placed in a well-leg holder  Preliminary timeout was completed  Radiographs was taken in AP and lateral plane with the C-arm to confirm a stable reduction.  The limb was then prepped and draped sterilely and the timeout procedure was completed  A lateral incision was made over the trochanter extended proximally. The incision was taken down to the fascia which was split in line with the skin incision. The muscles of the gluteus medius were bluntly divided and the greater trochanter was identified by palpation. A  curved awl was passed through the trochanter into the femoral canal followed by a guidewire through the cannulated awl. Once this was confirmed to be in position the cannulated reamer was passed over the guidewire and then 125 nail was passed over the wire.  A small incision was made over the lateral femur the cannula was passed to the bone, a drill was used to perforate the cortex and then the guidewire was placed in the Center of the femoral head. This was confirmed by AP and lateral x-ray.  The wire was measured. The measurement was 105 mm. The reamer was set for 105 and passed over the guidewire. The 105 mm lag screw was passed over the guidewire and the guidewire was removed. The acorn was placed proximally and then the turn was reversed and one quarter distance.  This confirmed engagement of the lag screw.  A separate stab wound was made and the cannula was passed through the alignment guide and then the drill was used to drill the hole for the distal locking screw which measured 37.5 mm and this was placed and confirmed to be in position on x-ray  Final x-rays confirmed fracture reduction and hardware placement to be in good position  Sterile saline was used to irrigate the wounds and then the proximal wound was closed with 2 layers of 0 Monocryl and one layer of 2-0 Monocryl  Staples reapproximated skin edges distal to wounds were closed with 2-0 Monocryl and staples.  The Marcaine was injected in the subfascial  layer and the subcutaneous layer divided and 30 cc each  The patient was returned to the recovery room in stable condition  Postoperative protocol weightbearing as tolerated. Lovenox needs to be delayed for 48 hours as the patient was OOZing quite a bit.

## 2011-09-01 LAB — BASIC METABOLIC PANEL
Calcium: 8.8 mg/dL (ref 8.4–10.5)
Chloride: 105 mEq/L (ref 96–112)
Creatinine, Ser: 0.85 mg/dL (ref 0.50–1.10)
GFR calc Af Amer: 69 mL/min — ABNORMAL LOW (ref >90)
GFR calc non Af Amer: 59 mL/min — ABNORMAL LOW (ref >90)

## 2011-09-01 LAB — CBC
MCHC: 32.1 g/dL (ref 30.0–36.0)
MCV: 88.5 fL (ref 78.0–100.0)
Platelets: 97 10*3/uL — ABNORMAL LOW (ref 150–400)
RDW: 14.4 % (ref 11.5–15.5)
WBC: 6.2 10*3/uL (ref 4.0–10.5)

## 2011-09-01 NOTE — Addendum Note (Signed)
Addendum  created 09/01/11 1749 by Corena Pilgrim, CRNA   Modules edited:Notes Section

## 2011-09-01 NOTE — Progress Notes (Signed)
Subjective: No new complaints.  Objective: Vital signs in last 24 hours: Temp:  [97.2 F (36.2 C)-99.3 F (37.4 C)] 97.6 F (36.4 C) (01/12 1355) Pulse Rate:  [77-95] 80  (01/12 1355) Resp:  [12-20] 18  (01/12 1355) BP: (107-150)/(52-76) 149/55 mmHg (01/12 1355) SpO2:  [92 %-99 %] 94 % (01/12 1355) Weight:  [72.576 kg (160 lb)] 72.576 kg (160 lb) (01/12 0500) Weight change: 7.375 kg (16 lb 4.2 oz) Last BM Date: 08/29/11  Intake/Output from previous day: 01/11 0701 - 01/12 0700 In: 3600 [P.O.:200; I.V.:2800; Blood:600] Out: 655 [Urine:555; Blood:100] Total I/O In: 250 [I.V.:250] Out: -    Physical Exam: General: Alert, awake, oriented x3, in no acute distress. HEENT: No bruits, no goiter. Heart: Regular rate and rhythm, without murmurs, rubs, gallops. Lungs: Clear to auscultation bilaterally. Abdomen: Soft, nontender, nondistended, positive bowel sounds. Extremities: No clubbing cyanosis or edema with positive pedal pulses. Neuro: Grossly intact, nonfocal.    Lab Results: Basic Metabolic Panel:  Basename 09/01/11 0630 08/31/11 0445 08/30/11 0536  NA 135 136 --  K 4.7 5.1 --  CL 105 105 --  CO2 27 28 --  GLUCOSE 120* 103* --  BUN 19 15 --  CREATININE 0.85 0.75 --  CALCIUM 8.8 9.0 --  MG -- -- 2.4  PHOS -- -- --   Liver Function Tests:  Basename 08/30/11 0536  AST 27  ALT 18  ALKPHOS 94  BILITOT 0.5  PROT 6.3  ALBUMIN 3.2*   No results found for this basename: LIPASE:2,AMYLASE:2 in the last 72 hours No results found for this basename: AMMONIA:2 in the last 72 hours CBC:  Basename 09/01/11 0630 08/31/11 0445  WBC 6.2 4.1  NEUTROABS -- --  HGB 7.9* 9.0*  HCT 24.6* 27.8*  MCV 88.5 88.8  PLT 97* 102*   Cardiac Enzymes:  Basename 08/30/11 1348 08/30/11 0536  CKTOTAL 25 32  CKMB 1.8 1.9  CKMBINDEX -- --  TROPONINI <0.30 <0.30   BNP: No results found for this basename: PROBNP:3 in the last 72 hours D-Dimer: No results found for this basename:  DDIMER:2 in the last 72 hours CBG: No results found for this basename: GLUCAP:6 in the last 72 hours Hemoglobin A1C: No results found for this basename: HGBA1C in the last 72 hours Fasting Lipid Panel: No results found for this basename: CHOL,HDL,LDLCALC,TRIG,CHOLHDL,LDLDIRECT in the last 72 hours Thyroid Function Tests:  Basename 08/30/11 0536  TSH 4.487  T4TOTAL --  Domingo Cocking --  T3FREE --  THYROIDAB --   Anemia Panel: No results found for this basename: VITAMINB12,FOLATE,FERRITIN,TIBC,IRON,RETICCTPCT in the last 72 hours Coagulation:  Basename 08/30/11 0536  LABPROT 15.1  INR 1.17   Urine Drug Screen: Drugs of Abuse  No results found for this basename: labopia, cocainscrnur, labbenz, amphetmu, thcu, labbarb    Alcohol Level: No results found for this basename: ETH:2 in the last 72 hours  Recent Results (from the past 240 hour(s))  URINE CULTURE     Status: Normal   Collection Time   08/30/11  1:43 AM      Component Value Range Status Comment   Specimen Description URINE, CATHETERIZED   Final    Special Requests NONE   Final    Setup Time [id number]   Final    Colony Count 50,000 COLONIES/ML   Final    Culture ESCHERICHIA COLI   Final    Report Status 09/01/2011 FINAL   Final    Organism ID, Bacteria ESCHERICHIA COLI   Final  SURGICAL PCR SCREEN     Status: Normal   Collection Time   08/30/11  4:06 PM      Component Value Range Status Comment   MRSA, PCR NEGATIVE  NEGATIVE  Final    Staphylococcus aureus NEGATIVE  NEGATIVE  Final     Studies/Results: Dg Hip Operative Left  08/31/2011  *RADIOLOGY REPORT*  Clinical Data: Left hip fracture.  OPERATIVE LEFT HIP  Comparison: 08/30/2011  Findings: Dynamic hip screw fixation across the left intertrochanteric fracture.  Lesser trochanter remains mildly displaced.  Otherwise near anatomic alignment.  IMPRESSION: Internal fixation of the left intertrochanteric fracture.  Original Report Authenticated By: Cyndie Chime,  M.D.    Medications: Scheduled Meds:   . bupivacaine 0.75% in dextrose 8.25% (intrathecal)      . ceFAZolin      . ceFAZolin      . ceFAZolin (ANCEF) IV  1 g Intravenous Q6H  . docusate sodium  100 mg Oral BID  . ePHEDrine      . fentaNYL      . influenza  inactive virus vaccine  0.5 mL Intramuscular Tomorrow-1000  . levothyroxine  50 mcg Intravenous Daily  . metoprolol  10 mg Intravenous Q6H  . pantoprazole (PROTONIX) IV  40 mg Intravenous QHS  . propofol      . sodium chloride      . DISCONTD: ceFAZolin (ANCEF) IV  1 g Intravenous Once  . DISCONTD: cefTRIAXone (ROCEPHIN)  IV  1 g Intravenous Q24H   Continuous Infusions:   . sodium chloride 100 mL/hr at 09/01/11 0935  . DISCONTD: sodium chloride 50 mL/hr at 08/31/11 1137  . DISCONTD: 0.9 % NaCl with KCl 20 mEq / L Stopped (08/31/11 1120)  . DISCONTD: lactated ringers     PRN Meds:.acetaminophen, acetaminophen, alum & mag hydroxide-simeth, bisacodyl, HYDROcodone-acetaminophen, HYDROmorphone, magnesium citrate, menthol-cetylpyridinium, methocarbamol(ROBAXIN) IV, methocarbamol, metoCLOPramide (REGLAN) injection, metoCLOPramide, ondansetron (ZOFRAN) IV, ondansetron (ZOFRAN) IV, ondansetron, ondansetron, phenol, senna-docusate, DISCONTD: acetaminophen, DISCONTD: acetaminophen, DISCONTD: acetaminophen, DISCONTD: fentaNYL DISCONTD: midazolam, DISCONTD: ondansetron (ZOFRAN) IV  Assessment/Plan:  Active Problems:  HTN (hypertension)  Hyperlipidemia  Osteoarthritis  GERD (gastroesophageal reflux disease)  Osteopenia  CAD (coronary artery disease)  Hypothyroid  Closed left hip fracture  Anemia  Plan:  Patient was transfused 1 unit of prbc today Anemia likely related to surgery Follow up cbc tomorrow Continue PT Will need SNF placement, ask CSW to assist   LOS: 3 days   Tequila Rottmann [facility] 09/01/2011, 3:23 PM

## 2011-09-01 NOTE — Anesthesia Postprocedure Evaluation (Signed)
  Anesthesia Post-op Note  Patient: Allison Collier  Procedure(s) Performed:  OPEN REDUCTION INTERNAL FIXATION HIP - ORIF/Gamma Nail Left Hip  Patient Location: Room 339  Anesthesia Type: Spinal  Level of Consciousness: awake and alert  Airway and Oxygen Therapy: Room Air  Post-op Pain: mild  Post-op Assessment: Post-op Vital signs reviewed, Patient's Cardiovascular Status Stable, Respiratory Function Stable, Patent Airway and Pain level controlled  Post-op Vital Signs: Reviewed and stable  Complications: No apparent anesthesia complications

## 2011-09-01 NOTE — Progress Notes (Signed)
Physical Therapy Evaluation Patient Details Name: Allison Collier MRN: [id number] DOB: 03/19/1921 Today's Date: 09/01/2011  Problem List:  Patient Active Problem List  Diagnoses  . HTN (hypertension)  . Hyperlipidemia  . Osteoarthritis  . GERD (gastroesophageal reflux disease)  . Osteopenia  . CAD (coronary artery disease)  . Hypothyroid  . Chronic back pain  . Esophageal cancer  . Closed left hip fracture  . Anemia    Past Medical History:  Past Medical History  Diagnosis Date  . GERD (gastroesophageal reflux disease)   . Thyroid disease   . Hypertension   . Hyperlipidemia   . Osteoporosis    Past Surgical History:  Past Surgical History  Procedure Date  . Esophagogatroduodenoscopy      PT Assessment/Plan/Recommendation PT Assessment Clinical Impression Statement: very pleasant but drowsy pt who ambulates with cane at home...only able to tolerate ther ex to LEs today due to drowsiness...will progress to transfers OOB tomorrow, WBAT L...she will need SNF at d/c PT Recommendation/Assessment: Patient will need skilled PT in the acute care venue PT Problem List: Decreased strength;Decreased range of motion;Decreased activity tolerance;Decreased mobility;Decreased cognition;Decreased knowledge of use of DME;Decreased safety awareness;Decreased knowledge of precautions;Pain Barriers to Discharge: None PT Therapy Diagnosis : Difficulty walking;Abnormality of gait;Acute pain PT Plan PT Frequency: Min 5X/week PT Treatment/Interventions: Gait training;Functional mobility training;Therapeutic activities;Therapeutic exercise;Patient/family education PT Recommendation Follow Up Recommendations: Skilled nursing facility Equipment Recommended: Defer to next venue PT Goals  Acute Rehab PT Goals PT Goal Formulation: With family Time For Goal Achievement: 2 weeks Pt will go Supine/Side to Sit: with max assist Pt will go Sit to Supine/Side: with max assist Pt will go Sit to  Stand: with max assist Pt will Transfer Bed to Chair/Chair to Bed: with max assist Pt will Ambulate: 1 - 15 feet;with max assist;with rolling walker  PT Evaluation Precautions/Restrictions  Precautions Precautions: Fall Required Braces or Orthoses: No Restrictions Weight Bearing Restrictions: Yes LLE Weight Bearing: Weight bearing as tolerated Prior Functioning  Home Living Lives With: Daughter Receives Help From: Family Type of Home: Apartment Home Layout: One level Home Access: Level entry Prior Function Level of Independence: Independent with basic ADLs;Independent with gait;Requires assistive device for independence;Independent with transfers Driving: No Vocation: Retired Financial risk analyst Arousal/Alertness: Lethargic Overall Cognitive Status: History of cognitive impairments History of Cognitive Impairment: Appears at baseline functioning Orientation Level: Oriented X4 Sensation/Coordination Sensation Light Touch: Appears Intact Stereognosis: Not tested Hot/Cold: Not tested Proprioception: Not tested Extremity Assessment RLE Assessment RLE Assessment: Within Functional Limits LLE Assessment LLE Assessment: Not tested Mobility (including Balance) Bed Mobility Bed Mobility: No Transfers Transfers: No Ambulation/Gait Ambulation/Gait: No Stairs: No Wheelchair Mobility Wheelchair Mobility: No  Balance Balance Assessed: No Exercise  General Exercises - Lower Extremity Ankle Circles/Pumps: AROM;Both;10 reps;Supine Heel Slides: AAROM;Both;10 reps;Supine Hip ABduction/ADduction: AAROM;Both;10 reps;Supine End of Session PT - End of Session Activity Tolerance: Patient limited by fatigue Patient left: in bed;with call bell in reach;with bed alarm set;with family/visitor present Nurse Communication: Mobility status for transfers General Behavior During Session: Lethargic Cognition: WFL for tasks performed  Konrad Penta 09/01/2011, 11:03 AM

## 2011-09-02 LAB — CBC
HCT: 25.4 % — ABNORMAL LOW (ref 36.0–46.0)
Hemoglobin: 8.6 g/dL — ABNORMAL LOW (ref 12.0–15.0)
MCH: 29.3 pg (ref 26.0–34.0)
MCHC: 33.9 g/dL (ref 30.0–36.0)
MCV: 86.4 fL (ref 78.0–100.0)
Platelets: 92 K/uL — ABNORMAL LOW (ref 150–400)
RBC: 2.94 MIL/uL — ABNORMAL LOW (ref 3.87–5.11)
RDW: 15.8 % — ABNORMAL HIGH (ref 11.5–15.5)
WBC: 5.4 K/uL (ref 4.0–10.5)

## 2011-09-02 LAB — URINALYSIS, ROUTINE W REFLEX MICROSCOPIC
Bilirubin Urine: NEGATIVE
Glucose, UA: NEGATIVE mg/dL
Protein, ur: NEGATIVE mg/dL

## 2011-09-02 LAB — BASIC METABOLIC PANEL WITH GFR
BUN: 20 mg/dL (ref 6–23)
CO2: 27 meq/L (ref 19–32)
Calcium: 8.4 mg/dL (ref 8.4–10.5)
Chloride: 107 meq/L (ref 96–112)
Creatinine, Ser: 0.78 mg/dL (ref 0.50–1.10)
GFR calc Af Amer: 84 mL/min — ABNORMAL LOW (ref >90)
GFR calc non Af Amer: 73 mL/min — ABNORMAL LOW (ref >90)
Glucose, Bld: 100 mg/dL — ABNORMAL HIGH (ref 70–99)
Potassium: 4.6 meq/L (ref 3.5–5.1)
Sodium: 136 meq/L (ref 135–145)

## 2011-09-02 LAB — URINE MICROSCOPIC-ADD ON

## 2011-09-02 LAB — PREPARE RBC (CROSSMATCH)

## 2011-09-02 MED ORDER — LEVOTHYROXINE SODIUM 100 MCG PO TABS
100.0000 ug | ORAL_TABLET | Freq: Every day | ORAL | Status: DC
  Administered 2011-09-03: 100 ug via ORAL
  Filled 2011-09-02: qty 1

## 2011-09-02 MED ORDER — PANTOPRAZOLE SODIUM 40 MG PO TBEC
40.0000 mg | DELAYED_RELEASE_TABLET | Freq: Every day | ORAL | Status: DC
  Filled 2011-09-02: qty 1

## 2011-09-02 MED ORDER — PANTOPRAZOLE SODIUM 40 MG PO PACK
40.0000 mg | PACK | Freq: Every day | ORAL | Status: DC
  Administered 2011-09-02 – 2011-09-03 (×2): 40 mg via ORAL
  Filled 2011-09-02 (×4): qty 20

## 2011-09-02 MED ORDER — METOPROLOL SUCCINATE ER 50 MG PO TB24
100.0000 mg | ORAL_TABLET | Freq: Every day | ORAL | Status: DC
  Administered 2011-09-02 – 2011-09-03 (×2): 100 mg via ORAL
  Filled 2011-09-02 (×2): qty 2

## 2011-09-02 NOTE — Progress Notes (Signed)
Subjective: Family reports that patient sleeping more today.  She awakens to voice, denies any complaints at this time.  No shortness of breath or cough.  Objective: Vital signs in last 24 hours: Temp:  [97.8 F (36.6 C)-98.8 F (37.1 C)] 97.8 F (36.6 C) (01/13 1406) Pulse Rate:  [81-92] 86  (01/13 1406) Resp:  [18] 18  (01/13 1406) BP: (145-160)/(55-66) 151/56 mmHg (01/13 1406) SpO2:  [92 %-99 %] 99 % (01/13 1406) Weight:  [74 kg (163 lb 2.3 oz)] 74 kg (163 lb 2.3 oz) (01/13 0500) Weight change: 1.425 kg (3 lb 2.3 oz) Last BM Date: 08/29/11  Intake/Output from previous day: 01/12 0701 - 01/13 0700 In: 4265.4 [P.O.:200; I.V.:3720; Blood:335.4; IV Piggyback:10] Out: 1000 [Urine:1000] Total I/O In: 420 [P.O.:420] Out: -    Physical Exam: General: Alert, awake, oriented x3, in no acute distress. HEENT: No bruits, no goiter. Heart: Regular rate and rhythm, without murmurs, rubs, gallops. Lungs: Clear to auscultation bilaterally. Abdomen: Soft, nontender, nondistended, positive bowel sounds. Extremities: No clubbing cyanosis or edema with positive pedal pulses. Neuro: Grossly intact, nonfocal.    Lab Results: Basic Metabolic Panel:  Basename 09/02/11 0401 09/01/11 0630  NA 136 135  K 4.6 4.7  CL 107 105  CO2 27 27  GLUCOSE 100* 120*  BUN 20 19  CREATININE 0.78 0.85  CALCIUM 8.4 8.8  MG -- --  PHOS -- --   Liver Function Tests: No results found for this basename: AST:2,ALT:2,ALKPHOS:2,BILITOT:2,PROT:2,ALBUMIN:2 in the last 72 hours No results found for this basename: LIPASE:2,AMYLASE:2 in the last 72 hours No results found for this basename: AMMONIA:2 in the last 72 hours CBC:  Basename 09/02/11 0401 09/01/11 0630  WBC 5.4 6.2  NEUTROABS -- --  HGB 8.6* 7.9*  HCT 25.4* 24.6*  MCV 86.4 88.5  PLT 92* 97*   Cardiac Enzymes: No results found for this basename: CKTOTAL:3,CKMB:3,CKMBINDEX:3,TROPONINI:3 in the last 72 hours BNP: No results found for this  basename: PROBNP:3 in the last 72 hours D-Dimer: No results found for this basename: DDIMER:2 in the last 72 hours CBG: No results found for this basename: GLUCAP:6 in the last 72 hours Hemoglobin A1C: No results found for this basename: HGBA1C in the last 72 hours Fasting Lipid Panel: No results found for this basename: CHOL,HDL,LDLCALC,TRIG,CHOLHDL,LDLDIRECT in the last 72 hours Thyroid Function Tests: No results found for this basename: TSH,T4TOTAL,FREET4,T3FREE,THYROIDAB in the last 72 hours Anemia Panel: No results found for this basename: VITAMINB12,FOLATE,FERRITIN,TIBC,IRON,RETICCTPCT in the last 72 hours Coagulation: No results found for this basename: LABPROT:2,INR:2 in the last 72 hours Urine Drug Screen: Drugs of Abuse  No results found for this basename: labopia, cocainscrnur, labbenz, amphetmu, thcu, labbarb    Alcohol Level: No results found for this basename: ETH:2 in the last 72 hours  Recent Results (from the past 240 hour(s))  URINE CULTURE     Status: Normal   Collection Time   08/30/11  1:43 AM      Component Value Range Status Comment   Specimen Description URINE, CATHETERIZED   Final    Special Requests NONE   Final    Setup Time [id number]   Final    Colony Count 50,000 COLONIES/ML   Final    Culture ESCHERICHIA COLI   Final    Report Status 09/01/2011 FINAL   Final    Organism ID, Bacteria ESCHERICHIA COLI   Final   SURGICAL PCR SCREEN     Status: Normal   Collection Time   08/30/11  4:06 PM      Component Value Range Status Comment   MRSA, PCR NEGATIVE  NEGATIVE  Final    Staphylococcus aureus NEGATIVE  NEGATIVE  Final     Studies/Results: No results found.  Medications: Scheduled Meds:   . docusate sodium  100 mg Oral BID  . levothyroxine  100 mcg Oral QAC breakfast  . metoprolol  10 mg Intravenous Q6H  . pantoprazole sodium  40 mg Oral Q1200  . DISCONTD: levothyroxine  50 mcg Intravenous Daily  . DISCONTD: pantoprazole  40 mg Oral Q1200    . DISCONTD: pantoprazole (PROTONIX) IV  40 mg Intravenous QHS   Continuous Infusions:   . sodium chloride 100 mL/hr at 09/02/11 0812   PRN Meds:.acetaminophen, acetaminophen, alum & mag hydroxide-simeth, bisacodyl, HYDROcodone-acetaminophen, HYDROmorphone, menthol-cetylpyridinium, methocarbamol(ROBAXIN) IV, methocarbamol, metoCLOPramide (REGLAN) injection, metoCLOPramide, ondansetron (ZOFRAN) IV, ondansetron (ZOFRAN) IV, ondansetron, ondansetron, phenol, senna-docusate  Assessment/Plan:  Active Problems:  HTN (hypertension)  Hyperlipidemia  Osteoarthritis  GERD (gastroesophageal reflux disease)  Osteopenia  CAD (coronary artery disease)  Hypothyroid  Closed left hip fracture  Anemia  Plan:  Patient ordered another 2 units prbc, follow up cbc in am, no signs of acute bleeding Continue gentle hydration for now, follow up volume status tomorrow Check urinalysis and limit pain meds so as not to make lethargy worse Continue PT Needs SNF placement   LOS: 4 days   Yuliet Needs [facility] 09/02/2011, 3:47 PM

## 2011-09-02 NOTE — Progress Notes (Signed)
Physical Therapy Treatment Patient Details Name: Allison Collier MRN: [id number] DOB: 1920-08-26 Today's Date: 09/02/2011 Time: 3:35-4:25 Charge: therapeutic activity 45 min PT Assessment/Plan  PT - Assessment/Plan Comments on Treatment Session: Pt tolerated well to treatment, able to complete bed exercises with cueing for proper technique and stand pivot transfer with max assistance.   Equipment Recommended: Other (comment) (rolling walker) PT Goals     PT Treatment Precautions/Restrictions  Precautions Precautions: Fall Required Braces or Orthoses: No Restrictions Weight Bearing Restrictions: Yes LLE Weight Bearing: Weight bearing as tolerated Mobility (including Balance) Bed Mobility Bed Mobility: Yes Rolling Right: 2: Max assist Rolling Right Details (indicate cue type and reason): vcing for hand placement Right Sidelying to Sit: 2: Max assist Right Sidelying to Sit Details (indicate cue type and reason): vcing for hand placement Supine to Sit: 2: Max assist Supine to Sit Details (indicate cue type and reason): vcing for hand placement Sitting - Scoot to Edge of Bed: 2: Max assist Sitting - Scoot to [place] of Bed Details (indicate cue type and reason): vcing for hand placement to assist with scooting Transfers Transfers: Yes Stand Pivot Transfers: 2: Max Actuary Details (indicate cue type and reason): vcing for hand placement Ambulation/Gait Ambulation/Gait: No Stairs: No Wheelchair Mobility Wheelchair Mobility: No    Exercise  General Exercises - Lower Extremity Ankle Circles/Pumps: AROM;Both;10 reps;Supine Quad Sets: AROM;Both;10 reps;Supine Heel Slides: AAROM;Both;10 reps;Supine Hip ABduction/ADduction: AAROM;Both;10 reps;Supine End of Session PT - End of Session Equipment Utilized During Treatment: Gait belt Activity Tolerance: Patient tolerated treatment well Patient left: in chair;with call bell in reach;with family/visitor  present Nurse Communication: Mobility status for transfers General Behavior During Session: [facility] for tasks performed Cognition: [facility] for tasks performed  Juel Burrow 09/02/2011, 4:22 PM

## 2011-09-02 NOTE — Progress Notes (Signed)
Subjective: 2 Days Post-Op Procedure(s) (LRB): OPEN REDUCTION INTERNAL FIXATION HIP (Left) Patient reports pain as mild.    Objective: Vital signs in last 24 hours: Temp:  [97.2 F (36.2 C)-98.8 F (37.1 C)] 98.2 F (36.8 C) (01/13 0647) Pulse Rate:  [77-92] 81  (01/13 0647) Resp:  [18] 18  (01/13 0647) BP: (118-160)/(55-67) 160/55 mmHg (01/13 0647) SpO2:  [92 %-99 %] 97 % (01/13 0647) Weight:  [74 kg (163 lb 2.3 oz)] 74 kg (163 lb 2.3 oz) (01/13 0500)  Intake/Output from previous day: 01/12 0701 - 01/13 0700 In: 4265.4 [P.O.:200; I.V.:3720; Blood:335.4; IV Piggyback:10] Out: 1000 [Urine:1000] Intake/Output this shift:     Basename 09/02/11 0401 09/01/11 0630 08/31/11 0445  HGB 8.6* 7.9* 9.0*    Basename 09/02/11 0401 09/01/11 0630  WBC 5.4 6.2  RBC 2.94* 2.78*  HCT 25.4* 24.6*  PLT 92* 97*    Basename 09/02/11 0401 09/01/11 0630  NA 136 135  K 4.6 4.7  CL 107 105  CO2 27 27  BUN 20 19  CREATININE 0.78 0.85  GLUCOSE 100* 120*  CALCIUM 8.4 8.8   No results found for this basename: LABPT:2,INR:2 in the last 72 hours  Neurologically intact Neurovascular intact Sensation intact distally Intact pulses distally Dorsiflexion/Plantar flexion intact  Assessment/Plan:  Acute blood loss anemia post op  2 Days Post-Op Procedure(s) (LRB): OPEN REDUCTION INTERNAL FIXATION HIP (Left) Advance diet, PT if tolerated   Fuller Canada 09/02/2011, 9:19 AM

## 2011-09-02 NOTE — Progress Notes (Signed)
Subjective: 2 Days Post-Op Procedure(s) (LRB): OPEN REDUCTION INTERNAL FIXATION HIP (Left) Patient reports pain as mild.    Objective: Vital signs in last 24 hours: Temp:  [97.2 F (36.2 C)-98.8 F (37.1 C)] 98.2 F (36.8 C) (01/13 0647) Pulse Rate:  [78-92] 81  (01/13 0647) Resp:  [18] 18  (01/13 0647) BP: (140-160)/(55-66) 160/55 mmHg (01/13 0647) SpO2:  [92 %-97 %] 97 % (01/13 0647) Weight:  [74 kg (163 lb 2.3 oz)] 74 kg (163 lb 2.3 oz) (01/13 0500)  Intake/Output from previous day: 01/12 0701 - 01/13 0700 In: 4265.4 [P.O.:200; I.V.:3720; Blood:335.4; IV Piggyback:10] Out: 1000 [Urine:1000] Intake/Output this shift:     Basename 09/02/11 0401 09/01/11 0630 08/31/11 0445  HGB 8.6* 7.9* 9.0*    Basename 09/02/11 0401 09/01/11 0630  WBC 5.4 6.2  RBC 2.94* 2.78*  HCT 25.4* 24.6*  PLT 92* 97*    Basename 09/02/11 0401 09/01/11 0630  NA 136 135  K 4.6 4.7  CL 107 105  CO2 27 27  BUN 20 19  CREATININE 0.78 0.85  GLUCOSE 100* 120*  CALCIUM 8.4 8.8   No results found for this basename: LABPT:2,INR:2 in the last 72 hours  Neurologically intact Neurovascular intact Sensation intact distally TRANSFUSE 2 UNITS   Assessment/Plan: 2 Days Post-Op Procedure(s) (LRB): OPEN REDUCTION INTERNAL FIXATION HIP (Left) Advance diet TRANSFUSE 2 U   Fuller Canada 09/02/2011, 12:49 PM

## 2011-09-03 DIAGNOSIS — N390 Urinary tract infection, site not specified: Secondary | ICD-10-CM | POA: Diagnosis not present

## 2011-09-03 LAB — CBC
HCT: 33.5 % — ABNORMAL LOW (ref 36.0–46.0)
MCH: 29.4 pg (ref 26.0–34.0)
MCV: 87.2 fL (ref 78.0–100.0)
RBC: 3.84 MIL/uL — ABNORMAL LOW (ref 3.87–5.11)
RDW: 15.2 % (ref 11.5–15.5)
WBC: 5.4 10*3/uL (ref 4.0–10.5)

## 2011-09-03 LAB — BASIC METABOLIC PANEL
BUN: 15 mg/dL (ref 6–23)
CO2: 27 mEq/L (ref 19–32)
Calcium: 8.6 mg/dL (ref 8.4–10.5)
Chloride: 105 mEq/L (ref 96–112)
Creatinine, Ser: 0.6 mg/dL (ref 0.50–1.10)

## 2011-09-03 LAB — GLUCOSE, CAPILLARY: Glucose-Capillary: 100 mg/dL — ABNORMAL HIGH (ref 70–99)

## 2011-09-03 MED ORDER — LISINOPRIL 10 MG PO TABS
40.0000 mg | ORAL_TABLET | Freq: Every day | ORAL | Status: DC
  Administered 2011-09-03: 40 mg via ORAL
  Filled 2011-09-03: qty 4

## 2011-09-03 MED ORDER — CIPROFLOXACIN HCL 500 MG PO TABS: 500.0000 mg | ORAL_TABLET | Freq: Two times a day (BID) | ORAL | Status: AC

## 2011-09-03 MED ORDER — ENOXAPARIN SODIUM 30 MG/0.3ML ~~LOC~~ SOLN: 40.0000 mg | SUBCUTANEOUS | Status: DC

## 2011-09-03 MED ORDER — HYDROCODONE-ACETAMINOPHEN 5-325 MG PO TABS: 1.0000 | ORAL_TABLET | ORAL | Status: AC | PRN

## 2011-09-03 MED ORDER — METHOCARBAMOL 500 MG PO TABS: 500.0000 mg | ORAL_TABLET | Freq: Four times a day (QID) | ORAL | Status: AC | PRN

## 2011-09-03 NOTE — Progress Notes (Signed)
Pt d/c today by MD to SNF.  CSW presented bed offers and family choose [facility]. Facility notified and to transport pt.  D/C summary faxed.   Karn Cassis

## 2011-09-03 NOTE — Progress Notes (Signed)
Patient d/c to [facility] Report called to Nurse Patient transported by [facility], discharge packet sent with patient No c/o pain at d/c, left floor via wheelchair accompanied by staff Eesha Schmaltz, Kae Heller

## 2011-09-03 NOTE — Discharge Summary (Signed)
Physician Discharge Summary  Patient ID: Allison Collier MRN: [id number] DOB/AGE: 02/18/1921 78 y.o.  Admit date: 08/29/2011 Discharge date: 09/03/2011  Primary Care Physician:  Esmond Harps, MD   Discharge Diagnoses:    Active Problems:  HTN (hypertension)  Hyperlipidemia  Osteoarthritis  GERD (gastroesophageal reflux disease)  Osteopenia  CAD (coronary artery disease)  Hypothyroid  Closed left hip fracture  Anemia  UTI (lower urinary tract infection)    Current Discharge Medication List    START taking these medications   Details  ciprofloxacin (CIPRO) 500 MG tablet Take 1 tablet (500 mg total) by mouth 2 (two) times daily. Qty: 14 tablet, Refills: 0    enoxaparin (LOVENOX) 30 MG/0.3ML SOLN Inject 0.4 mLs (40 mg total) into the skin daily. Until 10/01/11 Qty: 8.4 mL    HYDROcodone-acetaminophen (NORCO) 5-325 MG per tablet Take 1-2 tablets by mouth every 4 (four) hours as needed. Qty: 30 tablet, Refills: 0    methocarbamol (ROBAXIN) 500 MG tablet Take 1 tablet (500 mg total) by mouth every 6 (six) hours as needed.      CONTINUE these medications which have NOT CHANGED   Details  acetaminophen-codeine (TYLENOL #3) 300-30 MG per tablet Take 1 tablet by mouth every 12 (twelve) hours as needed. with food    alendronate (FOSAMAX) 70 MG tablet Take 70 mg by mouth every 7 (seven) days. Take with a full glass of water on an empty stomach.     aspirin EC 81 MG tablet Take 81 mg by mouth at bedtime.    ezetimibe (ZETIA) 10 MG tablet Take 10 mg by mouth daily.      levothyroxine (SYNTHROID, LEVOTHROID) 100 MCG tablet Take 100 mcg by mouth daily.      lisinopril (PRINIVIL,ZESTRIL) 40 MG tablet Take 40 mg by mouth daily.      metoprolol (TOPROL-XL) 100 MG 24 hr tablet Take 100 mg by mouth daily.      nitroGLYCERIN (NITROSTAT) 0.4 MG SL tablet Place 0.4 mg under the tongue every 5 (five) minutes as needed. For chest pains    ranitidine (ZANTAC) 150 MG tablet Take 150 mg by  mouth daily.     simvastatin (ZOCOR) 80 MG tablet Take 80 mg by mouth at bedtime.         Disposition and Follow-up:  Follow up with Dr. Romeo Apple in 1 month Weight bearing as tolerated Staples to be removed on 09/12/11 with steri strips applied  Consults: Orthopedics, Dr. Romeo Apple   Significant Diagnostic Studies:  Dg Chest 1 View  08/30/2011  *RADIOLOGY REPORT*  Clinical Data: Fall.  Left femur pain.  CHEST - 1 VIEW  Comparison: 02/23/2006.  Findings: Left lung is clear.  Cardiopericardial silhouette and mediastinal contours appear normal.  Aortic arch atherosclerosis. Bilateral pleural apical thickening is present, greater on the right than left.  Small right pleural effusion is present.  No right-sided pneumothorax.  Hazy opacity of the right chest is probably technical in nature.  There is no focal airspace consolidation.  T8 compression fracture is present which is new compared to the prior exam.  Osteopenia.  Per CMS PQRS reporting requirements (PQRS Measure 24): Given the patient's age of greater than 50 and the fracture site (hip, distal radius, or spine), the patient should be tested for osteoporosis using DXA, and the appropriate treatment considered based on the DXA results.  IMPRESSION:  1.  Small right pleural effusion.  Hazy opacity over the right hemithorax is favored to be technical.  No focal consolidation  or pneumothorax.  No displaced rib fractures identified. 2.  Age indeterminant T8 compression fracture.  This is new compared to prior radiographs of 2007.  Original Report Authenticated By: Andreas Newport, M.D.   Dg Pelvis 1-2 Views  08/30/2011  *RADIOLOGY REPORT*  Clinical Data: Fall.  Left femur pain.  PELVIS - 1-2 VIEW  Comparison: None.  Findings: Intertrochanteric left femur fracture.  Joint spaces appear symmetric.  Pelvic rings appear intact.  Partially visualized lumbar spondylosis.  IMPRESSION: Intertrochanteric left femur fracture.  Original Report Authenticated By:  Andreas Newport, M.D.   Dg Femur Left  08/30/2011  *RADIOLOGY REPORT*  Clinical Data: Fall.  Left hip pain.  LEFT FEMUR - 2 VIEW  Comparison: None.  Findings: Intertrochanteric left femur fracture is present.  Medial displacement of the lesser trochanter.  Hip joint space appears preserved.  No significant osteoarthritis.  Atherosclerosis. Distal femur appears intact.  IMPRESSION: Intertrochanteric left femur fracture with medial displacement of the lesser trochanter.  Original Report Authenticated By: Andreas Newport, M.D.   Ct Head Wo Contrast  08/30/2011  *RADIOLOGY REPORT*  Clinical Data: Dizziness.  Headache.  Falls.  Left hip fracture.  CT HEAD WITHOUT CONTRAST  Technique:  Contiguous axial images were obtained from the base of the skull through the vertex without contrast.  Comparison: None.  Findings: No mass lesion, mass effect, midline shift, hydrocephalus, hemorrhage.  No acute territorial cortical ischemia/infarct. Atrophy and chronic ischemic white matter disease is present.  Atrophy is age appropriate.  Intracranial atherosclerosis.  No skull fracture.  IMPRESSION: Mild atrophy and chronic ischemic white matter disease.  No acute intracranial abnormality.  Original Report Authenticated By: Andreas Newport, M.D.    Brief H and P: For complete details please refer to admission H and P, but in brief Allison Collier is an 78 y.o. female. Lives with her daughter; osteoporosis, hypertension, hyperlipidemia coronary artery disease status post stenting about 8 years; history of esophageal cancer, status post surgical reconstruction now with esophageal stricture; believes she tripped over a mat at home and had a fall witnessed by her daughter. Brought to the emergency room and found to have left intertrochanteric fracture.  Patient denies any chest pain shortness of breath or palpitation rounding the fall; but says she's been having episodic headaches since falling and hitting her head some days ago.    Denies fever dysuria nausea or vomiting.  Does not much physical activity, and cigarette short of breath just walking down the driveway to her car; does not climb stairs.  Chronic lower extremity varicose veins.     Hospital Course:  Patient was admitted to the hospital for ORIF of left hip fracture by Dr. Romeo Apple.  She tolerated this well without complications.  She will need to continue to work with physical therapy at a skilled nursing facility and will follow up with Dr. Romeo Apple in 1 month.  VTE prophylaxis will be lovenox for 30 days.  She was also noted to be somewhat lethargic.  Her urinalysis showed 11-20WBC and urine culture grew E coli.  Patient will be started on a course of ciprofloxacin  She was also found to be anemic post operatively.  She was transfused 2 units prbc and her hemoglobin has responded appropriately and is stable.  Her blood pressure is currently elevated, she will be restarted on her lisinopril which was held on admission.  Further titration can be done as an outpatient.  Patient is stable to transfer to snf.  Plan was discussed with family  who are in agreement.  Time spent on Discharge:  Signed: MEMON,JEHANZEB [facility] 09/03/2011, 12:09 PM

## 2011-09-03 NOTE — Progress Notes (Signed)
Subjective: 3 Days Post-Op Procedure(s) (LRB): OPEN REDUCTION INTERNAL FIXATION HIP (Left) Patient reports pain as moderate.    Objective: Vital signs in last 24 hours: Temp:  [97.2 F (36.2 C)-98.3 F (36.8 C)] 98 F (36.7 C) (01/14 0535) Pulse Rate:  [72-86] 77  (01/14 0535) Resp:  [16-18] 18  (01/14 0535) BP: (151-193)/(56-79) 193/75 mmHg (01/14 0535) SpO2:  [96 %-99 %] 96 % (01/14 0535) Weight:  [71.759 kg (158 lb 3.2 oz)-71.804 kg (158 lb 4.8 oz)] 71.759 kg (158 lb 3.2 oz) (01/14 0646)  Intake/Output from previous day: 01/13 0701 - 01/14 0700 In: 685 [P.O.:660; Blood:25] Out: 400 [Urine:400] Intake/Output this shift:     Basename 09/03/11 0456 09/02/11 0401 09/01/11 0630  HGB 11.3* 8.6* 7.9*    Basename 09/03/11 0456 09/02/11 0401  WBC 5.4 5.4  RBC 3.84* 2.94*  HCT 33.5* 25.4*  PLT 97* 92*    Basename 09/03/11 0456 09/02/11 0401  NA 135 136  K 4.0 4.6  CL 105 107  CO2 27 27  BUN 15 20  CREATININE 0.60 0.78  GLUCOSE 104* 100*  CALCIUM 8.6 8.4   No results found for this basename: LABPT:2,INR:2 in the last 72 hours  Intact pulses distally Dorsiflexion/Plantar flexion intact  Assessment/Plan: 3 Days Post-Op Procedure(s) (LRB): OPEN REDUCTION INTERNAL FIXATION HIP (Left) Discharge to SNF HIP FRACTURE INSTRUCTIONS: TAKE STAPLES OUT POD 12 APPLY STERI STRIPS WEIGHT BEARING AS TOLERATED WITH A WALKER X 6 WEEKS  LOVENOX X 30 DAYS  HIP PRECAUTIONS: NO  F/U IN 1 MONTH   Fuller Canada 09/03/2011, 8:31 AM

## 2011-09-04 ENCOUNTER — Encounter (HOSPITAL_COMMUNITY): Payer: Self-pay | Admitting: Orthopedic Surgery

## 2011-09-12 ENCOUNTER — Encounter: Payer: Self-pay | Admitting: Orthopedic Surgery

## 2011-09-12 ENCOUNTER — Ambulatory Visit (INDEPENDENT_AMBULATORY_CARE_PROVIDER_SITE_OTHER): Payer: Medicare Other | Admitting: Orthopedic Surgery

## 2011-09-12 VITALS — BP 140/70 | Ht 67.0 in | Wt 158.0 lb

## 2011-09-12 DIAGNOSIS — S72002A Fracture of unspecified part of neck of left femur, initial encounter for closed fracture: Secondary | ICD-10-CM

## 2011-09-12 DIAGNOSIS — S72009A Fracture of unspecified part of neck of unspecified femur, initial encounter for closed fracture: Secondary | ICD-10-CM

## 2011-09-12 NOTE — Patient Instructions (Signed)
Stop lovenox   Staples out fri

## 2011-09-12 NOTE — Progress Notes (Signed)
Patient ID: Allison Collier, female   DOB: 1920/12/23, 78 y.o.   MRN: [id number]   Status post open treatment internal fixation LEFT hip fracture with gamma nail.  Question of wound drainage yesterday.  Patient came in to have that Checked out The LEFT thigh is swollen.  There is no drainage from the incisions.  Some of the staples were removed today.  The remainder can be removed in a couple of days.  There may be a sub-cutaneous or deep hematoma which should resolve if the Lovenox is stopped.

## 2011-10-04 ENCOUNTER — Ambulatory Visit (INDEPENDENT_AMBULATORY_CARE_PROVIDER_SITE_OTHER): Payer: Medicare Other | Admitting: Orthopedic Surgery

## 2011-10-04 ENCOUNTER — Encounter: Payer: Self-pay | Admitting: Orthopedic Surgery

## 2011-10-04 VITALS — BP 122/60 | Ht 67.0 in | Wt 158.0 lb

## 2011-10-04 DIAGNOSIS — S72002A Fracture of unspecified part of neck of left femur, initial encounter for closed fracture: Secondary | ICD-10-CM

## 2011-10-04 DIAGNOSIS — S72009A Fracture of unspecified part of neck of unspecified femur, initial encounter for closed fracture: Secondary | ICD-10-CM

## 2011-10-04 NOTE — Progress Notes (Signed)
Patient ID: Allison Collier, female   DOB: 12/17/1920, 78 y.o.   MRN: [id number]  This is a postop visit.  The date of surgery was August 31, 2011  Procedure short gamma nail fixation, LEFT peritrochanteric fracture.  Patient currently residing at [facility].  BP 122/60  Ht 5\' 7"  (1.702 m)  Wt 71.668 kg (158 lb)  BMI 24.75 kg/m2   Her incision is clean, dry, and intact.  We will order an x-ray to be done and sent to the office initial followup in 2 months for repeat AP and lateral of the short gamma nail

## 2011-10-04 NOTE — Patient Instructions (Signed)
Weight bearing as tolerated  Xray with in 1 week send to doctor actual films

## 2011-10-08 ENCOUNTER — Other Ambulatory Visit: Payer: Self-pay | Admitting: Orthopedic Surgery

## 2011-10-08 ENCOUNTER — Ambulatory Visit (HOSPITAL_COMMUNITY)
Admission: RE | Admit: 2011-10-08 | Discharge: 2011-10-08 | Disposition: A | Discharge status: Home / Self Care | Payer: Medicare Other | Source: Ambulatory Visit | Attending: Orthopedic Surgery | Admitting: Orthopedic Surgery

## 2011-10-08 DIAGNOSIS — Z4789 Encounter for other orthopedic aftercare: Secondary | ICD-10-CM | POA: Insufficient documentation

## 2011-10-08 DIAGNOSIS — S72002A Fracture of unspecified part of neck of left femur, initial encounter for closed fracture: Secondary | ICD-10-CM

## 2011-11-21 ENCOUNTER — Emergency Department (HOSPITAL_COMMUNITY): Payer: Medicare Other

## 2011-11-21 ENCOUNTER — Emergency Department (HOSPITAL_COMMUNITY)
Admission: EM | Admit: 2011-11-21 | Discharge: 2011-11-22 | Disposition: A | Discharge status: Home / Self Care | Payer: Medicare Other | Attending: Emergency Medicine | Admitting: Emergency Medicine

## 2011-11-21 ENCOUNTER — Encounter (HOSPITAL_COMMUNITY): Payer: Self-pay | Admitting: *Deleted

## 2011-11-21 DIAGNOSIS — E785 Hyperlipidemia, unspecified: Secondary | ICD-10-CM | POA: Insufficient documentation

## 2011-11-21 DIAGNOSIS — M545 Low back pain, unspecified: Secondary | ICD-10-CM | POA: Insufficient documentation

## 2011-11-21 DIAGNOSIS — M533 Sacrococcygeal disorders, not elsewhere classified: Secondary | ICD-10-CM | POA: Insufficient documentation

## 2011-11-21 DIAGNOSIS — I10 Essential (primary) hypertension: Secondary | ICD-10-CM | POA: Insufficient documentation

## 2011-11-21 DIAGNOSIS — L22 Diaper dermatitis: Secondary | ICD-10-CM | POA: Insufficient documentation

## 2011-11-21 DIAGNOSIS — Z79899 Other long term (current) drug therapy: Secondary | ICD-10-CM | POA: Insufficient documentation

## 2011-11-21 DIAGNOSIS — M25559 Pain in unspecified hip: Secondary | ICD-10-CM | POA: Insufficient documentation

## 2011-11-21 HISTORY — DX: Malignant (primary) neoplasm, unspecified: C80.1

## 2011-11-21 NOTE — ED Notes (Signed)
Sore area on hip?

## 2011-11-21 NOTE — ED Provider Notes (Signed)
History    This chart was scribed for Felisa Bonier, MD, MD by Smitty Pluck. The patient was seen in room APA18 and the patient's care was started at 9:27PM.   CSN: [id number]  Arrival date & time 11/21/11  2040   First MD Initiated Contact with Patient 11/21/11 2107      Chief Complaint  Patient presents with  . Hip Pain    (Consider location/radiation/quality/duration/timing/severity/associated sxs/prior treatment) The history is provided by the patient and a relative.   Allison Collier is a 78 y.o. female who presents to the Emergency Department complaining of moderate buttock pain. Pt was given Miralax for constipation at rest home. The symptoms have been constant since onset without radiation. Pt is not in any other pain.  Past Medical History  Diagnosis Date  . GERD (gastroesophageal reflux disease)   . Thyroid disease   . Hypertension   . Hyperlipidemia   . Osteoporosis   . Cancer     Past Surgical History  Procedure Date  . Esophagogatroduodenoscopy    . Orif hip fracture 08/31/2011    Procedure: OPEN REDUCTION INTERNAL FIXATION HIP;  Surgeon: Fuller Canada, MD;  Location: [facility];  Service: Orthopedics;  Laterality: Left;  ORIF/Gamma Nail Left Hip  . Eye surgery   . Coronary angioplasty with stent placement     History reviewed. No pertinent family history.  History  Substance Use Topics  . Smoking status: Never Smoker   . Smokeless tobacco: Current User    Types: Snuff  . Alcohol Use: No    OB History    Grav Para Term Preterm Abortions TAB SAB Ect Mult Living                  Review of Systems  All other systems reviewed and are negative.   10 Systems reviewed and all are negative for acute change except as noted in the HPI.   Allergies  Sulfa antibiotics  Home Medications   Current Outpatient Rx  Name Route Sig Dispense Refill  . ACETAMINOPHEN-CODEINE #3 300-30 MG PO TABS Oral Take 1 tablet by mouth every 12 (twelve) hours as  needed. with food    . ALENDRONATE SODIUM 70 MG PO TABS Oral Take 70 mg by mouth every 7 (seven) days. Take with a full glass of water on an empty stomach.     . ASPIRIN EC 81 MG PO TBEC Oral Take 81 mg by mouth at bedtime.    Marland Kitchen ENOXAPARIN SODIUM 30 MG/0.3ML Lipscomb SOLN Subcutaneous Inject 0.4 mLs (40 mg total) into the skin daily. Until 10/01/11 8.4 mL   . EZETIMIBE 10 MG PO TABS Oral Take 10 mg by mouth daily.      Marland Kitchen LEVOTHYROXINE SODIUM 100 MCG PO TABS Oral Take 100 mcg by mouth daily.      Marland Kitchen LISINOPRIL 40 MG PO TABS Oral Take 40 mg by mouth daily.      Marland Kitchen METOPROLOL SUCCINATE ER 100 MG PO TB24 Oral Take 100 mg by mouth daily.      Marland Kitchen NITROGLYCERIN 0.4 MG SL SUBL Sublingual Place 0.4 mg under the tongue every 5 (five) minutes as needed. For chest pains    . RANITIDINE HCL 150 MG PO TABS Oral Take 150 mg by mouth daily.     Marland Kitchen SIMVASTATIN 80 MG PO TABS Oral Take 80 mg by mouth at bedtime.      BP 134/78  Pulse 98  Temp(Src) 97.7 F (36.5 C) (Oral)  Resp 18  Wt 158 lb (71.668 kg)  SpO2 98%  Physical Exam  Nursing note and vitals reviewed. Constitutional: She is oriented to person, place, and time. She appears well-developed and well-nourished. She is cooperative. She does not have a sickly appearance. No distress.  HENT:  Head: Normocephalic and atraumatic. Head is without raccoon's eyes, without Battle's sign, without abrasion and without contusion.  Right Ear: Hearing, tympanic membrane, external ear and ear canal normal.  Left Ear: Hearing, tympanic membrane, external ear and ear canal normal.  Nose: Nose normal. No mucosal edema, rhinorrhea, nose lacerations, sinus tenderness, nasal deformity, septal deviation or nasal septal hematoma. No epistaxis. Right sinus exhibits no maxillary sinus tenderness and no frontal sinus tenderness. Left sinus exhibits no maxillary sinus tenderness and no frontal sinus tenderness.  Mouth/Throat: Uvula is midline, oropharynx is clear and moist and mucous  membranes are normal. Normal dentition.  Eyes: Conjunctivae and EOM are normal. Pupils are equal, round, and reactive to light.  Neck: Trachea normal, normal range of motion, full passive range of motion without pain and phonation normal. Neck supple. No JVD present. No spinous process tenderness and no muscular tenderness present. No tracheal deviation and normal range of motion present.  Cardiovascular: Normal rate, regular rhythm, intact distal pulses and normal pulses.   No extrasystoles are present.  Pulses:      Radial pulses are 2+ on the right side, and 2+ on the left side.       Dorsalis pedis pulses are 2+ on the right side, and 2+ on the left side.       Posterior tibial pulses are 2+ on the right side, and 2+ on the left side.  Pulmonary/Chest: Effort normal and breath sounds normal. No accessory muscle usage or stridor. Not tachypneic. No respiratory distress. She has no decreased breath sounds. She has no wheezes. She has no rhonchi. She has no rales. She exhibits no tenderness, no bony tenderness, no crepitus, no deformity and no retraction.  Abdominal: Soft. Normal appearance and bowel sounds are normal. She exhibits no distension and no pulsatile midline mass. There is no hepatosplenomegaly. There is no tenderness. There is no rebound, no guarding and no CVA tenderness.  Musculoskeletal: Normal range of motion. She exhibits no edema and no tenderness.       Cervical back: Normal. She exhibits no tenderness, no bony tenderness, no deformity, no pain and no spasm.       Thoracic back: She exhibits no tenderness, no bony tenderness, no deformity, no pain and no spasm.       Lumbar back: She exhibits no tenderness, no bony tenderness, no deformity, no pain and no spasm.       Mild tenderness without deformity over sacral with mild redness and irritation  Moving lower extremities bilaterally    Neurological: She is alert and oriented to person, place, and time. She has normal strength.  No cranial nerve deficit or sensory deficit. She exhibits normal muscle tone. GCS eye subscore is 4. GCS verbal subscore is 5. GCS motor subscore is 6.  Reflex Scores:      Brachioradialis reflexes are 2+ on the right side and 2+ on the left side.      Patellar reflexes are 2+ on the right side and 2+ on the left side. Skin: Skin is warm, dry and intact. Rash noted. No bruising, no ecchymosis, no laceration and no lesion noted. She is not diaphoretic. There is erythema. No pallor.  Mild erythematous patch of skin with slight induration, dry, without scaling, blisters, bullae, petechia, papules, at the superior gluteal cleft and over the sacrum, compatible with potentially stage I pressure sore or diaper dermatitis. No fluctuance underlying, no breakdown in the integrity of the skin. There is no drainage. No deformity to the bony structure underneath the skin. The patient is moving all extremities without difficulty.  Psychiatric: She has a normal mood and affect. Her speech is normal and behavior is normal. Judgment and thought content normal. Cognition and memory are normal.    ED Course  Procedures (including critical care time) DIAGNOSTIC STUDIES: Oxygen Saturation is 98% on room air, normal by my interpretation.    COORDINATION OF CARE: 9:32PM EDP discusses pt ED treatment course with pt    Labs Reviewed - No data to display Dg Lumbar Spine Complete  11/21/2011  *RADIOLOGY REPORT*  Clinical Data: Back pain.  LUMBAR SPINE - COMPLETE 4+ VIEW  Comparison: None  Findings: There is a mild right convex lumbar scoliosis with moderate degenerative lumbar spondylosis.  Disc disease and facet disease is noted without acute fracture.  Extensive vascular calcifications are noted.  The visualized bony pelvis is intact. The rounded calcification in the right upper quadrant is likely a gallstone.  IMPRESSION: Scoliosis and degenerative lumbar spondylosis but no acute findings.  Original Report  Authenticated By: P. Loralie Champagne, M.D.   Dg Sacrum/coccyx  11/21/2011  *RADIOLOGY REPORT*  Clinical Data: Larey Seat.  Sacral pain.  SACRUM AND COCCYX - 2+ VIEW  Comparison: None  Findings: The pubic symphysis and SI joints are intact.  No definite acute sacral fracture.  IMPRESSION: No acute bony findings.  Original Report Authenticated By: P. Loralie Champagne, M.D.     No diagnosis found.    MDM  The patient had reported a fall in the remote past and pain over her sacrum, as such, I obtained radiographs to assure no bony injury, and there's not any apparent injury.  The patient has erythematous skin findings over the superior gluteal cleft and sacrum that are either stage I pressure sore, or diaper dermatitis. I discussed with the family.assuring that the patient gets up and walks, and rolls or changes position frequently if she is sitting in a chair or in bed for a prolonged period they state that the patient has been up and walking, and he do not think that a pressure sore is likely. I will treat the patient for a diaper dermatitis.   I personally performed the services described in this documentation, which was scribed in my presence. The recorded information has been reviewed and considered.        Felisa Bonier, MD 11/22/11 [phone]

## 2011-11-22 MED ORDER — NYSTATIN-TRIAMCINOLONE 100000-0.1 UNIT/GM-% EX OINT: TOPICAL_OINTMENT | Freq: Two times a day (BID) | CUTANEOUS | Status: AC

## 2011-11-22 NOTE — Discharge Instructions (Signed)
Diaper Rash Your caregiver has diagnosed your baby as having diaper rash. CAUSES  Diaper rash can have a number of causes. The baby's bottom is often wet, so the skin there becomes soft and damaged. It is more susceptible to inflammation (irritation) and infections. This process is caused by the constant contact with:  Urine.   Fecal material.   Retained diaper soap.   Yeast.   Germs (bacteria).  TREATMENT   If the rash has been diagnosed as a recurrent yeast infection (monilia), an antifungal agent such as Monistat cream will be useful.   If the caregiver decides the rash is caused by a yeast or bacterial (germ) infection, he may prescribe an appropriate ointment or cream. If this is the case today:   Use the cream or ointment 3 times per day, unless otherwise directed.   Change the diaper whenever the baby is wet or soiled.   Leaving the diaper off for brief periods of time will also help.  HOME CARE INSTRUCTIONS  Most diaper rash responds readily to simple measures.   Just changing the diapers frequently will allow the skin to become healthier.   Using more absorbent diapers will keep the baby's bottom dryer.   Each diaper change should be accompanied by washing the baby's bottom with warm soapy water. Dry it thoroughly. Make sure no soap remains on the skin.   Over the counter ointments such as A&D, petrolatum and zinc oxide paste may also prove useful. Ointments, if available, are generally less irritating than creams. Creams may produce a burning feeling when applied to irritated skin.  SEEK MEDICAL CARE IF:  The rash has not improved in 2 to 3 days, or if the rash gets worse. You should make an appointment to see your baby's caregiver. SEEK IMMEDIATE MEDICAL CARE IF:  A fever develops over 100.4 F (38.0 C) or as your caregiver suggests. MAKE SURE YOU:   Understand these instructions.   Will watch your condition.   Will get help right away if you are not doing  well or get worse.  Document Released: 08/03/2000 Document Revised: 07/26/2011 Document Reviewed: 03/11/2008 [facility] Patient Information 2012 [place], [place].Diaper Yeast Infection A yeast infection is a common cause of diaper rash. CAUSES  Yeast infections are caused by a germ that is normally found on the skin and in the mouth and intestine.  The yeast germs stay in balance with other germs normally found on the skin. A rash can occur if the yeast germ population gets out of balance. This can happen if:  A common diaper rash causes injury to the skin.   The baby or nursing mother is on antibiotic medicines. This upsets the balance on the skin, allowing the yeast to overgrow.  The infection can happen in more than one place. Yeast infection of the mouth (thrush) can happen at the same time as the infection in the diaper area. SYMPTOMS  The skin may show:  Redness.   Small red patches or bumps around a larger area of red skin.   Tenderness to cleaning.   Itching.   Scaling.  DIAGNOSIS  The infection is usually diagnosed based on how the rash looks. Sometimes, the child's caregiver may take a sample of skin to confirm the diagnosis.  TREATMENT   This rash is treated with a cream or ointment that kills yeast germs. Some are available as over-the-counter medicine. Some are available by prescription only. Commonly used medicines include:   Clotrimazole.   Nystatin.  Miconazole.   If there is thrush, medicine by mouth may also be prescribed. Do not use skin cream or lotions in the mouth.  HOME CARE INSTRUCTIONS  Keep the diaper area clean and dry.   Change the diapers as soon as possible after urine or bowel movements.   Use warm water on a soft cloth to clean urine. Use a mild soap and water to clean bowel movements.   Use a soft towel to pat dry the diaper area. Do not rub.   Avoid baby wipes, especially those with scent or alcohol.   Wash your hands after changing  diapers.   Keep the front of the diapers off whenever possible to allow drying of the skin.   Do not use soap and other harsh chemicals extensively around the diaper area.   Do not use scented baby wipes or those that contain alcohol.   After cleansing, apply prescribed creams or ointments sparingly. Then, apply healing ointment or vitaman A and D ointment liberally. This will protect the rash area from further irritation from urine or bowel movements.  SEEK MEDICAL CARE IF:   The rash does not get better after a few days of treatment.   The rash is spreading, despite treatment.   A rash is present on the skin away from the diaper area.   White patches appear in the mouth.   Oozing or crusting of the skin occurs.  Document Released: 11/02/2008 Document Revised: 07/26/2011 Document Reviewed: 11/02/2008 [facility] Patient Information 2012 [place], [place].Cutaneous Candidiasis Cutaneous candidiasis is a condition in which there is an overgrowth of yeast (candida) on the skin. Yeast normally live on the skin, but in small enough numbers not to cause any symptoms. In certain cases, increased growth of the yeast may cause an actual yeast infection. This kind of infection usually occurs in areas of the skin that are constantly warm and moist, such as the armpits or the groin. Yeast is the most common cause of diaper rash in babies and in people who cannot control their bowel movements (incontinence). CAUSES  The fungus that most often causes cutaneous candidiasis is Candida albicans. Conditions that can increase the risk of getting a yeast infection of the skin include:  Obesity.   Pregnancy.   Diabetes.   Taking antibiotic medicine.   Taking birth control pills.   Taking steroid medicines.   Thyroid disease.   An iron or zinc deficiency.   Problems with the immune system.  SYMPTOMS   Red, swollen area of the skin.   Bumps on the skin.   Itchiness.  DIAGNOSIS  The diagnosis  of cutaneous candidiasis is usually based on its appearance. Light scrapings of the skin may also be taken and viewed under a microscope to identify the presence of yeast. TREATMENT  Antifungal creams may be applied to the infected skin. In severe cases, oral medicines may be needed.  HOME CARE INSTRUCTIONS   Keep your skin clean and dry.   Maintain a healthy weight.   If you have diabetes, keep your blood sugar under control.  SEEK IMMEDIATE MEDICAL CARE IF:  Your rash continues to spread despite treatment.   You have a fever, chills, or abdominal pain.  Document Released: 04/24/2011 Document Revised: 07/26/2011 Document Reviewed: 04/24/2011 [facility] Patient Information 2012 [place], [place].

## 2011-12-04 ENCOUNTER — Ambulatory Visit (HOSPITAL_COMMUNITY)
Admission: RE | Admit: 2011-12-04 | Discharge: 2011-12-04 | Disposition: A | Discharge status: Home / Self Care | Payer: Medicare Other | Source: Ambulatory Visit | Attending: Orthopedic Surgery | Admitting: Orthopedic Surgery

## 2011-12-04 ENCOUNTER — Other Ambulatory Visit: Payer: Self-pay | Admitting: Orthopedic Surgery

## 2011-12-04 DIAGNOSIS — X58XXXA Exposure to other specified factors, initial encounter: Secondary | ICD-10-CM | POA: Insufficient documentation

## 2011-12-04 DIAGNOSIS — S72002A Fracture of unspecified part of neck of left femur, initial encounter for closed fracture: Secondary | ICD-10-CM

## 2011-12-04 DIAGNOSIS — S72009A Fracture of unspecified part of neck of unspecified femur, initial encounter for closed fracture: Secondary | ICD-10-CM | POA: Insufficient documentation

## 2011-12-04 DIAGNOSIS — Z9889 Other specified postprocedural states: Secondary | ICD-10-CM | POA: Insufficient documentation

## 2011-12-05 ENCOUNTER — Encounter: Payer: Self-pay | Admitting: Orthopedic Surgery

## 2011-12-05 ENCOUNTER — Ambulatory Visit (INDEPENDENT_AMBULATORY_CARE_PROVIDER_SITE_OTHER): Payer: Medicare Other | Admitting: Orthopedic Surgery

## 2011-12-05 VITALS — BP 120/70 | Ht 67.0 in | Wt 158.0 lb

## 2011-12-05 DIAGNOSIS — S72002A Fracture of unspecified part of neck of left femur, initial encounter for closed fracture: Secondary | ICD-10-CM

## 2011-12-05 DIAGNOSIS — S72009A Fracture of unspecified part of neck of unspecified femur, initial encounter for closed fracture: Secondary | ICD-10-CM

## 2011-12-05 NOTE — Progress Notes (Signed)
Patient ID: Allison Collier, female   DOB: 12/06/1920, 78 y.o.   MRN: [id number] Chief Complaint  Patient presents with  . Follow-up    2 month recheck left hip, DOS 08/31/11   BP 120/70  Ht 5\' 7"  (1.702 m)  Wt 158 lb (71.668 kg)  BMI 24.75 kg/m2  Status post gamma nail LEFT hip greater trochanteric fracture.  She is ambulatory with a walker.  His return to home.  She has full range of motion of the hip with no evidence of pain or limitation of motion.  X-ray shows partial union of the hip fracture with heterotopic bone and callus forming around the fracture.  Recommend x-ray in 3 months

## 2011-12-05 NOTE — Patient Instructions (Signed)
Call in for refill

## 2011-12-16 ENCOUNTER — Emergency Department (HOSPITAL_COMMUNITY)
Admission: EM | Admit: 2011-12-16 | Discharge: 2011-12-16 | Disposition: A | Discharge status: Home / Self Care | Payer: Medicare Other | Attending: Emergency Medicine | Admitting: Emergency Medicine

## 2011-12-16 ENCOUNTER — Emergency Department (HOSPITAL_COMMUNITY): Payer: Medicare Other

## 2011-12-16 ENCOUNTER — Encounter (HOSPITAL_COMMUNITY): Payer: Self-pay | Admitting: *Deleted

## 2011-12-16 DIAGNOSIS — I10 Essential (primary) hypertension: Secondary | ICD-10-CM | POA: Insufficient documentation

## 2011-12-16 DIAGNOSIS — E785 Hyperlipidemia, unspecified: Secondary | ICD-10-CM | POA: Insufficient documentation

## 2011-12-16 DIAGNOSIS — Z8501 Personal history of malignant neoplasm of esophagus: Secondary | ICD-10-CM | POA: Insufficient documentation

## 2011-12-16 DIAGNOSIS — R404 Transient alteration of awareness: Secondary | ICD-10-CM | POA: Insufficient documentation

## 2011-12-16 DIAGNOSIS — F29 Unspecified psychosis not due to a substance or known physiological condition: Secondary | ICD-10-CM | POA: Insufficient documentation

## 2011-12-16 DIAGNOSIS — K219 Gastro-esophageal reflux disease without esophagitis: Secondary | ICD-10-CM | POA: Insufficient documentation

## 2011-12-16 DIAGNOSIS — R55 Syncope and collapse: Secondary | ICD-10-CM | POA: Insufficient documentation

## 2011-12-16 DIAGNOSIS — M810 Age-related osteoporosis without current pathological fracture: Secondary | ICD-10-CM | POA: Insufficient documentation

## 2011-12-16 DIAGNOSIS — R630 Anorexia: Secondary | ICD-10-CM | POA: Insufficient documentation

## 2011-12-16 DIAGNOSIS — E079 Disorder of thyroid, unspecified: Secondary | ICD-10-CM | POA: Insufficient documentation

## 2011-12-16 DIAGNOSIS — R4182 Altered mental status, unspecified: Secondary | ICD-10-CM | POA: Insufficient documentation

## 2011-12-16 DIAGNOSIS — Z79899 Other long term (current) drug therapy: Secondary | ICD-10-CM | POA: Insufficient documentation

## 2011-12-16 LAB — DIFFERENTIAL
Basophils Absolute: 0 10*3/uL (ref 0.0–0.1)
Eosinophils Relative: 0 % (ref 0–5)
Lymphocytes Relative: 12 % (ref 12–46)
Lymphs Abs: 1 10*3/uL (ref 0.7–4.0)
Neutro Abs: 6.6 10*3/uL (ref 1.7–7.7)
Neutrophils Relative %: 81 % — ABNORMAL HIGH (ref 43–77)

## 2011-12-16 LAB — URINALYSIS, ROUTINE W REFLEX MICROSCOPIC
Glucose, UA: NEGATIVE mg/dL
Protein, ur: 30 mg/dL — AB
Specific Gravity, Urine: 1.015 (ref 1.005–1.030)
pH: 6.5 (ref 5.0–8.0)

## 2011-12-16 LAB — CBC
MCV: 88.6 fL (ref 78.0–100.0)
Platelets: 157 10*3/uL (ref 150–400)
RBC: 4.83 MIL/uL (ref 3.87–5.11)
RDW: 16.8 % — ABNORMAL HIGH (ref 11.5–15.5)
WBC: 8.1 10*3/uL (ref 4.0–10.5)

## 2011-12-16 LAB — BASIC METABOLIC PANEL
CO2: 29 mEq/L (ref 19–32)
Chloride: 95 mEq/L — ABNORMAL LOW (ref 96–112)
Glucose, Bld: 107 mg/dL — ABNORMAL HIGH (ref 70–99)
Potassium: 3.7 mEq/L (ref 3.5–5.1)
Sodium: 134 mEq/L — ABNORMAL LOW (ref 135–145)

## 2011-12-16 LAB — URINE MICROSCOPIC-ADD ON

## 2011-12-16 MED ORDER — SODIUM CHLORIDE 0.9 % IV BOLUS (SEPSIS)
250.0000 mL | Freq: Once | INTRAVENOUS | Status: AC
  Administered 2011-12-16: 17:00:00 via INTRAVENOUS

## 2011-12-16 MED ORDER — SODIUM CHLORIDE 0.9 % IV SOLN: INTRAVENOUS | Status: DC

## 2011-12-16 NOTE — ED Provider Notes (Signed)
History     CSN: [id number]  Arrival date & time 12/16/11  1606   First MD Initiated Contact with Patient 12/16/11 1626      Chief Complaint  Patient presents with  . Loss of Consciousness    (Consider location/radiation/quality/duration/timing/severity/associated sxs/prior treatment) HPI Comments: Allison Collier is a 78 y.o. Female who was at home with family members getting ready to take a shower, sitting in a shower chair, when she "passed out." She was quickly taken to a bed, where she was laid down and woke up. Duration of decreased responsiveness, was less than 30 seconds. She has recovered her baseline confusional state. She had decreased appetite today, did not yet. The last few days there has been no problem noted. She has had similar episodes in the past when she had a urinary tract infection. She had esophageal cancer treatment, remotely. She has not had a recent medical interventions.   Level V Caveat- dementia  Patient is a 79 y.o. female presenting with syncope. The history is provided by the patient and a relative.  Loss of Consciousness    Past Medical History  Diagnosis Date  . GERD (gastroesophageal reflux disease)   . Thyroid disease   . Hypertension   . Hyperlipidemia   . Osteoporosis   . Cancer     Past Surgical History  Procedure Date  . Esophagogatroduodenoscopy    . Orif hip fracture 08/31/2011    Procedure: OPEN REDUCTION INTERNAL FIXATION HIP;  Surgeon: Fuller Canada, MD;  Location: [facility];  Service: Orthopedics;  Laterality: Left;  ORIF/Gamma Nail Left Hip  . Eye surgery   . Coronary angioplasty with stent placement     No family history on file.  History  Substance Use Topics  . Smoking status: Never Smoker   . Smokeless tobacco: Current User    Types: Snuff  . Alcohol Use: No    OB History    Grav Para Term Preterm Abortions TAB SAB Ect Mult Living                  Review of Systems  Unable to perform ROS Cardiovascular:  Positive for syncope.    Allergies  Sulfa antibiotics  Home Medications   Current Outpatient Rx  Name Route Sig Dispense Refill  . ACETAMINOPHEN-CODEINE #3 300-30 MG PO TABS Oral Take 1 tablet by mouth every 12 (twelve) hours as needed. with food    . ALENDRONATE SODIUM 70 MG PO TABS Oral Take 70 mg by mouth every 7 (seven) days. Take with a full glass of water on an empty stomach.     . ASPIRIN EC 81 MG PO TBEC Oral Take 81 mg by mouth at bedtime.    Marland Kitchen ENOXAPARIN SODIUM 30 MG/0.3ML [place] SOLN Subcutaneous Inject 0.4 mLs (40 mg total) into the skin daily. Until 10/01/11 8.4 mL   . EZETIMIBE 10 MG PO TABS Oral Take 10 mg by mouth daily.      Marland Kitchen LEVOTHYROXINE SODIUM 100 MCG PO TABS Oral Take 100 mcg by mouth daily.      Marland Kitchen LISINOPRIL 40 MG PO TABS Oral Take 40 mg by mouth daily.      Marland Kitchen METOPROLOL SUCCINATE ER 100 MG PO TB24 Oral Take 100 mg by mouth daily.      Marland Kitchen NITROGLYCERIN 0.4 MG SL SUBL Sublingual Place 0.4 mg under the tongue every 5 (five) minutes as needed. For chest pains    . NYSTATIN-TRIAMCINOLONE 100000-0.1 UNIT/GM-% EX OINT Topical Apply  topically 2 (two) times daily. Apply topically to the area just above the buttocks after cleansing gently with soap and water and patting dry with towel, twice daily for diaper rash 30 g 0  . RANITIDINE HCL 150 MG PO TABS Oral Take 150 mg by mouth daily.     Marland Kitchen SIMVASTATIN 80 MG PO TABS Oral Take 80 mg by mouth at bedtime.      BP 122/76  Pulse 89  Temp(Src) 99.2 F (37.3 C) (Rectal)  Resp 18  Ht 5\' 7"  (1.702 m)  Wt 120 lb (54.432 kg)  BMI 18.79 kg/m2  SpO2 94%  Physical Exam  Nursing note and vitals reviewed. Constitutional: She appears well-developed.       She is frail  HENT:  Head: Normocephalic and atraumatic.  Eyes: Conjunctivae and EOM are normal. Pupils are equal, round, and reactive to light.  Neck: Normal range of motion and phonation normal. Neck supple.  Cardiovascular: Normal rate, regular rhythm and intact distal pulses.     Pulmonary/Chest: Effort normal and breath sounds normal. She exhibits no tenderness.  Abdominal: Soft. She exhibits no distension and no mass. There is no tenderness. There is no guarding.       No pain to deep palpation. No visible surgical scars.   Musculoskeletal: Normal range of motion. She exhibits no edema and no tenderness.  Neurological: She is alert. She has normal strength. No cranial nerve deficit. She exhibits normal muscle tone. Coordination normal.  Skin: Skin is warm and dry.  Psychiatric: Her behavior is normal.    ED Course  Procedures (including critical care time)  17:25- Benign abdominal exam confirmed. Additional plain film imaging ordered to evaluate for pneumoperitoneum.      Labs Reviewed  CBC - Abnormal; Notable for the following:    RDW 16.8 (*)    All other components within normal limits  DIFFERENTIAL - Abnormal; Notable for the following:    Neutrophils Relative 81 (*)    All other components within normal limits  BASIC METABOLIC PANEL - Abnormal; Notable for the following:    Sodium 134 (*)    Chloride 95 (*)    Glucose, Bld 107 (*)    GFR calc non Af Amer 55 (*)    GFR calc Af Amer 64 (*)    All other components within normal limits  URINALYSIS, ROUTINE W REFLEX MICROSCOPIC - Abnormal; Notable for the following:    APPearance CLOUDY (*)    Hgb urine dipstick MODERATE (*)    Protein, ur 30 (*)    Leukocytes, UA LARGE (*)    All other components within normal limits  URINE MICROSCOPIC-ADD ON - Abnormal; Notable for the following:    Squamous Epithelial / LPF FEW (*)    Bacteria, UA MANY (*)    All other components within normal limits  URINE CULTURE   Dg Chest 1 View  12/16/2011  *RADIOLOGY REPORT*  Clinical Data: Confusion.  Syncope.  History of esophageal cancer.  CHEST - 1 VIEW  Comparison: Chest radiographs 08/30/2011 and 02/23/2006.  Findings: 1659 hours.  The heart size and mediastinal contours are stable.  There is new lucency  projecting over the right hemidiaphragm suspicious for pneumoperitoneum.  There may be some associated layering fluid versus adjacent pleural fluid. There is patchy opacity at the right lung base.  The left lung is clear. There is no evidence of pneumothorax.  IMPRESSION: Concern of pneumoperitoneum with associated pleural parenchymal opacity at the right lung base.  If the patient has not had recent abdominal surgery, a perforated abdominal viscus should be excluded.  Abdominal pelvic CT with contrast suggested for further assessment.  In discussion with Dr. Effie Shy, the patient has no acute abdominal complaints.  Therefore, these findings could be incidental, potentially related to colonic interposition, although not demonstrated previously.  Supine and left lateral decubitus views of the abdomen are suggested for further assessment.  Original Report Authenticated By: Gerrianne Scale, M.D.   Dg Abd 1 View  12/16/2011  *RADIOLOGY REPORT*  Clinical Data: Possible pneumoperitoneum on a chest radiograph obtained earlier today.  ABDOMEN - 1 VIEW  Comparison: Previous examinations, including the chest radiograph obtained earlier today.  Findings: A left lateral decubitus view of the abdomen demonstrates a normal bowel gas pattern without free peritoneal air.  There is an oval collection of air overlying the right lung base and upper liver with mild blunting of the adjacent lateral costophrenic angle.  An air-fluid level was demonstrated within this area on the previous chest radiograph.  Lumbar spine degenerative changes and mild scoliosis.  Diffuse osteopenia.  IMPRESSION:  1.  No free peritoneal air. 2.  Loculated, oval collection of air overlying the right lung base and upper liver.  This could represent a loculated hydropneumothorax.  An intraperitoneal loculation of air and fluid is less likely.  Fluid within a basilar bulla is also a possibility.  Original Report Authenticated By: Darrol Angel, M.D.      1. Syncope       MDM  Reported syncope, without clear cause. Doubt CVA, serious bacterial infection or metabolic instability or acute intra-abdominal process. An incidental abnormality is seen on the chest x-ray. Most likely this represents a bulla in the right lower lung with fluid in it. There is no evidence for pneumonia, pneumothorax, acute intra-abdominal abnormality associated with this fluid collection. Patient stable for discharge with outpatient management.  Plan: Home Medications- usual; Home Treatments- regular home assisstance; Recommended follow up- PCP in 3 days       Flint Melter, MD 12/16/11 2013

## 2011-12-16 NOTE — ED Notes (Signed)
Family states confusion began today. States patient was sitting in a chair in the rest room and passed completely out per son. Hx of UTI's. Weakness also present per family.

## 2011-12-16 NOTE — Discharge Instructions (Signed)
Get plenty of rest, drink a lot of fluids, and see your Dr. for a checkup in 3 days.  The x-rays today suggested that there is a small fluid collection, in an air bubble, in the right lower lung. This should not cause any problems. If she develops, cough, shortness of breath, or severe chest pain; ask her doctor to see her.  Syncope Syncope (fainting) is a sudden, short loss of consciousness. People normally fall to the ground when they faint. Recovery is often fast. HOME CARE  Do not drive or use machines. Wait until your doctor says it is safe to do so.   If you have diabetes, check your blood sugar. If it is low (below 70), you need to drink or eat something sweet. If over 300, call your doctor.   If you have a blood pressure machine at home, take your blood pressure. If the top number is below 100 or above 170, call your doctor.   Lie down until you feel normal.   Drink extra fluids (water, juice, soup).  GET HELP RIGHT AWAY IF:   You pass out (faint) when sitting or lying down. Do not drive. Call your local emergency services (911 in [place] if no one is there to help you.   There is chest pain.   You feel sick to your stomach (nauseous) or keep throwing up (vomiting).   You have very bad belly (abdominal) pain.   You feel your heartbeat is fast or not normal.   You lose feeling in some part of the body.   You cannot move your arms or legs.   You cannot talk well and get confused.   You feel weak or cannot see well.   You get sweaty and feel lightheaded.  MAKE SURE YOU:   Understand these instructions.   Will watch your condition.   Will get help right away if you are not doing well or get worse.  Document Released: 01/23/2008 Document Revised: 07/26/2011 Document Reviewed: 01/23/2008 [employer] Patient Information 2012 [place], [place].

## 2011-12-16 NOTE — ED Notes (Signed)
In and out cath done for urine collection. Cloudy pale yellow urine returned with strong odor.

## 2011-12-19 LAB — URINE CULTURE
Colony Count: >=100000
Culture  Setup Time: 201304282117

## 2011-12-19 NOTE — ED Notes (Signed)
+   Urine- MRSA called from [facility]  Lab Macrobid 100 mg sig: one tablet po BID x 7 days written by Neta Ehlers called to [facility] [phone] by Steward Ros PFM. Daughter informed of positive results.

## 2011-12-24 ENCOUNTER — Emergency Department (HOSPITAL_COMMUNITY): Payer: Medicare Other

## 2011-12-24 ENCOUNTER — Encounter (HOSPITAL_COMMUNITY): Payer: Self-pay

## 2011-12-24 ENCOUNTER — Emergency Department (HOSPITAL_COMMUNITY)
Admission: EM | Admit: 2011-12-24 | Discharge: 2011-12-24 | Disposition: A | Discharge status: Home / Self Care | Payer: Medicare Other | Attending: Emergency Medicine | Admitting: Emergency Medicine

## 2011-12-24 DIAGNOSIS — I10 Essential (primary) hypertension: Secondary | ICD-10-CM | POA: Insufficient documentation

## 2011-12-24 DIAGNOSIS — Y92009 Unspecified place in unspecified non-institutional (private) residence as the place of occurrence of the external cause: Secondary | ICD-10-CM | POA: Insufficient documentation

## 2011-12-24 DIAGNOSIS — M810 Age-related osteoporosis without current pathological fracture: Secondary | ICD-10-CM | POA: Insufficient documentation

## 2011-12-24 DIAGNOSIS — Z79899 Other long term (current) drug therapy: Secondary | ICD-10-CM | POA: Insufficient documentation

## 2011-12-24 DIAGNOSIS — K219 Gastro-esophageal reflux disease without esophagitis: Secondary | ICD-10-CM | POA: Insufficient documentation

## 2011-12-24 DIAGNOSIS — Z0389 Encounter for observation for other suspected diseases and conditions ruled out: Secondary | ICD-10-CM | POA: Insufficient documentation

## 2011-12-24 DIAGNOSIS — E785 Hyperlipidemia, unspecified: Secondary | ICD-10-CM | POA: Insufficient documentation

## 2011-12-24 DIAGNOSIS — M25559 Pain in unspecified hip: Secondary | ICD-10-CM | POA: Insufficient documentation

## 2011-12-24 DIAGNOSIS — W19XXXA Unspecified fall, initial encounter: Secondary | ICD-10-CM

## 2011-12-24 DIAGNOSIS — E079 Disorder of thyroid, unspecified: Secondary | ICD-10-CM | POA: Insufficient documentation

## 2011-12-24 DIAGNOSIS — W07XXXA Fall from chair, initial encounter: Secondary | ICD-10-CM | POA: Insufficient documentation

## 2011-12-24 NOTE — ED Notes (Signed)
Family instructed to follow up with primary caregiver to adjust medications; Verbalized understanding

## 2011-12-24 NOTE — ED Notes (Signed)
Back board and C Collar removed after be assessed by MD

## 2011-12-24 NOTE — ED Provider Notes (Signed)
History     CSN: [id number]  Arrival date & time 12/24/11  1450   First MD Initiated Contact with Patient 12/24/11 1458      Chief Complaint  Patient presents with  . Fall    (Consider location/radiation/quality/duration/timing/severity/associated sxs/prior treatment) HPI....Marland Kitchenaccidental fall out of her chair onto the floor. Daughter had given her one pain pill this afternoon. No new complaints at this time. No head or neck trauma. Nothing makes symptoms better or worse. Level V caveat for dementia. Past Medical History  Diagnosis Date  . GERD (gastroesophageal reflux disease)   . Thyroid disease   . Hypertension   . Hyperlipidemia   . Osteoporosis   . Cancer     Past Surgical History  Procedure Date  . Esophagogatroduodenoscopy    . Orif hip fracture 08/31/2011    Procedure: OPEN REDUCTION INTERNAL FIXATION HIP;  Surgeon: Fuller Canada, MD;  Location: [facility];  Service: Orthopedics;  Laterality: Left;  ORIF/Gamma Nail Left Hip  . Eye surgery   . Coronary angioplasty with stent placement     No family history on file.  History  Substance Use Topics  . Smoking status: Never Smoker   . Smokeless tobacco: Current User    Types: Snuff  . Alcohol Use: No    OB History    Grav Para Term Preterm Abortions TAB SAB Ect Mult Living                  Review of Systems  Unable to perform ROS: Dementia    Allergies  Sulfa antibiotics  Home Medications   Current Outpatient Rx  Name Route Sig Dispense Refill  . ASPIRIN 325 MG PO TABS Oral Take 325 mg by mouth daily.    Marland Kitchen NITROFURANTOIN MACROCRYSTAL 100 MG PO CAPS Oral Take 100 mg by mouth 2 (two) times daily. For 7 days    . ACETAMINOPHEN-CODEINE #3 300-30 MG PO TABS Oral Take 1 tablet by mouth every 12 (twelve) hours as needed. with food    . ALENDRONATE SODIUM 70 MG PO TABS Oral Take 70 mg by mouth every 7 (seven) days. Take with a full glass of water on an empty stomach.     . ENOXAPARIN SODIUM 30 MG/0.3ML [place] SOLN  Subcutaneous Inject 0.4 mLs (40 mg total) into the skin daily. Until 10/01/11 8.4 mL   . EZETIMIBE 10 MG PO TABS Oral Take 10 mg by mouth daily.      Marland Kitchen LEVOTHYROXINE SODIUM 100 MCG PO TABS Oral Take 100 mcg by mouth daily.      Marland Kitchen LISINOPRIL 40 MG PO TABS Oral Take 40 mg by mouth daily.      Marland Kitchen METOPROLOL SUCCINATE ER 100 MG PO TB24 Oral Take 100 mg by mouth daily.      Marland Kitchen NITROGLYCERIN 0.4 MG SL SUBL Sublingual Place 0.4 mg under the tongue every 5 (five) minutes as needed. For chest pains    . NYSTATIN-TRIAMCINOLONE 100000-0.1 UNIT/GM-% EX OINT Topical Apply topically 2 (two) times daily. Apply topically to the area just above the buttocks after cleansing gently with soap and water and patting dry with towel, twice daily for diaper rash 30 g 0  . RANITIDINE HCL 150 MG PO TABS Oral Take 150 mg by mouth daily.     Marland Kitchen SIMVASTATIN 80 MG PO TABS Oral Take 80 mg by mouth at bedtime.      BP 196/86  Pulse 76  Temp(Src) 98.6 F (37 C) (Oral)  Resp 20  Ht 5\' 7"  (1.702 m)  Wt 149 lb (67.586 kg)  BMI 23.34 kg/m2  SpO2 98%  Physical Exam  Nursing note and vitals reviewed. Constitutional: She is oriented to person, place, and time. She appears well-developed and well-nourished.  HENT:  Head: Normocephalic and atraumatic.       nohead trauma  Eyes: Conjunctivae and EOM are normal. Pupils are equal, round, and reactive to light.  Neck: Normal range of motion. Neck supple.       No neck trauma  Cardiovascular: Normal rate and regular rhythm.   Pulmonary/Chest: Effort normal and breath sounds normal.  Abdominal: Soft. Bowel sounds are normal.  Musculoskeletal: Normal range of motion.       No obvious bony tenderness  Neurological: She is alert and oriented to person, place, and time.  Skin: Skin is warm and dry.  Psychiatric: She has a normal mood and affect.    ED Course  Procedures (including critical care time)  Labs Reviewed - No data to display Dg Pelvis 1-2 Views  12/24/2011  *RADIOLOGY  REPORT*  Clinical Data: Hip pain, confusion  PELVIS - 1-2 VIEW  Comparison: Pelvis and left hip films of 12/04/2011  Findings: There has been pinning of prior intertrochanteric left femoral fracture with some persistence of the fracture line.  No right hip fracture is seen.  The pelvic rami are intact.  The SI joints appear normal.  IMPRESSION: Fixation of left intertrochanteric femoral fracture.  No acute abnormality.  Original Report Authenticated By: Juline Patch, M.D.     1. Fall       MDM  Patient is complaining of no bony tenderness. Will x-ray pelvis to be certain. No evidence of head or neck trauma        Donnetta Hutching, MD 12/24/11 1714

## 2011-12-24 NOTE — ED Notes (Signed)
Per EMS, pt was sitting in a chair and slid out onto the floor. Pt has been medicated for pain prior to fall. Pt complain of low back pain now.

## 2011-12-24 NOTE — Discharge Instructions (Signed)
X-ray of pelvis shows no fracture.  We'll need to follow up with primary care Dr. For medication adjustment.

## 2011-12-24 NOTE — ED Notes (Signed)
Pt c/o hip pain that changes from L to R side; pt on a backboard and is in a C collar

## 2012-03-06 ENCOUNTER — Ambulatory Visit (INDEPENDENT_AMBULATORY_CARE_PROVIDER_SITE_OTHER): Payer: Medicare Other | Admitting: Orthopedic Surgery

## 2012-03-06 ENCOUNTER — Ambulatory Visit (INDEPENDENT_AMBULATORY_CARE_PROVIDER_SITE_OTHER): Payer: Medicare Other

## 2012-03-06 VITALS — Ht 67.0 in | Wt 149.0 lb

## 2012-03-06 DIAGNOSIS — S72002A Fracture of unspecified part of neck of left femur, initial encounter for closed fracture: Secondary | ICD-10-CM

## 2012-03-06 DIAGNOSIS — S72009A Fracture of unspecified part of neck of unspecified femur, initial encounter for closed fracture: Secondary | ICD-10-CM

## 2012-03-06 NOTE — Patient Instructions (Signed)
activities as tolerated 

## 2012-03-06 NOTE — Progress Notes (Signed)
Patient ID: Allison Collier, female   DOB: 11-13-1920, 78 y.o.   MRN: [id number] Chief Complaint  Patient presents with  . Follow-up    6 month recheck on left hip. DOS 08-31-11    Six-month x-ray proximal left femur/peritrochanteric fracture treated with gamma nail  Patient complains of no pain in the left hip she is ambulatory in the hall without assistive device and in the community with pain, walker or wheelchair  X-ray shows fracture consolidation implants intact  Medical exam is unremarkable  Follow up as needed

## 2012-03-08 ENCOUNTER — Encounter: Payer: Self-pay | Admitting: Orthopedic Surgery

## 2012-11-04 ENCOUNTER — Other Ambulatory Visit: Payer: Self-pay | Admitting: *Deleted

## 2012-11-04 NOTE — Telephone Encounter (Signed)
Opened in error

## 2012-11-10 ENCOUNTER — Ambulatory Visit (INDEPENDENT_AMBULATORY_CARE_PROVIDER_SITE_OTHER): Payer: Medicare Other | Admitting: Family Medicine

## 2012-11-10 VITALS — BP 110/55 | HR 48 | Temp 96.6°F | Ht 65.5 in | Wt 127.2 lb

## 2012-11-10 DIAGNOSIS — B029 Zoster without complications: Secondary | ICD-10-CM

## 2012-11-10 MED ORDER — ACYCLOVIR 400 MG PO TABS: 800.0000 mg | ORAL_TABLET | ORAL | Status: DC

## 2012-11-10 NOTE — Patient Instructions (Signed)

## 2012-11-10 NOTE — Progress Notes (Signed)
Subjective:     Patient ID: Allison Collier, female   DOB: October 08, 1920, 79 y.o.   MRN: [id number]  HPI Allison Collier comes today with 3 days history of a rash burn in the left side of her body. Never had this before did not have the shingles vaccine. It started with discomfort in the abdomen for a couple of days prior to rash and the rash broke out and the abdominal pain is better. No other complaints today Past Medical History  Diagnosis Date  . GERD (gastroesophageal reflux disease)   . Thyroid disease   . Hypertension   . Hyperlipidemia   . Osteoporosis   . Cancer    Past Surgical History  Procedure Laterality Date  . Esophagogatroduodenoscopy     . Orif hip fracture  08/31/2011    Procedure: OPEN REDUCTION INTERNAL FIXATION HIP;  Surgeon: Fuller Canada, MD;  Location: [facility];  Service: Orthopedics;  Laterality: Left;  ORIF/Gamma Nail Left Hip  . Eye surgery    . Coronary angioplasty with stent placement     History   Social History  . Marital Status: Widowed    Spouse Name: N/A    Number of Children: N/A  . Years of Education: N/A   Occupational History  . Not on file.   Social History Main Topics  . Smoking status: Never Smoker   . Smokeless tobacco: Current User    Types: Snuff  . Alcohol Use: No  . Drug Use: No  . Sexually Active: Not on file   Other Topics Concern  . Not on file   Social History Narrative  . No narrative on file   History reviewed. No pertinent family history. Current Outpatient Prescriptions on File Prior to Visit  Medication Sig Dispense Refill  . aspirin 325 MG tablet Take 325 mg by mouth daily.      Marland Kitchen levothyroxine (SYNTHROID, LEVOTHROID) 100 MCG tablet Take 100 mcg by mouth daily.        Marland Kitchen lisinopril (PRINIVIL,ZESTRIL) 40 MG tablet Take 40 mg by mouth daily.        . metoprolol (TOPROL-XL) 100 MG 24 hr tablet Take 100 mg by mouth daily.        . nitroGLYCERIN (NITROSTAT) 0.4 MG SL tablet Place 0.4 mg under the tongue every 5 (five)  minutes as needed. For chest pains      . ranitidine (ZANTAC) 150 MG tablet Take 150 mg by mouth daily.       Marland Kitchen acetaminophen-codeine (TYLENOL #3) 300-30 MG per tablet Take 1 tablet by mouth every 12 (twelve) hours as needed. with food      . alendronate (FOSAMAX) 70 MG tablet Take 70 mg by mouth every 7 (seven) days. Take with a full glass of water on an empty stomach.       . enoxaparin (LOVENOX) 30 MG/0.3ML SOLN Inject 0.4 mLs (40 mg total) into the skin daily. Until 10/01/11  8.4 mL    . ezetimibe (ZETIA) 10 MG tablet Take 10 mg by mouth daily.        . nitrofurantoin (MACRODANTIN) 100 MG capsule Take 100 mg by mouth 2 (two) times daily. For 7 days      . nystatin-triamcinolone ointment (MYCOLOG) Apply topically 2 (two) times daily. Apply topically to the area just above the buttocks after cleansing gently with soap and water and patting dry with towel, twice daily for diaper rash  30 g  0  . simvastatin (ZOCOR) 80 MG tablet  Take 80 mg by mouth at bedtime.       No current facility-administered medications on file prior to visit.   Allergies  Allergen Reactions  . Sulfa Antibiotics    Immunization History  Administered Date(s) Administered  . Influenza Split 09/01/2011  . Influenza Whole 06/20/2010   Prior to Admission medications   Medication Sig Start Date End Date Taking? Authorizing Provider  aspirin 325 MG tablet Take 325 mg by mouth daily.   Yes Historical Provider, MD  levothyroxine (SYNTHROID, LEVOTHROID) 100 MCG tablet Take 100 mcg by mouth daily.     Yes Historical Provider, MD  lisinopril (PRINIVIL,ZESTRIL) 40 MG tablet Take 40 mg by mouth daily.     Yes Historical Provider, MD  metoprolol (TOPROL-XL) 100 MG 24 hr tablet Take 100 mg by mouth daily.     Yes Historical Provider, MD  nitroGLYCERIN (NITROSTAT) 0.4 MG SL tablet Place 0.4 mg under the tongue every 5 (five) minutes as needed. For chest pains   Yes Historical Provider, MD  ranitidine (ZANTAC) 150 MG tablet Take 150  mg by mouth daily.    Yes Historical Provider, MD  acetaminophen-codeine (TYLENOL #3) 300-30 MG per tablet Take 1 tablet by mouth every 12 (twelve) hours as needed. with food    Historical Provider, MD  acyclovir (ZOVIRAX) 400 MG tablet Take 2 tablets (800 mg total) by mouth every 4 (four) hours while awake. Maximum 5 doses per day. For a  total of 7 days 11/10/12   Ileana Ladd, MD  alendronate (FOSAMAX) 70 MG tablet Take 70 mg by mouth every 7 (seven) days. Take with a full glass of water on an empty stomach.     Historical Provider, MD  enoxaparin (LOVENOX) 30 MG/0.3ML SOLN Inject 0.4 mLs (40 mg total) into the skin daily. Until 10/01/11 09/03/11   Erick Blinks, MD  ezetimibe (ZETIA) 10 MG tablet Take 10 mg by mouth daily.      Historical Provider, MD  nitrofurantoin (MACRODANTIN) 100 MG capsule Take 100 mg by mouth 2 (two) times daily. For 7 days 12/19/11   Historical Provider, MD  nystatin-triamcinolone ointment (MYCOLOG) Apply topically 2 (two) times daily. Apply topically to the area just above the buttocks after cleansing gently with soap and water and patting dry with towel, twice daily for diaper rash 11/22/11 11/21/12  Felisa Bonier, MD  simvastatin (ZOCOR) 80 MG tablet Take 80 mg by mouth at bedtime.    Historical Provider, MD    Review of Systems No new complaints on review of systems    Objective:   Physical Exam On examination she appeared in good health and spirits. She ambulates with a walker Well developed, well nourished. Vital signs as documented. BP 110/55  Pulse 48  Temp(Src) 96.6 F (35.9 C) (Oral)  Ht 5' 5.5" (1.664 m)  Wt 127 lb 3.2 oz (57.698 kg)  BMI 20.84 kg/m2  Skin warm and dry . rashes: There is obvious left-sided dermatomal rash of the abdomen corresponding to T. 11 dermatome. It's red maculopapular with a few blisters in the left areas macular areas there is tiny red satellite bumps the left suprailiac area. The appearance of classic shingles is present Head  & Neck without JVD. Lungs clear.  Heart exam notable for regular rhythm, normal sounds and absence of murmurs, rubs or gallops. Abdomen unremarkable and without evidence of organomegaly, masses, or abdominal aortic enlargement.  Breast exam: not performed. Gyn Exam: Not performed. External Genitalia: Vagina:: Cervix: Uterus: Adnexae:  R/V: Extremities nonedematous.    Assessment:     Shingles outbreak      Plan:                                  Medications prescribed: Acyclovir 800 mg 5 times a day for 7 days. Refill x1  Discussed with the Allison Collier and patient shingles the etiology and the skin care necessary.     Will need to follow up in one week to see if any other interventions are needed. In view of the risk for postherpetic neuralgia.  Erique Kaser P. [place] Charon, M.D.

## 2012-11-21 ENCOUNTER — Encounter: Payer: Self-pay | Admitting: Family Medicine

## 2012-11-21 ENCOUNTER — Ambulatory Visit (INDEPENDENT_AMBULATORY_CARE_PROVIDER_SITE_OTHER): Payer: Medicare Other | Admitting: Family Medicine

## 2012-11-21 ENCOUNTER — Ambulatory Visit: Payer: Self-pay | Admitting: General Practice

## 2012-11-21 VITALS — BP 141/78 | HR 77 | Temp 97.4°F | Wt 129.8 lb

## 2012-11-21 DIAGNOSIS — B029 Zoster without complications: Secondary | ICD-10-CM | POA: Insufficient documentation

## 2012-11-21 MED ORDER — ACYCLOVIR 400 MG PO TABS: 800.0000 mg | ORAL_TABLET | ORAL | Status: DC

## 2012-11-21 NOTE — Assessment & Plan Note (Signed)
Still active. Has fresh blisters.T11

## 2012-11-21 NOTE — Progress Notes (Signed)
Patient ID: [name], female   DOB: September 02, 1920, 79 y.o.   MRN: [id number] Subjective:     Patient ID: Allison Collier, female   DOB: Mar 25, 1921, 79 y.o.   MRN: [id number]  HPI Allison Collier comes today with now 13days history of a rash burn in the left side of her body. Never had this before did not have the shingles vaccine. It started with discomfort in the abdomen for a couple of days prior to rash and the rash broke out and the abdominal pain is better.The rash is better in most areas but along the line of the shingles outbreak there is a crop of fresh blisters breaking out.  No other complaints today.   Past Medical History  Diagnosis Date  . GERD (gastroesophageal reflux disease)   . Thyroid disease   . Hypertension   . Hyperlipidemia   . Osteoporosis   . Cancer    Past Surgical History  Procedure Laterality Date  . Esophagogatroduodenoscopy     . Orif hip fracture  08/31/2011    Procedure: OPEN REDUCTION INTERNAL FIXATION HIP;  Surgeon: Fuller Canada, MD;  Location: [facility];  Service: Orthopedics;  Laterality: Left;  ORIF/Gamma Nail Left Hip  . Eye surgery    . Coronary angioplasty with stent placement     History   Social History  . Marital Status: Widowed    Spouse Name: N/A    Number of Children: N/A  . Years of Education: N/A   Occupational History  . Not on file.   Social History Main Topics  . Smoking status: Never Smoker   . Smokeless tobacco: Current User    Types: Snuff  . Alcohol Use: No  . Drug Use: No  . Sexually Active: Not on file   Other Topics Concern  . Not on file   Social History Narrative  . No narrative on file   No family history on file. Current Outpatient Prescriptions on File Prior to Visit  Medication Sig Dispense Refill  . aspirin 325 MG tablet Take 325 mg by mouth daily.      Marland Kitchen ezetimibe (ZETIA) 10 MG tablet Take 10 mg by mouth daily.        Marland Kitchen levothyroxine (SYNTHROID, LEVOTHROID) 100 MCG tablet Take 100 mcg by mouth daily.         Marland Kitchen lisinopril (PRINIVIL,ZESTRIL) 40 MG tablet Take 40 mg by mouth daily.        . metoprolol (TOPROL-XL) 100 MG 24 hr tablet Take 100 mg by mouth daily.        . nitroGLYCERIN (NITROSTAT) 0.4 MG SL tablet Place 0.4 mg under the tongue every 5 (five) minutes as needed. For chest pains      . ranitidine (ZANTAC) 150 MG tablet Take 150 mg by mouth daily.       . simvastatin (ZOCOR) 80 MG tablet Take 80 mg by mouth at bedtime.      Marland Kitchen acetaminophen-codeine (TYLENOL #3) 300-30 MG per tablet Take 1 tablet by mouth every 12 (twelve) hours as needed. with food      . alendronate (FOSAMAX) 70 MG tablet Take 70 mg by mouth every 7 (seven) days. Take with a full glass of water on an empty stomach.       . enoxaparin (LOVENOX) 30 MG/0.3ML SOLN Inject 0.4 mLs (40 mg total) into the skin daily. Until 10/01/11  8.4 mL    . nitrofurantoin (MACRODANTIN) 100 MG capsule Take 100 mg by mouth 2 (  two) times daily. For 7 days      . nystatin-triamcinolone ointment (MYCOLOG) Apply topically 2 (two) times daily. Apply topically to the area just above the buttocks after cleansing gently with soap and water and patting dry with towel, twice daily for diaper rash  30 g  0   No current facility-administered medications on file prior to visit.   Allergies  Allergen Reactions  . Sulfa Antibiotics    Immunization History  Administered Date(s) Administered  . Influenza Split 09/01/2011  . Influenza Whole 06/20/2010   Prior to Admission medications   Medication Sig Start Date End Date Taking? Authorizing Provider  aspirin 325 MG tablet Take 325 mg by mouth daily.   Yes Historical Provider, MD  levothyroxine (SYNTHROID, LEVOTHROID) 100 MCG tablet Take 100 mcg by mouth daily.     Yes Historical Provider, MD  lisinopril (PRINIVIL,ZESTRIL) 40 MG tablet Take 40 mg by mouth daily.     Yes Historical Provider, MD  metoprolol (TOPROL-XL) 100 MG 24 hr tablet Take 100 mg by mouth daily.     Yes Historical Provider, MD   nitroGLYCERIN (NITROSTAT) 0.4 MG SL tablet Place 0.4 mg under the tongue every 5 (five) minutes as needed. For chest pains   Yes Historical Provider, MD  ranitidine (ZANTAC) 150 MG tablet Take 150 mg by mouth daily.    Yes Historical Provider, MD  acetaminophen-codeine (TYLENOL #3) 300-30 MG per tablet Take 1 tablet by mouth every 12 (twelve) hours as needed. with food    Historical Provider, MD  acyclovir (ZOVIRAX) 400 MG tablet Take 2 tablets (800 mg total) by mouth every 4 (four) hours while awake. Maximum 5 doses per day. For a  total of 7 days 11/10/12   Ileana Ladd, MD  alendronate (FOSAMAX) 70 MG tablet Take 70 mg by mouth every 7 (seven) days. Take with a full glass of water on an empty stomach.     Historical Provider, MD  enoxaparin (LOVENOX) 30 MG/0.3ML SOLN Inject 0.4 mLs (40 mg total) into the skin daily. Until 10/01/11 09/03/11   Erick Blinks, MD  ezetimibe (ZETIA) 10 MG tablet Take 10 mg by mouth daily.      Historical Provider, MD  nitrofurantoin (MACRODANTIN) 100 MG capsule Take 100 mg by mouth 2 (two) times daily. For 7 days 12/19/11   Historical Provider, MD  nystatin-triamcinolone ointment (MYCOLOG) Apply topically 2 (two) times daily. Apply topically to the area just above the buttocks after cleansing gently with soap and water and patting dry with towel, twice daily for diaper rash 11/22/11 11/21/12  Felisa Bonier, MD  simvastatin (ZOCOR) 80 MG tablet Take 80 mg by mouth at bedtime.    Historical Provider, MD    Review of Systems No new complaints on review of systems    Objective:   Physical Exam On examination she appeared in good health and spirits. She ambulates with a walker Well developed, well nourished. Vital signs as documented. BP 141/78  Pulse 77  Temp(Src) 97.4 F (36.3 C) (Oral)  Wt 129 lb 12.8 oz (58.877 kg)  BMI 21.26 kg/m2  Skin warm and dry . rashes: There is obvious left-sided dermatomal rash of the abdomen corresponding to T. 11 dermatome. It's red  maculopapular with a few blisters in the left areas macular areas there is tiny red satellite bumps the left suprailiac area. The appearance of classic shingles is present. Most of it now is dry and  Scabbed except for the lateral flank area  where a new crop of blisters has erupted. Head & Neck without JVD. Lungs clear.  Heart exam notable for regular rhythm, normal sounds and absence of murmurs, rubs or gallops. Abdomen unremarkable and without evidence of organomegaly, masses, or abdominal aortic enlargement.  Breast exam: not performed. Gyn Exam: Not performed. External Genitalia: Vagina:: Cervix: Uterus: Adnexae: R/V: Extremities nonedematous.    Assessment:     Shingles outbreak      Plan:                                  Medications prescribed: Acyclovir 800 mg 5 times a day for 7 days. Refilled today again due to the ongoing activity of the Zoster outbreak.  Discussed with the daughter and patient shingles the etiology and the skin care necessary.     Will need to follow up in 10 days to see if any other interventions are needed. In view of the risk for postherpetic neuralgia.  Allison Collier, M.D.

## 2012-12-03 ENCOUNTER — Encounter: Payer: Self-pay | Admitting: Family Medicine

## 2012-12-03 ENCOUNTER — Ambulatory Visit (INDEPENDENT_AMBULATORY_CARE_PROVIDER_SITE_OTHER): Payer: Medicare Other | Admitting: Family Medicine

## 2012-12-03 VITALS — BP 135/80 | HR 114 | Temp 96.6°F | Ht 67.0 in | Wt 121.0 lb

## 2012-12-03 DIAGNOSIS — B029 Zoster without complications: Secondary | ICD-10-CM

## 2012-12-03 NOTE — Progress Notes (Signed)
Patient ID: Allison Collier, female   DOB: 1920-09-18, 79 y.o.   MRN: [id number] SUBJECTIVE: HPI: Shingles finally drying up with minimal discomfort. Came for  Recheck.  PMH/PSH: reviewed/updated in Epic  SH/FH: reviewed/updated in Epic  Allergies: reviewed/updated in Epic  Medications: reviewed/updated in Epic  Immunizations: reviewed/updated in Epic  ROS: As above in the HPI. All other systems are stable or negative.  OBJECTIVE: APPEARANCE:  Patient in no acute distress.The patient appeared well nourished and normally developed. Acyanotic. Waist: VITAL SIGNS:  SKIN: warm and  Dry .shingles rash healed dry scabs, healing or healed in most areas with scars. No dysesthesias, no significant neuralgia.  HEAD and Neck: without JVD, Head and scalp: normal Eyes:No scleral icterus. Fundi normal, eye movements normal. Ears: Auricle normal, canal normal, Tympanic membranes normal, insufflation normal. Nose: normal Throat: normal Neck & thyroid: normal  CHEST & LUNGS: Chest wall: normal Lungs: Clear  CVS: Reveals the PMI to be normally located. Regular rhythm, First and Second Heart sounds are normal,  absence of murmurs, rubs or gallops. Peripheral vasculature: Radial pulses: normal Dorsal pedis pulses: normal Posterior pulses: normal  ABDOMEN:  Appearance: normal Benign,, no organomegaly, no masses, no Abdominal Aortic enlargement. No Guarding , no rebound. No Bruits. Bowel sounds: normal  RECTAL: N/A GU: N/A  EXTREMETIES: nonedematous.  NEUROLOGIC: oriented to person; nonfocal.  ASSESSMENT: Shingles outbreak  PLAN: Continue present treatment, patient has 3 days more left of meds. Continue skin care. RTC 6 weeks for  Routine follow up.  Allison Collier P. [place] Charon, M.D.

## 2013-01-14 ENCOUNTER — Inpatient Hospital Stay (HOSPITAL_COMMUNITY)
Admission: EM | Admit: 2013-01-14 | Discharge: 2013-01-21 | DRG: 291 | Disposition: A | Discharge status: Home Health | Payer: Medicare Other | Attending: Internal Medicine | Admitting: Internal Medicine

## 2013-01-14 ENCOUNTER — Emergency Department (HOSPITAL_COMMUNITY): Payer: Medicare Other

## 2013-01-14 ENCOUNTER — Ambulatory Visit: Payer: Medicare Other | Admitting: Family Medicine

## 2013-01-14 ENCOUNTER — Encounter (HOSPITAL_COMMUNITY): Payer: Self-pay

## 2013-01-14 DIAGNOSIS — J189 Pneumonia, unspecified organism: Secondary | ICD-10-CM | POA: Diagnosis present

## 2013-01-14 DIAGNOSIS — D649 Anemia, unspecified: Secondary | ICD-10-CM | POA: Diagnosis present

## 2013-01-14 DIAGNOSIS — C159 Malignant neoplasm of esophagus, unspecified: Secondary | ICD-10-CM

## 2013-01-14 DIAGNOSIS — M8580 Other specified disorders of bone density and structure, unspecified site: Secondary | ICD-10-CM

## 2013-01-14 DIAGNOSIS — M1990 Unspecified osteoarthritis, unspecified site: Secondary | ICD-10-CM

## 2013-01-14 DIAGNOSIS — R269 Unspecified abnormalities of gait and mobility: Secondary | ICD-10-CM | POA: Diagnosis present

## 2013-01-14 DIAGNOSIS — I509 Heart failure, unspecified: Principal | ICD-10-CM | POA: Diagnosis present

## 2013-01-14 DIAGNOSIS — I5022 Chronic systolic (congestive) heart failure: Secondary | ICD-10-CM | POA: Diagnosis present

## 2013-01-14 DIAGNOSIS — N390 Urinary tract infection, site not specified: Secondary | ICD-10-CM

## 2013-01-14 DIAGNOSIS — E039 Hypothyroidism, unspecified: Secondary | ICD-10-CM

## 2013-01-14 DIAGNOSIS — K219 Gastro-esophageal reflux disease without esophagitis: Secondary | ICD-10-CM | POA: Diagnosis present

## 2013-01-14 DIAGNOSIS — I10 Essential (primary) hypertension: Secondary | ICD-10-CM

## 2013-01-14 DIAGNOSIS — B029 Zoster without complications: Secondary | ICD-10-CM

## 2013-01-14 DIAGNOSIS — W19XXXA Unspecified fall, initial encounter: Secondary | ICD-10-CM | POA: Diagnosis present

## 2013-01-14 DIAGNOSIS — I2510 Atherosclerotic heart disease of native coronary artery without angina pectoris: Secondary | ICD-10-CM

## 2013-01-14 DIAGNOSIS — S62309A Unspecified fracture of unspecified metacarpal bone, initial encounter for closed fracture: Secondary | ICD-10-CM

## 2013-01-14 DIAGNOSIS — J9383 Other pneumothorax: Secondary | ICD-10-CM | POA: Diagnosis not present

## 2013-01-14 DIAGNOSIS — E785 Hyperlipidemia, unspecified: Secondary | ICD-10-CM | POA: Diagnosis present

## 2013-01-14 DIAGNOSIS — S62309D Unspecified fracture of unspecified metacarpal bone, subsequent encounter for fracture with routine healing: Secondary | ICD-10-CM

## 2013-01-14 DIAGNOSIS — Y92009 Unspecified place in unspecified non-institutional (private) residence as the place of occurrence of the external cause: Secondary | ICD-10-CM

## 2013-01-14 DIAGNOSIS — J948 Other specified pleural conditions: Secondary | ICD-10-CM | POA: Diagnosis not present

## 2013-01-14 DIAGNOSIS — J90 Pleural effusion, not elsewhere classified: Secondary | ICD-10-CM | POA: Diagnosis present

## 2013-01-14 DIAGNOSIS — M549 Dorsalgia, unspecified: Secondary | ICD-10-CM

## 2013-01-14 LAB — CBC WITH DIFFERENTIAL/PLATELET
Basophils Absolute: 0 10*3/uL (ref 0.0–0.1)
HCT: 38.9 % (ref 36.0–46.0)
Lymphocytes Relative: 15 % (ref 12–46)
Lymphs Abs: 0.8 10*3/uL (ref 0.7–4.0)
MCV: 100.5 fL — ABNORMAL HIGH (ref 78.0–100.0)
Neutro Abs: 3.8 10*3/uL (ref 1.7–7.7)
Platelets: 137 10*3/uL — ABNORMAL LOW (ref 150–400)
RBC: 3.87 MIL/uL (ref 3.87–5.11)
RDW: 14.1 % (ref 11.5–15.5)
WBC: 4.9 10*3/uL (ref 4.0–10.5)

## 2013-01-14 LAB — URINALYSIS, ROUTINE W REFLEX MICROSCOPIC
Glucose, UA: NEGATIVE mg/dL
Leukocytes, UA: NEGATIVE
Specific Gravity, Urine: 1.02 (ref 1.005–1.030)
pH: 6 (ref 5.0–8.0)

## 2013-01-14 LAB — URINE MICROSCOPIC-ADD ON

## 2013-01-14 LAB — BASIC METABOLIC PANEL
CO2: 29 mEq/L (ref 19–32)
Chloride: 102 mEq/L (ref 96–112)
Glucose, Bld: 111 mg/dL — ABNORMAL HIGH (ref 70–99)
Sodium: 139 mEq/L (ref 135–145)

## 2013-01-14 MED ORDER — HEPARIN SODIUM (PORCINE) 5000 UNIT/ML IJ SOLN
5000.0000 [IU] | Freq: Three times a day (TID) | INTRAMUSCULAR | Status: DC
  Administered 2013-01-14 – 2013-01-15 (×3): 5000 [IU] via SUBCUTANEOUS
  Filled 2013-01-14 (×3): qty 1

## 2013-01-14 MED ORDER — ONDANSETRON HCL 4 MG/2ML IJ SOLN: 4.0000 mg | Freq: Three times a day (TID) | INTRAMUSCULAR | Status: DC | PRN

## 2013-01-14 MED ORDER — NITROGLYCERIN 0.4 MG SL SUBL: 0.4000 mg | SUBLINGUAL_TABLET | SUBLINGUAL | Status: DC | PRN

## 2013-01-14 MED ORDER — IOHEXOL 300 MG/ML  SOLN: 100.0000 mL | Freq: Once | INTRAMUSCULAR | Status: DC | PRN

## 2013-01-14 MED ORDER — ASPIRIN EC 81 MG PO TBEC
81.0000 mg | DELAYED_RELEASE_TABLET | Freq: Every day | ORAL | Status: DC
  Administered 2013-01-15 – 2013-01-21 (×7): 81 mg via ORAL
  Filled 2013-01-14 (×7): qty 1

## 2013-01-14 MED ORDER — POTASSIUM CHLORIDE IN NACL 20-0.9 MEQ/L-% IV SOLN
INTRAVENOUS | Status: DC
Start: 2013-01-14 — End: 2013-01-16
  Administered 2013-01-14 – 2013-01-15 (×2): via INTRAVENOUS
  Administered 2013-01-16: 1000 mL via INTRAVENOUS

## 2013-01-14 MED ORDER — EZETIMIBE 10 MG PO TABS
10.0000 mg | ORAL_TABLET | Freq: Every day | ORAL | Status: DC
  Administered 2013-01-15 – 2013-01-21 (×7): 10 mg via ORAL
  Filled 2013-01-14 (×7): qty 1

## 2013-01-14 MED ORDER — POLYETHYLENE GLYCOL 3350 17 G PO PACK
17.0000 g | PACK | Freq: Every day | ORAL | Status: DC | PRN
  Administered 2013-01-18: 17 g via ORAL
  Filled 2013-01-14: qty 1

## 2013-01-14 MED ORDER — ONDANSETRON HCL 4 MG/2ML IJ SOLN
4.0000 mg | INTRAMUSCULAR | Status: DC | PRN
  Administered 2013-01-16 – 2013-01-17 (×2): 4 mg via INTRAVENOUS
  Filled 2013-01-14 (×2): qty 2

## 2013-01-14 MED ORDER — IOHEXOL 300 MG/ML  SOLN
80.0000 mL | Freq: Once | INTRAMUSCULAR | Status: AC | PRN
  Administered 2013-01-14: 80 mL via INTRAVENOUS

## 2013-01-14 MED ORDER — ATORVASTATIN CALCIUM 40 MG PO TABS
40.0000 mg | ORAL_TABLET | Freq: Every day | ORAL | Status: DC
  Administered 2013-01-15 – 2013-01-20 (×4): 40 mg via ORAL
  Filled 2013-01-14 (×5): qty 1

## 2013-01-14 MED ORDER — FLEET ENEMA 7-19 GM/118ML RE ENEM: 1.0000 | ENEMA | Freq: Once | RECTAL | Status: AC | PRN

## 2013-01-14 MED ORDER — ACETAMINOPHEN 325 MG PO TABS
650.0000 mg | ORAL_TABLET | ORAL | Status: DC | PRN
  Administered 2013-01-15: 650 mg via ORAL
  Filled 2013-01-14: qty 2

## 2013-01-14 MED ORDER — FAMOTIDINE 20 MG PO TABS
20.0000 mg | ORAL_TABLET | Freq: Two times a day (BID) | ORAL | Status: DC
  Administered 2013-01-14 – 2013-01-19 (×10): 20 mg via ORAL
  Filled 2013-01-14 (×10): qty 1

## 2013-01-14 MED ORDER — BISACODYL 10 MG RE SUPP: 10.0000 mg | Freq: Every day | RECTAL | Status: DC | PRN

## 2013-01-14 MED ORDER — ONDANSETRON HCL 4 MG/2ML IJ SOLN
4.0000 mg | Freq: Once | INTRAMUSCULAR | Status: AC
  Administered 2013-01-14: 4 mg via INTRAVENOUS
  Filled 2013-01-14: qty 2

## 2013-01-14 MED ORDER — METOPROLOL SUCCINATE ER 50 MG PO TB24
100.0000 mg | ORAL_TABLET | Freq: Every day | ORAL | Status: DC
  Administered 2013-01-15 – 2013-01-21 (×7): 100 mg via ORAL
  Filled 2013-01-14 (×8): qty 2

## 2013-01-14 MED ORDER — LEVOTHYROXINE SODIUM 100 MCG PO TABS
100.0000 ug | ORAL_TABLET | Freq: Every day | ORAL | Status: DC
  Administered 2013-01-15 – 2013-01-16 (×2): 100 ug via ORAL
  Filled 2013-01-14 (×2): qty 1

## 2013-01-14 MED ORDER — TRAZODONE HCL 50 MG PO TABS: 25.0000 mg | ORAL_TABLET | Freq: Every evening | ORAL | Status: DC | PRN

## 2013-01-14 MED ORDER — LISINOPRIL 10 MG PO TABS
40.0000 mg | ORAL_TABLET | Freq: Every day | ORAL | Status: DC
  Administered 2013-01-15 – 2013-01-21 (×7): 40 mg via ORAL
  Filled 2013-01-14 (×7): qty 4

## 2013-01-14 MED ORDER — SODIUM CHLORIDE 0.9 % IV SOLN: INTRAVENOUS | Status: DC

## 2013-01-14 NOTE — ED Notes (Signed)
ems reports pt has had n/v since yesterday but worse today.   Reports generalized weakness.  Pt slid down off of commode and laid in the floor.  Denies falling but says hit her left little finger on commode.   Says called out to family to help her up.  Family called EMS>  Pt vomiting upon arrival to ed.  EMS administered 4mg  zofran enroute.    Pt denies diarrhea.  Reprots has been taking laxatives for constipation.  LBM was 2 or 3days ago.

## 2013-01-14 NOTE — ED Provider Notes (Signed)
History    Scribed for Shelda Jakes, MD, the patient was seen in room [facility]06/[facility]06. This chart was scribed by Lewanda Rife, ED scribe. Patient's care was started at 1723   CSN: [id number]  Arrival date & time 01/14/13  1540   First MD Initiated Contact with Patient 01/14/13 1646      Chief Complaint  Patient presents with  . Weakness  . Emesis    (Consider location/radiation/quality/duration/timing/severity/associated sxs/prior treatment) The history is provided by the patient and a relative.   HPI Comments: Allison Collier is a 79 y.o. female brought in by ambulance, who presents to the Emergency Department complaining of 1 episode emesis onset following fall earlier today. Reports going to the bathroom because she was feeling nauseous when she fell. Reports left hand pain, sore throat, and healing shingles rash. Pt denies LOC, dysuria, head injury, fever, headache, neck pain, back pain, visual disturbances, bleeding easily, abdominal pain, chest pain, and shortness of breath. Denies recent emesis prior to falling episode. Denies any aggravating or alleviating factors. Denies taking any pain medications PTA to relieve hand pain. Daughter reports pt uses a walker at baseline. Daughter reports hx of falls.    PCP Dr. Modesto Charon   Past Medical History  Diagnosis Date  . GERD (gastroesophageal reflux disease)   . Thyroid disease   . Hypertension   . Hyperlipidemia   . Osteoporosis   . Cancer     Past Surgical History  Procedure Laterality Date  . Esophagogatroduodenoscopy     . Orif hip fracture  08/31/2011    Procedure: OPEN REDUCTION INTERNAL FIXATION HIP;  Surgeon: Fuller Canada, MD;  Location: [facility];  Service: Orthopedics;  Laterality: Left;  ORIF/Gamma Nail Left Hip  . Eye surgery    . Coronary angioplasty with stent placement      No family history on file.  History  Substance Use Topics  . Smoking status: Never Smoker   . Smokeless tobacco: Current User     Types: Snuff  . Alcohol Use: No    OB History   Grav Para Term Preterm Abortions TAB SAB Ect Mult Living                  Review of Systems  Constitutional: Negative for fever.  HENT: Negative for neck pain.   Eyes: Negative for visual disturbance.  Respiratory: Negative for shortness of breath.   Cardiovascular: Negative for chest pain.  Gastrointestinal: Positive for nausea and vomiting. Negative for abdominal pain.  Genitourinary: Negative for dysuria.  Musculoskeletal: Negative for back pain.       Left hand pain    Skin: Positive for rash. Negative for wound.  Neurological: Positive for weakness. Negative for headaches.  Hematological: Does not bruise/bleed easily.   A complete 10 system review of systems was obtained and all systems are negative except as noted in the HPI and PMH.    Allergies  Sulfa antibiotics  Home Medications   Current Outpatient Rx  Name  Route  Sig  Dispense  Refill  . aspirin EC 81 MG tablet   Oral   Take 81 mg by mouth daily.         Marland Kitchen ezetimibe (ZETIA) 10 MG tablet   Oral   Take 10 mg by mouth daily.           . fish oil-omega-3 fatty acids 1000 MG capsule   Oral   Take 2 g by mouth daily.         Marland Kitchen  levothyroxine (SYNTHROID, LEVOTHROID) 100 MCG tablet   Oral   Take 100 mcg by mouth daily.           Marland Kitchen lisinopril (PRINIVIL,ZESTRIL) 40 MG tablet   Oral   Take 40 mg by mouth daily.           . metoprolol (TOPROL-XL) 100 MG 24 hr tablet   Oral   Take 100 mg by mouth daily.          . ranitidine (ZANTAC) 150 MG tablet   Oral   Take 150 mg by mouth daily.          . simvastatin (ZOCOR) 80 MG tablet   Oral   Take 80 mg by mouth at bedtime.         Marland Kitchen acetaminophen-codeine (TYLENOL #3) 300-30 MG per tablet   Oral   Take 1 tablet by mouth every 12 (twelve) hours as needed. with food         . nitroGLYCERIN (NITROSTAT) 0.4 MG SL tablet   Sublingual   Place 0.4 mg under the tongue every 5 (five) minutes as  needed. For chest pains           BP 150/67  Pulse 85  Temp(Src) 97.3 F (36.3 C) (Oral)  Resp 17  SpO2 94%  Physical Exam  Nursing note and vitals reviewed. Constitutional: She is oriented to person, place, and time. She appears well-developed and well-nourished. No distress.  HENT:  Head: Normocephalic and atraumatic.  Mouth/Throat: Oropharynx is clear and moist. No oropharyngeal exudate.  Eyes: EOM are normal.  Neck: Neck supple. No tracheal deviation present.  Cardiovascular: Normal rate and regular rhythm.   No murmur heard. Pulmonary/Chest: Effort normal and breath sounds normal. No respiratory distress.  Abdominal: Soft. Bowel sounds are normal. There is no tenderness.  Musculoskeletal: Normal range of motion. She exhibits no edema and no tenderness.  Bruising noted over 4th and 5th metacarpals over left hand   Neurological: She is alert and oriented to person, place, and time.  Skin: Skin is warm and dry. Rash noted. Rash is not vesicular.  Dermatomal pattern of healing shingles over 2nd lumbar vertebras    Psychiatric: She has a normal mood and affect. Her behavior is normal.    ED Course  Procedures (including critical care time) Medications  iohexol (OMNIPAQUE) 300 MG/ML solution 100 mL (not administered)  0.9 %  sodium chloride infusion (not administered)  ondansetron (ZOFRAN) injection 4 mg (not administered)  ondansetron (ZOFRAN) injection 4 mg (4 mg Intravenous Given 01/14/13 1815)  iohexol (OMNIPAQUE) 300 MG/ML solution 80 mL (80 mLs Intravenous Contrast Given 01/14/13 1852)    Labs Reviewed  CBC WITH DIFFERENTIAL - Abnormal; Notable for the following:    MCV 100.5 (*)    Platelets 137 (*)    All other components within normal limits  BASIC METABOLIC PANEL - Abnormal; Notable for the following:    Glucose, Bld 111 (*)    GFR calc non Af Amer 56 (*)    GFR calc Af Amer 65 (*)    All other components within normal limits  URINALYSIS, ROUTINE W REFLEX  MICROSCOPIC - Abnormal; Notable for the following:    Hgb urine dipstick TRACE (*)    All other components within normal limits  URINE MICROSCOPIC-ADD ON   Dg Hip Bilateral W/pelvis  01/14/2013   *RADIOLOGY REPORT*  Clinical Data: Larey Seat today, history of prior left hip fracture and fixation  BILATERAL HIP WITH PELVIS - 4+  VIEW  Comparison: Left hip films of 03/06/2012  Findings: An intramedullary pin is noted with a screw through the prior left intertrochanteric femoral fracture for fixation.  No acute complication is seen and no evidence of prosthetic motion is noted.  IMPRESSION:   Stable appearance of prior fixation of left intertrochanteric femoral fracture.   Original Report Authenticated By: Dwyane Dee, M.D.   Ct Head Wo Contrast  01/14/2013   *RADIOLOGY REPORT*  Clinical Data:  Weakness, sore throat.  Esophageal cancer  CT HEAD WITHOUT CONTRAST CT CERVICAL SPINE WITHOUT CONTRAST  Technique:  Multidetector CT imaging of the head and cervical spine was performed following the standard protocol without intravenous contrast.  Multiplanar CT image reconstructions of the cervical spine were also generated.  Comparison:  head CT 08/30/2011  CT HEAD  Findings: No acute intracranial hemorrhage.  No focal mass lesion. No CT evidence of acute infarction.   No midline shift or mass effect.  No hydrocephalus.  Basilar cisterns are patent.  Periventricular white matter hypodensities unchanged from prior. Stable atrophy.  Paranasal sinuses and mastoid air cells are clear.  Orbits are normal.  IMPRESSION:  1.  No acute intracranial findings.  2.  Stable atrophy and microvascular disease.  CT CERVICAL SPINE  Findings: No prevertebral soft tissue swelling.  Normal alignment of cervical vertebral bodies.  No loss of vertebral body height. Normal facet articulation.  Normal craniocervical junction.  No evidence epidural or paraspinal hematoma.  There is multilevel endplate osteophytosis.  Small lucent lesions within the  cervical vertebral bodies are felt to relate to osteoporosis.  Limited view of the upper thorax demonstrates a large right pleural effusion.  IMPRESSION:  1.  No acute findings of the cervical spine.  2.  Multilevel disc osteophytic disease. 3.  Large right pleural effusion.   Original Report Authenticated By: Genevive Bi, M.D.   Ct Chest W Contrast  01/14/2013   *RADIOLOGY REPORT*  Clinical Data: Fall, history of esophageal cancer  CT CHEST WITH CONTRAST  Technique:  Multidetector CT imaging of the chest was performed following the standard protocol during bolus administration of intravenous contrast.  Contrast: 80mL OMNIPAQUE IOHEXOL 300 MG/ML  SOLN  Comparison: With the prior chest x-ray 12/16/2011  Findings:  Mediastinum: Unremarkable CT appearance of the thyroid gland.  No suspicious mediastinal or hilar adenopathy.  No soft tissue mediastinal mass.  The thoracic esophagus is unremarkable.  Heart/Vascular: Ectatic bordering on aneurysmally dilated ascending thoracic aorta.  The ascending thoracic aorta measures 4 cm in diameter.  The transverse aorta measures approximately 3 cm in diameter and the descending thoracic aorta 2.4 cm in diameter. There are scattered atherosclerotic vascular calcifications without significant stenosis.  The great vessels are tortuous proximally. Calcifications are noted throughout the coronary arteries. Suggestion of thickening of the aortic valve.  Mild left ventricular dilatation.  Otherwise, the heart is within normal limits for size.  No significant pericardial effusion.  Lungs/Pleura: Large layering right pleural effusion there is associated passive atelectasis of the right lower lobe. Parenchymal scarring in the posterior aspect of the left upper lobe.  Mild diffuse bronchial wall thickening.  Upper Abdomen: Large calcified gallstone within the incompletely imaged gallbladder.  Bilateral renal cysts.  Diffuse calcified splenic artery and origins of the celiac and  mesenteric arteries.  Bones: No acute fracture or aggressive appearing lytic or blastic osseous lesion.  Age indeterminate T7 compression fracture with approximately 50% height loss anteriorly.  No significant posterior bony retropulsion.  IMPRESSION:  1.  Large right layering pleural effusion with associated right lower lobe atelectasis. 2.  Ectatic bordering on aneurysmally dilated ascending thoracic aorta.  Maximal dimension is 4.0 cm 3.  Atherosclerosis including coronary artery disease 4.  Diffuse bilateral bronchial wall thickening suggests underlying chronic bronchitis/COPD 5.  Age indeterminate T7 compression fracture with approximately 50% height loss anteriorly.  A remote/subacute process is favored. Given history of acute fall, recommend clinical correlation for point tenderness and pain in this region.   Original Report Authenticated By: Malachy Moan, M.D.   Ct Cervical Spine Wo Contrast  01/14/2013   *RADIOLOGY REPORT*  Clinical Data:  Weakness, sore throat.  Esophageal cancer  CT HEAD WITHOUT CONTRAST CT CERVICAL SPINE WITHOUT CONTRAST  Technique:  Multidetector CT imaging of the head and cervical spine was performed following the standard protocol without intravenous contrast.  Multiplanar CT image reconstructions of the cervical spine were also generated.  Comparison:  head CT 08/30/2011  CT HEAD  Findings: No acute intracranial hemorrhage.  No focal mass lesion. No CT evidence of acute infarction.   No midline shift or mass effect.  No hydrocephalus.  Basilar cisterns are patent.  Periventricular white matter hypodensities unchanged from prior. Stable atrophy.  Paranasal sinuses and mastoid air cells are clear.  Orbits are normal.  IMPRESSION:  1.  No acute intracranial findings.  2.  Stable atrophy and microvascular disease.  CT CERVICAL SPINE  Findings: No prevertebral soft tissue swelling.  Normal alignment of cervical vertebral bodies.  No loss of vertebral body height. Normal facet  articulation.  Normal craniocervical junction.  No evidence epidural or paraspinal hematoma.  There is multilevel endplate osteophytosis.  Small lucent lesions within the cervical vertebral bodies are felt to relate to osteoporosis.  Limited view of the upper thorax demonstrates a large right pleural effusion.  IMPRESSION:  1.  No acute findings of the cervical spine.  2.  Multilevel disc osteophytic disease. 3.  Large right pleural effusion.   Original Report Authenticated By: Genevive Bi, M.D.   Dg Hand Complete Left  01/14/2013   *RADIOLOGY REPORT*  Clinical Data: Larey Seat today with hand pain and bruising  LEFT HAND - COMPLETE 3+ VIEW  Comparison: None.  Findings: There is an acute angulated fracture of the distal aspect of the left fifth metacarpal with soft tissue swelling.  No other acute abnormality is seen.  IMPRESSION: Acute angulated fracture of the distal left fifth metacarpal.   Original Report Authenticated By: Dwyane Dee, M.D.    Date: 01/14/2013  Rate: 88  Rhythm: normal sinus rhythm and premature atrial contractions (PAC)  QRS Axis: normal  Intervals: normal  ST/T Wave abnormalities: nonspecific ST/T changes  Conduction Disutrbances:none  Narrative Interpretation:   Old EKG Reviewed: none available  Results for orders placed during the hospital encounter of 01/14/13  CBC WITH DIFFERENTIAL      Result Value Range   WBC 4.9  4.0 - 10.5 K/uL   RBC 3.87  3.87 - 5.11 MIL/uL   Hemoglobin 12.7  12.0 - 15.0 g/dL   HCT 16.1  09.6 - 04.5 %   MCV 100.5 (*) 78.0 - 100.0 fL   MCH 32.8  26.0 - 34.0 pg   MCHC 32.6  30.0 - 36.0 g/dL   RDW 40.9  81.1 - 91.4 %   Platelets 137 (*) 150 - 400 K/uL   Neutrophils Relative % 77  43 - 77 %   Neutro Abs 3.8  1.7 - 7.7 K/uL   Lymphocytes Relative 15  12 - 46 %   Lymphs Abs 0.8  0.7 - 4.0 K/uL   Monocytes Relative 6  3 - 12 %   Monocytes Absolute 0.3  0.1 - 1.0 K/uL   Eosinophils Relative 2  0 - 5 %   Eosinophils Absolute 0.1  0.0 - 0.7 K/uL    Basophils Relative 0  0 - 1 %   Basophils Absolute 0.0  0.0 - 0.1 K/uL  BASIC METABOLIC PANEL      Result Value Range   Sodium 139  135 - 145 mEq/L   Potassium 4.4  3.5 - 5.1 mEq/L   Chloride 102  96 - 112 mEq/L   CO2 29  19 - 32 mEq/L   Glucose, Bld 111 (*) 70 - 99 mg/dL   BUN 18  6 - 23 mg/dL   Creatinine, Ser 6.04  0.50 - 1.10 mg/dL   Calcium 9.8  8.4 - 54.0 mg/dL   GFR calc non Af Amer 56 (*) >90 mL/min   GFR calc Af Amer 65 (*) >90 mL/min  URINALYSIS, ROUTINE W REFLEX MICROSCOPIC      Result Value Range   Color, Urine YELLOW  YELLOW   APPearance CLEAR  CLEAR   Specific Gravity, Urine 1.020  1.005 - 1.030   pH 6.0  5.0 - 8.0   Glucose, UA NEGATIVE  NEGATIVE mg/dL   Hgb urine dipstick TRACE (*) NEGATIVE   Bilirubin Urine NEGATIVE  NEGATIVE   Ketones, ur NEGATIVE  NEGATIVE mg/dL   Protein, ur NEGATIVE  NEGATIVE mg/dL   Urobilinogen, UA 1.0  0.0 - 1.0 mg/dL   Nitrite NEGATIVE  NEGATIVE   Leukocytes, UA NEGATIVE  NEGATIVE  URINE MICROSCOPIC-ADD ON      Result Value Range   Squamous Epithelial / LPF RARE  RARE   WBC, UA 0-2  <3 WBC/hpf   RBC / HPF 0-2  <3 RBC/hpf   Bacteria, UA RARE  RARE     1. Fall, initial encounter   2. Metacarpal bone fracture, closed, initial encounter   3. Pleural effusion on right       MDM  Patient with unexpected finding of a large right pleural effusion. Also with the near syncopal episode that occurred today with vomiting patient has a fracture to her fifth metacarpal bone which has been splinted with an ulnar gutter splint. Patient still feeling weak arches and saturations have been borderline around 91% on room air. Patient will be admitted to telemetry by the hospitalist. Temporary middle orders completed. No evidence urinary tract infection. Electrolytes without significant abnormalities renal function is normal. No significant leukocytosis. The cause of the pleural effusion is not clear.     I personally performed the services  described in this documentation, which was scribed in my presence. The recorded information has been reviewed and is accurate.      Shelda Jakes, MD 01/14/13 [phone]

## 2013-01-14 NOTE — H&P (Signed)
[facility] History and Physical  Allison Collier  ZOX:[id number]  DOB: 23-Oct-1920   DOA: 01/14/2013   PCP:   Rudi Heap, MD   Chief Complaint:  Syncope  HPI: Allison Collier is an 79 y.o. female.   Elderly Caucasian lady with osteoarthritis usually mobile with a walker, has been feeling nauseous for the past couple of days, and this afternoon and got up to go to the bathroom because of her nausea, did not use a walker or a cane, and her daughter heard her fall in the bathroom. She denies any loss of consciousness she denies hitting her head.  She was transported to the emergency room and was extensively evaluated with x-rays; other than fractures of the left hand no acute abnormality was found. However chest CT revealed a large right pleural effusion and the hospitalist service was called to assist.   On further review patient and her daughter who is present feel that she has been coughing for the past few days and possible weeks, but the cough is nonproductive. They deny history of heart failure. She was treated for pneumonia some months ago; she did have an outbreak of shingles 2 months ago which is normal almost completely healed. She has actually had a recurrent falls due to failure to use a walker at home.  Rewiew of Systems:   All systems negative except as marked bold or noted in the HPI;   the review of systems was somewhat challenging as patient's seems to try to answer yes to most things, but it does appear that she has not been having many acute problems apart from her current falls, nausea and constipation. Her daughter who lives with her also appears unable to give a focused review of systems, and repeatedly drifts off into talking about her own personal health problems.   Past Medical History  Diagnosis Date  . GERD (gastroesophageal reflux disease)   . Thyroid disease   . Hypertension   . Hyperlipidemia   . Osteoporosis   . Cancer     Past Surgical History   Procedure Laterality Date  . Esophagogatroduodenoscopy     . Orif hip fracture  08/31/2011    Procedure: OPEN REDUCTION INTERNAL FIXATION HIP;  Surgeon: Fuller Canada, MD;  Location: [facility];  Service: Orthopedics;  Laterality: Left;  ORIF/Gamma Nail Left Hip  . Eye surgery    . Coronary angioplasty with stent placement      Medications:  HOME MEDS: Prior to Admission medications   Medication Sig Start Date End Date Taking? Authorizing Provider  aspirin EC 81 MG tablet Take 81 mg by mouth daily.   Yes Historical Provider, MD  ezetimibe (ZETIA) 10 MG tablet Take 10 mg by mouth daily.     Yes Historical Provider, MD  fish oil-omega-3 fatty acids 1000 MG capsule Take 2 g by mouth daily.   Yes Historical Provider, MD  levothyroxine (SYNTHROID, LEVOTHROID) 100 MCG tablet Take 100 mcg by mouth daily.     Yes Historical Provider, MD  lisinopril (PRINIVIL,ZESTRIL) 40 MG tablet Take 40 mg by mouth daily.     Yes Historical Provider, MD  metoprolol (TOPROL-XL) 100 MG 24 hr tablet Take 100 mg by mouth daily.    Yes Historical Provider, MD  ranitidine (ZANTAC) 150 MG tablet Take 150 mg by mouth daily.    Yes Historical Provider, MD  simvastatin (ZOCOR) 80 MG tablet Take 80 mg by mouth at bedtime.   Yes Historical Provider, MD  acetaminophen-codeine (  TYLENOL #3) 300-30 MG per tablet Take 1 tablet by mouth every 12 (twelve) hours as needed. with food    Historical Provider, MD  nitroGLYCERIN (NITROSTAT) 0.4 MG SL tablet Place 0.4 mg under the tongue every 5 (five) minutes as needed. For chest pains    Historical Provider, MD     Allergies:  Allergies  Allergen Reactions  . Sulfa Antibiotics     Reaction unknown    Social History:   reports that she has never smoked. Her smokeless tobacco use includes Snuff. She reports that she does not drink alcohol or use illicit drugs.  Family History: No family history on file.   Physical Exam: Filed Vitals:   01/14/13 1545 01/14/13 1655 01/14/13  2033 01/14/13 2232  BP: 173/87 132/69 150/67 198/86  Pulse: 94 87 85 102  Temp: 97.3 F (36.3 C)   98.3 F (36.8 C)  TempSrc: Oral   Oral  Resp: 18 18 17 20   Height:    5\' 7"  (1.702 m)  Weight:    58.695 kg (129 lb 6.4 oz)  SpO2: 94% 94% 94% 93%   Blood pressure 198/86, pulse 102, temperature 98.3 F (36.8 C), temperature source Oral, resp. rate 20, height 5\' 7"  (1.702 m), weight 58.695 kg (129 lb 6.4 oz), SpO2 93.00%.  GEN:  Pleasant elderly Caucasian lady reclining in bed in no acute distress; cooperative with exam PSYCH:  alert and oriented x4;  does appear somewhat; affect is appropriate. HEENT: Mucous membranes pink and anicteric; PERRLA; EOM intact; no cervical lymphadenopathy nor thyromegaly or carotid bruit; no JVD; Breasts:: Not examined CHEST WALL: No tenderness CHEST: Normal respiration,  decreased breath sounds right chest; dull to percussion right chest; no crackles  HEART: Regular rate and rhythm; no murmurs rubs or gallops BACK: mild kyphosis no scoliosis; no spinal tenderness ;no CVA tenderness ABDOMEN: soft non-tender; no masses, no organomegaly, normal abdominal bowel sounds; no pannus; no intertriginous candida. Rectal Exam: Not done EXTREMITIES: age-appropriate arthropathy of the hands and knees; no edema; no ulcerations. Genitalia: not examined PULSES: 2+ and symmetric SKIN: Normal hydration no rash or ulceration; Ecchymosis of the left shin status post remote fall CNS: Cranial nerves 2-12 grossly intact no focal lateralizing neurologic deficit   Labs on Admission:  Basic Metabolic Panel:  Recent Labs Lab 01/14/13 1611  NA 139  K 4.4  CL 102  CO2 29  GLUCOSE 111*  BUN 18  CREATININE 0.89  CALCIUM 9.8   Liver Function Tests: No results found for this basename: AST, ALT, ALKPHOS, BILITOT, PROT, ALBUMIN,  in the last 168 hours No results found for this basename: LIPASE, AMYLASE,  in the last 168 hours No results found for this basename: AMMONIA,  in  the last 168 hours CBC:  Recent Labs Lab 01/14/13 1611  WBC 4.9  NEUTROABS 3.8  HGB 12.7  HCT 38.9  MCV 100.5*  PLT 137*   Cardiac Enzymes: No results found for this basename: CKTOTAL, CKMB, CKMBINDEX, TROPONINI,  in the last 168 hours BNP: No components found with this basename: POCBNP,  D-dimer: No components found with this basename: D-DIMER,  CBG: No results found for this basename: GLUCAP,  in the last 168 hours  Radiological Exams on Admission: Dg Hip Bilateral W/pelvis  01/14/2013   *RADIOLOGY REPORT*  Clinical Data: Larey Seat today, history of prior left hip fracture and fixation  BILATERAL HIP WITH PELVIS - 4+ VIEW  Comparison: Left hip films of 03/06/2012  Findings: An intramedullary pin is noted  with a screw through the prior left intertrochanteric femoral fracture for fixation.  No acute complication is seen and no evidence of prosthetic motion is noted.  IMPRESSION:   Stable appearance of prior fixation of left intertrochanteric femoral fracture.   Original Report Authenticated By: Dwyane Dee, M.D.   Ct Head Wo Contrast  01/14/2013   *RADIOLOGY REPORT*  Clinical Data:  Weakness, sore throat.  Esophageal cancer  CT HEAD WITHOUT CONTRAST CT CERVICAL SPINE WITHOUT CONTRAST  Technique:  Multidetector CT imaging of the head and cervical spine was performed following the standard protocol without intravenous contrast.  Multiplanar CT image reconstructions of the cervical spine were also generated.  Comparison:  head CT 08/30/2011  CT HEAD  Findings: No acute intracranial hemorrhage.  No focal mass lesion. No CT evidence of acute infarction.   No midline shift or mass effect.  No hydrocephalus.  Basilar cisterns are patent.  Periventricular white matter hypodensities unchanged from prior. Stable atrophy.  Paranasal sinuses and mastoid air cells are clear.  Orbits are normal.  IMPRESSION:  1.  No acute intracranial findings.  2.  Stable atrophy and microvascular disease.  CT CERVICAL SPINE   Findings: No prevertebral soft tissue swelling.  Normal alignment of cervical vertebral bodies.  No loss of vertebral body height. Normal facet articulation.  Normal craniocervical junction.  No evidence epidural or paraspinal hematoma.  There is multilevel endplate osteophytosis.  Small lucent lesions within the cervical vertebral bodies are felt to relate to osteoporosis.  Limited view of the upper thorax demonstrates a large right pleural effusion.  IMPRESSION:  1.  No acute findings of the cervical spine.  2.  Multilevel disc osteophytic disease. 3.  Large right pleural effusion.   Original Report Authenticated By: Genevive Bi, M.D.   Ct Chest W Contrast  01/14/2013   *RADIOLOGY REPORT*  Clinical Data: Fall, history of esophageal cancer  CT CHEST WITH CONTRAST  Technique:  Multidetector CT imaging of the chest was performed following the standard protocol during bolus administration of intravenous contrast.  Contrast: 80mL OMNIPAQUE IOHEXOL 300 MG/ML  SOLN  Comparison: With the prior chest x-ray 12/16/2011  Findings:  Mediastinum: Unremarkable CT appearance of the thyroid gland.  No suspicious mediastinal or hilar adenopathy.  No soft tissue mediastinal mass.  The thoracic esophagus is unremarkable.  Heart/Vascular: Ectatic bordering on aneurysmally dilated ascending thoracic aorta.  The ascending thoracic aorta measures 4 cm in diameter.  The transverse aorta measures approximately 3 cm in diameter and the descending thoracic aorta 2.4 cm in diameter. There are scattered atherosclerotic vascular calcifications without significant stenosis.  The great vessels are tortuous proximally. Calcifications are noted throughout the coronary arteries. Suggestion of thickening of the aortic valve.  Mild left ventricular dilatation.  Otherwise, the heart is within normal limits for size.  No significant pericardial effusion.  Lungs/Pleura: Large layering right pleural effusion there is associated passive atelectasis  of the right lower lobe. Parenchymal scarring in the posterior aspect of the left upper lobe.  Mild diffuse bronchial wall thickening.  Upper Abdomen: Large calcified gallstone within the incompletely imaged gallbladder.  Bilateral renal cysts.  Diffuse calcified splenic artery and origins of the celiac and mesenteric arteries.  Bones: No acute fracture or aggressive appearing lytic or blastic osseous lesion.  Age indeterminate T7 compression fracture with approximately 50% height loss anteriorly.  No significant posterior bony retropulsion.  IMPRESSION:  1.  Large right layering pleural effusion with associated right lower lobe atelectasis. 2.  Ectatic bordering  on aneurysmally dilated ascending thoracic aorta.  Maximal dimension is 4.0 cm 3.  Atherosclerosis including coronary artery disease 4.  Diffuse bilateral bronchial wall thickening suggests underlying chronic bronchitis/COPD 5.  Age indeterminate T7 compression fracture with approximately 50% height loss anteriorly.  A remote/subacute process is favored. Given history of acute fall, recommend clinical correlation for point tenderness and pain in this region.   Original Report Authenticated By: Malachy Moan, M.D.   Ct Cervical Spine Wo Contrast  01/14/2013   *RADIOLOGY REPORT*  Clinical Data:  Weakness, sore throat.  Esophageal cancer  CT HEAD WITHOUT CONTRAST CT CERVICAL SPINE WITHOUT CONTRAST  Technique:  Multidetector CT imaging of the head and cervical spine was performed following the standard protocol without intravenous contrast.  Multiplanar CT image reconstructions of the cervical spine were also generated.  Comparison:  head CT 08/30/2011  CT HEAD  Findings: No acute intracranial hemorrhage.  No focal mass lesion. No CT evidence of acute infarction.   No midline shift or mass effect.  No hydrocephalus.  Basilar cisterns are patent.  Periventricular white matter hypodensities unchanged from prior. Stable atrophy.  Paranasal sinuses and  mastoid air cells are clear.  Orbits are normal.  IMPRESSION:  1.  No acute intracranial findings.  2.  Stable atrophy and microvascular disease.  CT CERVICAL SPINE  Findings: No prevertebral soft tissue swelling.  Normal alignment of cervical vertebral bodies.  No loss of vertebral body height. Normal facet articulation.  Normal craniocervical junction.  No evidence epidural or paraspinal hematoma.  There is multilevel endplate osteophytosis.  Small lucent lesions within the cervical vertebral bodies are felt to relate to osteoporosis.  Limited view of the upper thorax demonstrates a large right pleural effusion.  IMPRESSION:  1.  No acute findings of the cervical spine.  2.  Multilevel disc osteophytic disease. 3.  Large right pleural effusion.   Original Report Authenticated By: Genevive Bi, M.D.   Dg Hand Complete Left  01/14/2013   *RADIOLOGY REPORT*  Clinical Data: Larey Seat today with hand pain and bruising  LEFT HAND - COMPLETE 3+ VIEW  Comparison: None.  Findings: There is an acute angulated fracture of the distal aspect of the left fifth metacarpal with soft tissue swelling.  No other acute abnormality is seen.  IMPRESSION: Acute angulated fracture of the distal left fifth metacarpal.   Original Report Authenticated By: Dwyane Dee, M.D.      Assessment/Plan Present on Admission:   Pleural effusion on right  Possible late manifestation of a chronic condition . Hypothyroid . Hyperlipidemia . Osteoarthritis and gait instability  Recurrent falls likely due to gait instability  . GERD (gastroesophageal reflux disease)  PLAN:  Will get ultrasound guided thoracocentesis to evaluate pleural fluid; consider pulmonary consult; Will maintain on telemetry and get a 2-D echo;  physical therapy consult for recurrent falls  Other plans as per orders.  Code Status:  full code for the time being; to be rediscuss  Family Communication:  plans discussed with patient and daughter at  bedside Disposition Plan:  likely home in2 or 3 days   Allison Collier Nocturnist [facility] Pager [phone]   01/14/2013, 11:25 PM

## 2013-01-15 ENCOUNTER — Inpatient Hospital Stay (HOSPITAL_COMMUNITY): Payer: Medicare Other

## 2013-01-15 DIAGNOSIS — S5290XD Unspecified fracture of unspecified forearm, subsequent encounter for closed fracture with routine healing: Secondary | ICD-10-CM

## 2013-01-15 DIAGNOSIS — I359 Nonrheumatic aortic valve disorder, unspecified: Secondary | ICD-10-CM

## 2013-01-15 DIAGNOSIS — S62309A Unspecified fracture of unspecified metacarpal bone, initial encounter for closed fracture: Secondary | ICD-10-CM

## 2013-01-15 DIAGNOSIS — W19XXXA Unspecified fall, initial encounter: Secondary | ICD-10-CM

## 2013-01-15 LAB — BODY FLUID CELL COUNT WITH DIFFERENTIAL
Eos, Fluid: 0 %
Lymphs, Fluid: 58 %
Monocyte-Macrophage-Serous Fluid: 13 % — ABNORMAL LOW (ref 50–90)
Neutrophil Count, Fluid: 29 % — ABNORMAL HIGH (ref 0–25)
Other Cells, Fluid: 0 %

## 2013-01-15 LAB — BASIC METABOLIC PANEL
BUN: 18 mg/dL (ref 6–23)
Calcium: 9.1 mg/dL (ref 8.4–10.5)
GFR calc Af Amer: 63 mL/min — ABNORMAL LOW (ref >90)
GFR calc non Af Amer: 54 mL/min — ABNORMAL LOW (ref >90)
Potassium: 5 mEq/L (ref 3.5–5.1)
Sodium: 137 mEq/L (ref 135–145)

## 2013-01-15 LAB — MRSA PCR SCREENING: MRSA by PCR: POSITIVE — AB

## 2013-01-15 LAB — CBC
Hemoglobin: 11.4 g/dL — ABNORMAL LOW (ref 12.0–15.0)
MCHC: 32.8 g/dL (ref 30.0–36.0)
Platelets: 130 10*3/uL — ABNORMAL LOW (ref 150–400)
RBC: 3.47 MIL/uL — ABNORMAL LOW (ref 3.87–5.11)

## 2013-01-15 LAB — LACTATE DEHYDROGENASE: LDH: 154 U/L (ref 94–250)

## 2013-01-15 LAB — GLUCOSE, SEROUS FLUID: Glucose, Fluid: 111 mg/dL

## 2013-01-15 LAB — AMYLASE, BODY FLUID: Amylase, Fluid: 60 U/L

## 2013-01-15 LAB — HEPATIC FUNCTION PANEL
Albumin: 3 g/dL — ABNORMAL LOW (ref 3.5–5.2)
Alkaline Phosphatase: 74 U/L (ref 39–117)
Indirect Bilirubin: 0.4 mg/dL (ref 0.3–0.9)
Total Protein: 6 g/dL (ref 6.0–8.3)

## 2013-01-15 LAB — PROTEIN, BODY FLUID

## 2013-01-15 MED ORDER — MUPIROCIN 2 % EX OINT
1.0000 "application " | TOPICAL_OINTMENT | Freq: Two times a day (BID) | CUTANEOUS | Status: AC
  Administered 2013-01-15 – 2013-01-20 (×10): 1 via NASAL
  Filled 2013-01-15: qty 22

## 2013-01-15 MED ORDER — CHLORHEXIDINE GLUCONATE CLOTH 2 % EX PADS
6.0000 | MEDICATED_PAD | Freq: Every day | CUTANEOUS | Status: AC
  Administered 2013-01-16 – 2013-01-20 (×5): 6 via TOPICAL

## 2013-01-15 NOTE — Progress Notes (Signed)
*  PRELIMINARY RESULTS* Echocardiogram 2D Echocardiogram has been performed.  [facility] 01/15/2013, 12:07 PM

## 2013-01-15 NOTE — Progress Notes (Signed)
[facility] PROGRESS NOTE  Allison Collier RUE:[id number] DOB: Aug 12, 1921 DOA: 01/14/2013 PCP: Rudi Heap, MD  Assessment/Plan: Pleural effusion on right  Possible late manifestation of a chronic condition : s/p thoracentesis per radiology. Cytology pending.   HTN: only fair control. May be related to pain left hand. Continue home meds and monitor.   Fx left fifth metacarpal: splint in dry and intact. Fingers warm to touch.   . Hypothyroid: TSH in process. Continue synthroid  . Hyperlipidemia : continue statin  . Osteoarthritis and gait instability : will request PT evaluation Recurrent falls likely due to gait instability   . GERD (gastroesophageal reflux disease): stable at baseline.     Code Status: full Family Communication: daughter at bedside Disposition Plan: may benefit from SNF   Consultants:  none  Procedures:  Thoracentesis 01/15/13  Antibiotics:  none  HPI/Subjective: Awake alert oriented to self and place. Denies pain  Objective: Filed Vitals:   01/14/13 2033 01/14/13 2232 01/15/13 0246 01/15/13 0824  BP: 150/67 198/86 146/80 164/80  Pulse: 85 102 104 95  Temp:  98.3 F (36.8 C) 99 F (37.2 C)   TempSrc:  Oral Oral   Resp: 17 20 20    Height:  5\' 7"  (1.702 m)    Weight:  58.695 kg (129 lb 6.4 oz)    SpO2: 94% 93% 92%     Intake/Output Summary (Last 24 hours) at 01/15/13 1255 Last data filed at 01/15/13 0928  Gross per 24 hour  Intake    120 ml  Output    400 ml  Net   -280 ml   Filed Weights   01/14/13 2232  Weight: 58.695 kg (129 lb 6.4 oz)    Exam:   General:  Thin frail NAD  Cardiovascular: RRR No MGR No LE edema  Respiratory: normal effort somewhat shallow. Diminished bilateral bases no wheeze no rhonchi  Abdomen: flat soft +BS non-tender to palpation  Musculoskeletal: no clubbing no cyanosis   Data Reviewed: Basic Metabolic Panel:  Recent Labs Lab 01/14/13 1611 01/15/13 0526  NA 139 137  K 4.4 5.0  CL  102 103  CO2 29 28  GLUCOSE 111* 83  BUN 18 18  CREATININE 0.89 0.91  CALCIUM 9.8 9.1   Liver Function Tests:  Recent Labs Lab 01/15/13 0530  AST 15  ALT 10  ALKPHOS 74  BILITOT 0.5  PROT 6.0  ALBUMIN 3.0*   No results found for this basename: LIPASE, AMYLASE,  in the last 168 hours No results found for this basename: AMMONIA,  in the last 168 hours CBC:  Recent Labs Lab 01/14/13 1611 01/15/13 0526  WBC 4.9 4.8  NEUTROABS 3.8  --   HGB 12.7 11.4*  HCT 38.9 34.8*  MCV 100.5* 100.3*  PLT 137* 130*   Cardiac Enzymes: No results found for this basename: CKTOTAL, CKMB, CKMBINDEX, TROPONINI,  in the last 168 hours BNP (last 3 results) No results found for this basename: PROBNP,  in the last 8760 hours CBG: No results found for this basename: GLUCAP,  in the last 168 hours  No results found for this or any previous visit (from the past 240 hour(s)).   Studies: Dg Chest 1 View  01/15/2013   *RADIOLOGY REPORT*  Clinical Data: Right pleural effusion post thoracentesis  CHEST - 1 VIEW  Comparison: Chest CT 01/14/2013  Findings: Normal heart size and pulmonary vascularity. Tortuous aorta with atherosclerotic calcification. Marked decrease in right pleural effusion with minimal residual pleural effusion noted  at right costophrenic angle. No pneumothorax. Minimal residual right basilar atelectasis. Lungs otherwise clear with question underlying emphysematous changes. Bones demineralized. No pneumothorax.  IMPRESSION: Marked decrease in right pleural effusion and basilar atelectasis post thoracentesis. No pneumothorax.   Original Report Authenticated By: Ulyses Southward, M.D.   Dg Hip Bilateral W/pelvis  01/14/2013   *RADIOLOGY REPORT*  Clinical Data: Larey Seat today, history of prior left hip fracture and fixation  BILATERAL HIP WITH PELVIS - 4+ VIEW  Comparison: Left hip films of 03/06/2012  Findings: An intramedullary pin is noted with a screw through the prior left intertrochanteric  femoral fracture for fixation.  No acute complication is seen and no evidence of prosthetic motion is noted.  IMPRESSION:   Stable appearance of prior fixation of left intertrochanteric femoral fracture.   Original Report Authenticated By: Dwyane Dee, M.D.   Ct Head Wo Contrast  01/14/2013   *RADIOLOGY REPORT*  Clinical Data:  Weakness, sore throat.  Esophageal cancer  CT HEAD WITHOUT CONTRAST CT CERVICAL SPINE WITHOUT CONTRAST  Technique:  Multidetector CT imaging of the head and cervical spine was performed following the standard protocol without intravenous contrast.  Multiplanar CT image reconstructions of the cervical spine were also generated.  Comparison:  head CT 08/30/2011  CT HEAD  Findings: No acute intracranial hemorrhage.  No focal mass lesion. No CT evidence of acute infarction.   No midline shift or mass effect.  No hydrocephalus.  Basilar cisterns are patent.  Periventricular white matter hypodensities unchanged from prior. Stable atrophy.  Paranasal sinuses and mastoid air cells are clear.  Orbits are normal.  IMPRESSION:  1.  No acute intracranial findings.  2.  Stable atrophy and microvascular disease.  CT CERVICAL SPINE  Findings: No prevertebral soft tissue swelling.  Normal alignment of cervical vertebral bodies.  No loss of vertebral body height. Normal facet articulation.  Normal craniocervical junction.  No evidence epidural or paraspinal hematoma.  There is multilevel endplate osteophytosis.  Small lucent lesions within the cervical vertebral bodies are felt to relate to osteoporosis.  Limited view of the upper thorax demonstrates a large right pleural effusion.  IMPRESSION:  1.  No acute findings of the cervical spine.  2.  Multilevel disc osteophytic disease. 3.  Large right pleural effusion.   Original Report Authenticated By: Genevive Bi, M.D.   Ct Chest W Contrast  01/14/2013   *RADIOLOGY REPORT*  Clinical Data: Fall, history of esophageal cancer  CT CHEST WITH CONTRAST   Technique:  Multidetector CT imaging of the chest was performed following the standard protocol during bolus administration of intravenous contrast.  Contrast: 80mL OMNIPAQUE IOHEXOL 300 MG/ML  SOLN  Comparison: With the prior chest x-ray 12/16/2011  Findings:  Mediastinum: Unremarkable CT appearance of the thyroid gland.  No suspicious mediastinal or hilar adenopathy.  No soft tissue mediastinal mass.  The thoracic esophagus is unremarkable.  Heart/Vascular: Ectatic bordering on aneurysmally dilated ascending thoracic aorta.  The ascending thoracic aorta measures 4 cm in diameter.  The transverse aorta measures approximately 3 cm in diameter and the descending thoracic aorta 2.4 cm in diameter. There are scattered atherosclerotic vascular calcifications without significant stenosis.  The great vessels are tortuous proximally. Calcifications are noted throughout the coronary arteries. Suggestion of thickening of the aortic valve.  Mild left ventricular dilatation.  Otherwise, the heart is within normal limits for size.  No significant pericardial effusion.  Lungs/Pleura: Large layering right pleural effusion there is associated passive atelectasis of the right lower lobe. Parenchymal scarring  in the posterior aspect of the left upper lobe.  Mild diffuse bronchial wall thickening.  Upper Abdomen: Large calcified gallstone within the incompletely imaged gallbladder.  Bilateral renal cysts.  Diffuse calcified splenic artery and origins of the celiac and mesenteric arteries.  Bones: No acute fracture or aggressive appearing lytic or blastic osseous lesion.  Age indeterminate T7 compression fracture with approximately 50% height loss anteriorly.  No significant posterior bony retropulsion.  IMPRESSION:  1.  Large right layering pleural effusion with associated right lower lobe atelectasis. 2.  Ectatic bordering on aneurysmally dilated ascending thoracic aorta.  Maximal dimension is 4.0 cm 3.  Atherosclerosis including  coronary artery disease 4.  Diffuse bilateral bronchial wall thickening suggests underlying chronic bronchitis/COPD 5.  Age indeterminate T7 compression fracture with approximately 50% height loss anteriorly.  A remote/subacute process is favored. Given history of acute fall, recommend clinical correlation for point tenderness and pain in this region.   Original Report Authenticated By: Malachy Moan, M.D.   Ct Cervical Spine Wo Contrast  01/14/2013   *RADIOLOGY REPORT*  Clinical Data:  Weakness, sore throat.  Esophageal cancer  CT HEAD WITHOUT CONTRAST CT CERVICAL SPINE WITHOUT CONTRAST  Technique:  Multidetector CT imaging of the head and cervical spine was performed following the standard protocol without intravenous contrast.  Multiplanar CT image reconstructions of the cervical spine were also generated.  Comparison:  head CT 08/30/2011  CT HEAD  Findings: No acute intracranial hemorrhage.  No focal mass lesion. No CT evidence of acute infarction.   No midline shift or mass effect.  No hydrocephalus.  Basilar cisterns are patent.  Periventricular white matter hypodensities unchanged from prior. Stable atrophy.  Paranasal sinuses and mastoid air cells are clear.  Orbits are normal.  IMPRESSION:  1.  No acute intracranial findings.  2.  Stable atrophy and microvascular disease.  CT CERVICAL SPINE  Findings: No prevertebral soft tissue swelling.  Normal alignment of cervical vertebral bodies.  No loss of vertebral body height. Normal facet articulation.  Normal craniocervical junction.  No evidence epidural or paraspinal hematoma.  There is multilevel endplate osteophytosis.  Small lucent lesions within the cervical vertebral bodies are felt to relate to osteoporosis.  Limited view of the upper thorax demonstrates a large right pleural effusion.  IMPRESSION:  1.  No acute findings of the cervical spine.  2.  Multilevel disc osteophytic disease. 3.  Large right pleural effusion.   Original Report  Authenticated By: Genevive Bi, M.D.   Dg Hand Complete Left  01/14/2013   *RADIOLOGY REPORT*  Clinical Data: Larey Seat today with hand pain and bruising  LEFT HAND - COMPLETE 3+ VIEW  Comparison: None.  Findings: There is an acute angulated fracture of the distal aspect of the left fifth metacarpal with soft tissue swelling.  No other acute abnormality is seen.  IMPRESSION: Acute angulated fracture of the distal left fifth metacarpal.   Original Report Authenticated By: Dwyane Dee, M.D.    Scheduled Meds: . aspirin EC  81 mg Oral Daily  . atorvastatin  40 mg Oral q1800  . ezetimibe  10 mg Oral Daily  . famotidine  20 mg Oral BID  . heparin  5,000 Units Subcutaneous Q8H  . levothyroxine  100 mcg Oral QAC breakfast  . lisinopril  40 mg Oral Daily  . metoprolol succinate  100 mg Oral Daily   Continuous Infusions: . 0.9 % NaCl with KCl 20 mEq / L 75 mL/hr at 01/14/13 2352    Principal Problem:  Pleural effusion on right Active Problems:   HTN (hypertension)   Hyperlipidemia   Osteoarthritis   GERD (gastroesophageal reflux disease)   CAD (coronary artery disease)   Hypothyroid   Anemia   Metacarpal bone fracture   Fall    Time spent: 30 minutes    [facility] M  [facility]  If 7PM-7AM, please contact night-coverage at [email or web address], password [facility] 01/15/2013, 12:55 PM  LOS: 1 day   Attending note:  Patient seen and independently examined. She's been admitted with shortness of breath and right-sided pleural effusion. She underwent thoracentesis today which yielded 1700 cc of fluid. Followup chest x-rays is significantly improved. Initial fluid analysis indicates an exudative process. She does not have any evidence of pneumonia. Fluid was sent for cytology. Echocardiogram and BNP is currently pending. She is severely deconditioned and physical therapy and recommended skilled facility placement. We'll continue to follow.  Shanterria Franta

## 2013-01-15 NOTE — Plan of Care (Signed)
Problem: Phase II Progression Outcomes Goal: Progress activity as tolerated unless otherwise ordered Outcome: Progressing oob to bsc with assist

## 2013-01-15 NOTE — Evaluation (Signed)
Physical Therapy Evaluation Patient Details Name: Allison Collier MRN: [id number] DOB: 1921/03/03 Today's Date: 01/15/2013 Time: 4098-1191 PT Time Calculation (min): 37 min  PT Assessment / Plan / Recommendation Clinical Impression  Pt was seen for evaluation and found to have significant balance deficit.  She is alert and oriented, very pleasant.  She lives with her daughter who is with her most of the time.  They live in a small, 2 room apartment where there is little room to move about. She has a cane and a walker but often just "furniture walks"...daughter reports several falls recently. Currently pt has a new fx in the L hand for which she is wearing a short arm splint.  Our only option for gait other than a cane is to use a L platform walker which is rather awkward and bulky to use.  I worry that this device will actually throw the pt off balance,  however a cane doesn't afford enough support, in my opinion.  She definately needs formal gait and balance training which would best be done in SNF.                  PT Assessment  Patient needs continued PT services    Follow Up Recommendations  SNF    Does the patient have the potential to tolerate intense rehabilitation      Barriers to Discharge None      Equipment Recommendations   (may need L platform for walker)    Recommendations for Other Services     Frequency Min 3X/week    Precautions / Restrictions Precautions Precautions: Fall Restrictions Weight Bearing Restrictions: No   Pertinent Vitals/Pain       Mobility  Bed Mobility Bed Mobility: Supine to Sit;Sit to Supine Supine to Sit: 4: Min guard;HOB flat Sit to Supine: 4: Min guard;HOB flat Transfers Transfers: Sit to Stand;Stand to Sit Sit to Stand: 4: Min guard;With upper extremity assist;From bed Stand to Sit: 4: Min guard;With upper extremity assist;To bed Ambulation/Gait Ambulation/Gait Assistance: 4: Min assist Ambulation Distance (Feet): 80  Feet Assistive device: Straight cane (platform L walker) Ambulation/Gait Assistance Details: L wrist and hand is in a splint which precludes weight bearing on L hand (has fx L hand)...she needs an assistive device;  a cane may not be enough support and the platform walker may be too cumbersome for her to effectively use Gait Pattern: Trunk flexed;Decreased hip/knee flexion - left;Decreased hip/knee flexion - right;Decreased stride length Stairs: No Wheelchair Mobility Wheelchair Mobility: No    Exercises     PT Diagnosis: Difficulty walking;Abnormality of gait  PT Problem List: Decreased balance;Decreased knowledge of use of DME;Cardiopulmonary status limiting activity PT Treatment Interventions: Gait training;Balance training   PT Goals Acute Rehab PT Goals PT Goal Formulation: With patient/family Time For Goal Achievement: 01/22/13 Potential to Achieve Goals: Good Pt will go Sit to Stand: with modified independence;with upper extremity assist PT Goal: Sit to Stand - Progress: Goal set today Pt will Ambulate: 51 - 150 feet;with least restrictive assistive device PT Goal: Ambulate - Progress: Goal set today  Visit Information  Last PT Received On: 01/15/13    Subjective Data  Subjective: pt voices no c/o...daughter states that pt uses her walker only some of the time at home Patient Stated Goal: none stated   Prior Functioning  Home Living Lives With: Daughter Available Help at Discharge: Family Type of Home: Apartment Home Access: Level entry Home Layout: One level Bathroom Shower/Tub: Nurse, adult  Toilet: Standard Home Adaptive Equipment: Walker - rolling;Straight cane;Shower chair with back Prior Function Level of Independence: Independent with assistive device(s) Able to Take Stairs?: No Driving: No Vocation: Retired Musician: No difficulties    Copywriter, advertising Arousal/Alertness: Awake/alert Behavior During Therapy: WFL for  tasks assessed/performed Overall Cognitive Status: Within Functional Limits for tasks assessed    Extremity/Trunk Assessment Right Lower Extremity Assessment RLE ROM/Strength/Tone: Within functional levels RLE Sensation: WFL - Light Touch RLE Coordination: WFL - gross motor Left Lower Extremity Assessment LLE ROM/Strength/Tone: Within functional levels LLE Sensation: WFL - Light Touch LLE Coordination: WFL - gross motor Trunk Assessment Trunk Assessment: Kyphotic   Balance Balance Balance Assessed: Yes Dynamic Standing Balance Dynamic Standing - Balance Support: No upper extremity supported Dynamic Standing - Level of Assistance: 3: Mod assist  End of Session PT - End of Session Equipment Utilized During Treatment: Gait belt Activity Tolerance: Patient tolerated treatment well Patient left: in bed;with call bell/phone within reach;with bed alarm set  GP     Konrad Penta 01/15/2013, 3:23 PM

## 2013-01-15 NOTE — Procedures (Signed)
PreOperative Dx: RIGHT pleural effusion Postoperative Dx: RIGHT pleural effusion Procedure:   US guided RIGHT thoracentesis Radiologist:  Tyron Russell Anesthesia:  5 ml of 1% lidocaine Specimen:  1700 ml of dark amber colored fluid EBL:   None Complications: None

## 2013-01-15 NOTE — Progress Notes (Signed)
UR complete.  Elijah Michaelis RN, MSN 

## 2013-01-15 NOTE — Clinical Social Work Psychosocial (Signed)
Clinical Social Work Department BRIEF PSYCHOSOCIAL ASSESSMENT 01/15/2013  Patient:  Allison Collier, Allison Collier     Account Number:  [id number]     Admit date:  01/14/2013  Clinical Social Worker:  Nancie Neas  Date/Time:  01/15/2013 03:35 PM  Referred by:  CSW  Date Referred:  01/15/2013 Referred for  SNF Placement   Other Referral:   Interview type:  Family Other interview type:   Olegario Messier and Eliot Ford- children    PSYCHOSOCIAL DATA Living Status:  WITH ADULT CHILDREN Admitted from facility:   Level of care:   Primary support name:  Olegario Messier Primary support relationship to patient:  CHILD, ADULT Degree of support available:   supportive    CURRENT CONCERNS Current Concerns  Post-Acute Placement   Other Concerns:    SOCIAL WORK ASSESSMENT / PLAN CSW met with pt's son and daughter at bedside. Pt asleep during assessment. Olegario Messier, pt's daughter, lives with pt. There are five children listed on chart. Olegario Messier states pt was in bathroom yesterday when she fell and she was unable to get up with assistance. EMS brought pt to ED. Several falls in the past reported at home. Pt generally is a furniture walker, but does have a cane and walker for use, particularly outside the home. Pt is generally independent in bathing and feeding. Olegario Messier indicates she has been thinking some about placement for her mother as she has started to require more care and it scares her when she falls. Pt evaluated by PT and recommendation is for SNF. CSW discussed placement process with children. Pt has been to SNF before several years ago after a hip fracture. They are agreeable for CSW to initiate bed search in [facility] while they discuss plan with other family members. Aware of Medicare coverage/criteria. SNF list provided.   Assessment/plan status:  Psychosocial Support/Ongoing Assessment of Needs Other assessment/ plan:   Information/referral to community resources:   SNF list    PATIENT'S/FAMILY'S RESPONSE TO  PLAN OF CARE: Pt unable to discuss plan of care as she is sleeping. Family will discuss further and notify CSW of final decision tomorrow, but are agreeable to bed search at this time.       Derenda Fennel, [place] [phone]

## 2013-01-15 NOTE — Progress Notes (Signed)
Lidocaine 1%             5mL injected                        pleural fluid removed

## 2013-01-15 NOTE — Progress Notes (Signed)
CRITICAL VALUE ALERT  Critical value received:Temp 101.5  Date of notification: 01/15/13  Time of notification:1900  Critical value read back:yes  Nurse who received alert: Norva Karvonen RN  MD notified (1st page): Orvan Falconer  Time of first page: 1900  MD notified (2nd page):  Time of second page:  Responding MD: Orvan Falconer  Time MD responded: 1900

## 2013-01-15 NOTE — Clinical Social Work Placement (Signed)
Clinical Social Work Department CLINICAL SOCIAL WORK PLACEMENT NOTE 01/15/2013  Patient:  Allison Collier, Allison Collier  Account Number:  [id number] Admit date:  01/14/2013  Clinical Social Worker:  Derenda Fennel, LCSW  Date/time:  01/15/2013 03:30 PM  Clinical Social Work is seeking post-discharge placement for this patient at the following level of care:   SKILLED NURSING   (*CSW will update this form in Epic as items are completed)   01/15/2013  Patient/family provided with [facility] Department of Clinical Social Work's list of facilities offering this level of care within the geographic area requested by the patient (or if unable, by the patient's family).  01/15/2013  Patient/family informed of their freedom to choose among providers that offer the needed level of care, that participate in Medicare, Medicaid or managed care program needed by the patient, have an available bed and are willing to accept the patient.  01/15/2013  Patient/family informed of [facility]' ownership interest in [facility], as well as of the fact that they are under no obligation to receive care at this facility.  PASARR submitted to EDS on  PASARR number received from EDS on   FL2 transmitted to all facilities in geographic area requested by pt/family on  01/15/2013 FL2 transmitted to all facilities within larger geographic area on   Patient informed that his/her managed care company has contracts with or will negotiate with  certain facilities, including the following:     Patient/family informed of bed offers received:   Patient chooses bed at  Physician recommends and patient chooses bed at    Patient to be transferred to  on   Patient to be transferred to facility by   The following physician request were entered in Epic:   Additional Comments: Pt has existing pasarr.  Derenda Fennel, [place] [phone]

## 2013-01-16 ENCOUNTER — Inpatient Hospital Stay (HOSPITAL_COMMUNITY): Payer: Medicare Other

## 2013-01-16 DIAGNOSIS — J189 Pneumonia, unspecified organism: Secondary | ICD-10-CM | POA: Diagnosis present

## 2013-01-16 DIAGNOSIS — I509 Heart failure, unspecified: Principal | ICD-10-CM

## 2013-01-16 DIAGNOSIS — I5022 Chronic systolic (congestive) heart failure: Secondary | ICD-10-CM | POA: Diagnosis present

## 2013-01-16 DIAGNOSIS — J948 Other specified pleural conditions: Secondary | ICD-10-CM | POA: Diagnosis not present

## 2013-01-16 LAB — CBC
HCT: 34.1 % — ABNORMAL LOW (ref 36.0–46.0)
MCH: 33.4 pg (ref 26.0–34.0)
MCV: 100.9 fL — ABNORMAL HIGH (ref 78.0–100.0)
Platelets: 100 10*3/uL — ABNORMAL LOW (ref 150–400)
RBC: 3.38 MIL/uL — ABNORMAL LOW (ref 3.87–5.11)
WBC: 3.5 10*3/uL — ABNORMAL LOW (ref 4.0–10.5)

## 2013-01-16 LAB — BASIC METABOLIC PANEL
BUN: 15 mg/dL (ref 6–23)
CO2: 28 mEq/L (ref 19–32)
Calcium: 8.5 mg/dL (ref 8.4–10.5)
Chloride: 103 mEq/L (ref 96–112)
Creatinine, Ser: 0.79 mg/dL (ref 0.50–1.10)

## 2013-01-16 MED ORDER — HYDROMORPHONE HCL PF 1 MG/ML IJ SOLN
1.0000 mg | Freq: Once | INTRAMUSCULAR | Status: AC
  Administered 2013-01-16: 1 mg via INTRAVENOUS
  Filled 2013-01-16: qty 1

## 2013-01-16 MED ORDER — IOHEXOL 300 MG/ML  SOLN
80.0000 mL | Freq: Once | INTRAMUSCULAR | Status: AC | PRN
  Administered 2013-01-16: 80 mL via INTRAVENOUS

## 2013-01-16 MED ORDER — LIDOCAINE HCL (PF) 1 % IJ SOLN
INTRAMUSCULAR | Status: AC
  Administered 2013-01-16: 15 mL
  Filled 2013-01-16: qty 5

## 2013-01-16 MED ORDER — HYDROCODONE-ACETAMINOPHEN 5-325 MG PO TABS
1.0000 | ORAL_TABLET | ORAL | Status: DC | PRN
  Administered 2013-01-16 – 2013-01-18 (×2): 1 via ORAL
  Filled 2013-01-16 (×2): qty 1

## 2013-01-16 MED ORDER — ENOXAPARIN SODIUM 40 MG/0.4ML ~~LOC~~ SOLN
40.0000 mg | SUBCUTANEOUS | Status: DC
  Administered 2013-01-16 – 2013-01-21 (×6): 40 mg via SUBCUTANEOUS
  Filled 2013-01-16 (×6): qty 0.4

## 2013-01-16 MED ORDER — LEVOTHYROXINE SODIUM 112 MCG PO TABS
112.0000 ug | ORAL_TABLET | Freq: Every day | ORAL | Status: DC
  Administered 2013-01-17 – 2013-01-21 (×4): 112 ug via ORAL
  Filled 2013-01-16 (×7): qty 1

## 2013-01-16 MED ORDER — LIDOCAINE HCL (PF) 1 % IJ SOLN: 15.0000 mL | Freq: Once | INTRAMUSCULAR | Status: AC

## 2013-01-16 MED ORDER — LIDOCAINE HCL (PF) 1 % IJ SOLN
INTRAMUSCULAR | Status: AC
  Filled 2013-01-16: qty 15

## 2013-01-16 MED ORDER — LEVOFLOXACIN IN D5W 750 MG/150ML IV SOLN
750.0000 mg | INTRAVENOUS | Status: DC
  Administered 2013-01-16 – 2013-01-18 (×3): 750 mg via INTRAVENOUS
  Filled 2013-01-16 (×3): qty 150

## 2013-01-16 NOTE — Procedures (Signed)
Chest Tube Insertion Procedure Note  Indications:  Clinically significant Pneumothorax and Effusion  Pre-operative Diagnosis: Pneumothorax and Effusion  Post-operative Diagnosis: Pneumothorax and Effusion  Procedure Details  Informed consent was obtained for the procedure, including sedation.  Risks of lung perforation, hemorrhage, arrhythmia, and adverse drug reaction were discussed.   After sterile skin prep, using standard technique, a 28 French tube was placed in the right lateral ~4-5th rib space.  Findings: 500 ml of serosanguinous fluid obtained  Estimated Blood Loss:  Minimal         Specimens:  None              Complications:  None; patient tolerated the procedure well.         Disposition: Inpatient         Condition: stable  Attending Attestation: I performed the procedure.

## 2013-01-16 NOTE — Clinical Social Work Note (Signed)
CSW met with pt's daughter, Olegario Messier again today at bedside. She reports pt has been up to [facility] and seemed to do okay. Family feel they can continue to manage pt at home. She had questions regarding home health which CM will address. Pt had SNF bed offers if family change their mind. MD aware. CSW signing off but can be reconsulted if needed.  Derenda Fennel, [place] [phone]

## 2013-01-16 NOTE — Progress Notes (Signed)
PT Cancellation Note  Patient Details Name: Allison Collier MRN: [id number] DOB: 06-Aug-1921   Cancelled Treatment:    Reason Eval/Treat Not Completed: Medical issues which prohibited therapy Pt has been found to have a right pneumothorax.  We will hold PT until notified otherwise.  Myrlene Broker L 01/16/2013, 1:40 PM

## 2013-01-16 NOTE — Care Management Note (Signed)
    Page 1 of 2   01/21/2013     10:47:45 AM   CARE MANAGEMENT NOTE 01/21/2013  Patient:  Allison Collier, Allison Collier   Account Number:  [id number]  Date Initiated:  01/16/2013  Documentation initiated by:  Sharrie Rothman  Subjective/Objective Assessment:   Pt admitted from home with falls and weakness. Pt lives with her daughter who provides 24 hour care for pt along with other siblings. Pt has a w/c, walker, and shower chair. Pts daughter refuses SNF and really does not want HH.     Action/Plan:   CM will reassess HH needs once other family arrives and will arrange [facility] if they desire.   Anticipated DC Date:  01/17/2013   Anticipated DC Plan:  HOME W HOME HEALTH SERVICES  In-house referral  Clinical Social Worker      DC Planning Services  CM consult      Choice offered to / List presented to:             Status of service:  Completed, signed off Medicare Important Message given?  YES (If response is "NO", the following Medicare IM given date fields will be blank) Date Medicare IM given:  01/21/2013 Date Additional Medicare IM given:    Discharge Disposition:  SKILLED NURSING FACILITY  Per UR Regulation:    If discussed at Long Length of Stay Meetings, dates discussed:   01/20/2013    Comments:  01/21/13 1045 Arlyss Queen, RN BSN CM Pt discharged to [facility]. CSW to arrange discharge to facility.  01/20/13 1550 Arlyss Queen, RN BSN CM CHest tube removed today. Pt will be stable for discharge 01/21/13. After much discussion with pt and pts daughter, they have decided to discharge to [facility] for rehab. CSW was apart of the discussion and is aware of discharge plans.  01/16/13 1238 Arlyss Queen, RN BSN CM

## 2013-01-16 NOTE — Consult Note (Signed)
Consult requested by: Dr. Kerry Hough Consult requested for pleural effusion:  HPI: This is an 79 year old who came to the emergency room after a fall. During her evaluation she was noted to have a large left pleural effusion. She says she's been coughing some and may be been somewhat short of breath. She denies any chest pain. She has not had any hemoptysis. She has no other lung symptoms.  Past Medical History  Diagnosis Date  . GERD (gastroesophageal reflux disease)   . Thyroid disease   . Hypertension   . Hyperlipidemia   . Osteoporosis   . Cancer      No family history on file.   History   Social History  . Marital Status: Widowed    Spouse Name: N/A    Number of Children: N/A  . Years of Education: N/A   Social History Main Topics  . Smoking status: Never Smoker   . Smokeless tobacco: Current User    Types: Snuff  . Alcohol Use: No  . Drug Use: No  . Sexually Active: None   Other Topics Concern  . None   Social History Narrative  . None     ROS: She denies fever chills nausea vomiting chest discomfort or dysuria    Objective: Vital signs in last 24 hours: Temp:  [97.3 F (36.3 C)-101.6 F (38.7 C)] 97.5 F (36.4 C) (05/30 0542) Pulse Rate:  [65-86] 74 (05/30 0542) Resp:  [20] 20 (05/30 0542) BP: (125-158)/(55-67) 158/55 mmHg (05/30 0542) SpO2:  [93 %-96 %] 95 % (05/30 0542) Weight:  [60.4 kg (133 lb 2.5 oz)] 60.4 kg (133 lb 2.5 oz) (05/30 0500) Weight change: 1.705 kg (3 lb 12.1 oz) Last BM Date: 01/14/13  Intake/Output from previous day: 05/29 0701 - 05/30 0700 In: 2395 [P.O.:360; I.V.:2035] Out: 1750 [Urine:1750]  PHYSICAL EXAM She is awake and alert. She is very thin. Her pupils are reactive. Her nose and throat are clear. She is edentulous.   her chest and is clear.Marland Kitchen Her heart is regular. Her abdomen is soft. She does not have any edema. Central nervous system examination is grossly intact r.  Lab Results: Basic Metabolic Panel:  Recent  Labs  01/15/13 0526 01/16/13 0800  NA 137 137  K 5.0 4.5  CL 103 103  CO2 28 28  GLUCOSE 83 89  BUN 18 15  CREATININE 0.91 0.79  CALCIUM 9.1 8.5   Liver Function Tests:  Recent Labs  01/15/13 0530  AST 15  ALT 10  ALKPHOS 74  BILITOT 0.5  PROT 6.0  ALBUMIN 3.0*   No results found for this basename: LIPASE, AMYLASE,  in the last 72 hours No results found for this basename: AMMONIA,  in the last 72 hours CBC:  Recent Labs  01/14/13 1611 01/15/13 0526 01/16/13 0800  WBC 4.9 4.8 3.5*  NEUTROABS 3.8  --   --   HGB 12.7 11.4* 11.3*  HCT 38.9 34.8* 34.1*  MCV 100.5* 100.3* 100.9*  PLT 137* 130* 100*   Cardiac Enzymes: No results found for this basename: CKTOTAL, CKMB, CKMBINDEX, TROPONINI,  in the last 72 hours BNP:  Recent Labs  01/15/13 0526  PROBNP 1699.0*   D-Dimer: No results found for this basename: DDIMER,  in the last 72 hours CBG: No results found for this basename: GLUCAP,  in the last 72 hours Hemoglobin A1C: No results found for this basename: HGBA1C,  in the last 72 hours Fasting Lipid Panel: No results found for this basename:  CHOL, HDL, LDLCALC, TRIG, CHOLHDL, LDLDIRECT,  in the last 72 hours Thyroid Function Tests:  Recent Labs  01/14/13 2320  TSH 10.155*   Anemia Panel: No results found for this basename: VITAMINB12, FOLATE, FERRITIN, TIBC, IRON, RETICCTPCT,  in the last 72 hours Coagulation: No results found for this basename: LABPROT, INR,  in the last 72 hours Urine Drug Screen: Drugs of Abuse  No results found for this basename: labopia, cocainscrnur, labbenz, amphetmu, thcu, labbarb    Alcohol Level: No results found for this basename: ETH,  in the last 72 hours Urinalysis:  Recent Labs  01/14/13 1710  COLORURINE YELLOW  LABSPEC 1.020  PHURINE 6.0  GLUCOSEU NEGATIVE  HGBUR TRACE*  BILIRUBINUR NEGATIVE  KETONESUR NEGATIVE  PROTEINUR NEGATIVE  UROBILINOGEN 1.0  NITRITE NEGATIVE  LEUKOCYTESUR NEGATIVE   Misc.  Labs:   ABGS: No results found for this basename: PHART, PCO2, PO2ART, TCO2, HCO3,  in the last 72 hours   MICROBIOLOGY: Recent Results (from the past 240 hour(s))  MRSA PCR SCREENING     Status: Abnormal   Collection Time    01/15/13  8:44 AM      Result Value Range Status   MRSA by PCR POSITIVE (*) NEGATIVE Final   Comment:            The GeneXpert MRSA Assay (FDA     approved for NASAL specimens     only), is one component of a     comprehensive MRSA colonization     surveillance program. It is not     intended to diagnose MRSA     infection nor to guide or     monitor treatment for     MRSA infections.     RESULT CALLED TO, READ BACK BY AND VERIFIED WITH:     BUCKNER,J. AT 1548 ON 01/15/2013 BY BAUGHAM,M.  BODY FLUID CULTURE     Status: None   Collection Time    01/15/13 11:13 AM      Result Value Range Status   Specimen Description     Final   Value: FLUID RIGHT PLEURAL EFFUSION COLLECTED BY DOCTOR BOLES   Special Requests NONE   Final   Gram Stain     Final   Value: NO WBC SEEN     NO ORGANISMS SEEN   Culture PENDING   Incomplete   Report Status PENDING   Incomplete    Studies/Results: Dg Chest 1 View  01/15/2013   *RADIOLOGY REPORT*  Clinical Data: Right pleural effusion post thoracentesis  CHEST - 1 VIEW  Comparison: Chest CT 01/14/2013  Findings: Normal heart size and pulmonary vascularity. Tortuous aorta with atherosclerotic calcification. Marked decrease in right pleural effusion with minimal residual pleural effusion noted at right costophrenic angle. No pneumothorax. Minimal residual right basilar atelectasis. Lungs otherwise clear with question underlying emphysematous changes. Bones demineralized. No pneumothorax.  IMPRESSION: Marked decrease in right pleural effusion and basilar atelectasis post thoracentesis. No pneumothorax.   Original Report Authenticated By: Ulyses Southward, M.D.   Dg Hip Bilateral W/pelvis  01/14/2013   *RADIOLOGY REPORT*  Clinical Data:  Larey Seat today, history of prior left hip fracture and fixation  BILATERAL HIP WITH PELVIS - 4+ VIEW  Comparison: Left hip films of 03/06/2012  Findings: An intramedullary pin is noted with a screw through the prior left intertrochanteric femoral fracture for fixation.  No acute complication is seen and no evidence of prosthetic motion is noted.  IMPRESSION:   Stable appearance of prior fixation  of left intertrochanteric femoral fracture.   Original Report Authenticated By: Dwyane Dee, M.D.   Ct Head Wo Contrast  01/14/2013   *RADIOLOGY REPORT*  Clinical Data:  Weakness, sore throat.  Esophageal cancer  CT HEAD WITHOUT CONTRAST CT CERVICAL SPINE WITHOUT CONTRAST  Technique:  Multidetector CT imaging of the head and cervical spine was performed following the standard protocol without intravenous contrast.  Multiplanar CT image reconstructions of the cervical spine were also generated.  Comparison:  head CT 08/30/2011  CT HEAD  Findings: No acute intracranial hemorrhage.  No focal mass lesion. No CT evidence of acute infarction.   No midline shift or mass effect.  No hydrocephalus.  Basilar cisterns are patent.  Periventricular white matter hypodensities unchanged from prior. Stable atrophy.  Paranasal sinuses and mastoid air cells are clear.  Orbits are normal.  IMPRESSION:  1.  No acute intracranial findings.  2.  Stable atrophy and microvascular disease.  CT CERVICAL SPINE  Findings: No prevertebral soft tissue swelling.  Normal alignment of cervical vertebral bodies.  No loss of vertebral body height. Normal facet articulation.  Normal craniocervical junction.  No evidence epidural or paraspinal hematoma.  There is multilevel endplate osteophytosis.  Small lucent lesions within the cervical vertebral bodies are felt to relate to osteoporosis.  Limited view of the upper thorax demonstrates a large right pleural effusion.  IMPRESSION:  1.  No acute findings of the cervical spine.  2.  Multilevel disc osteophytic  disease. 3.  Large right pleural effusion.   Original Report Authenticated By: Genevive Bi, M.D.   Ct Chest W Contrast  01/14/2013   *RADIOLOGY REPORT*  Clinical Data: Fall, history of esophageal cancer  CT CHEST WITH CONTRAST  Technique:  Multidetector CT imaging of the chest was performed following the standard protocol during bolus administration of intravenous contrast.  Contrast: 80mL OMNIPAQUE IOHEXOL 300 MG/ML  SOLN  Comparison: With the prior chest x-ray 12/16/2011  Findings:  Mediastinum: Unremarkable CT appearance of the thyroid gland.  No suspicious mediastinal or hilar adenopathy.  No soft tissue mediastinal mass.  The thoracic esophagus is unremarkable.  Heart/Vascular: Ectatic bordering on aneurysmally dilated ascending thoracic aorta.  The ascending thoracic aorta measures 4 cm in diameter.  The transverse aorta measures approximately 3 cm in diameter and the descending thoracic aorta 2.4 cm in diameter. There are scattered atherosclerotic vascular calcifications without significant stenosis.  The great vessels are tortuous proximally. Calcifications are noted throughout the coronary arteries. Suggestion of thickening of the aortic valve.  Mild left ventricular dilatation.  Otherwise, the heart is within normal limits for size.  No significant pericardial effusion.  Lungs/Pleura: Large layering right pleural effusion there is associated passive atelectasis of the right lower lobe. Parenchymal scarring in the posterior aspect of the left upper lobe.  Mild diffuse bronchial wall thickening.  Upper Abdomen: Large calcified gallstone within the incompletely imaged gallbladder.  Bilateral renal cysts.  Diffuse calcified splenic artery and origins of the celiac and mesenteric arteries.  Bones: No acute fracture or aggressive appearing lytic or blastic osseous lesion.  Age indeterminate T7 compression fracture with approximately 50% height loss anteriorly.  No significant posterior bony retropulsion.   IMPRESSION:  1.  Large right layering pleural effusion with associated right lower lobe atelectasis. 2.  Ectatic bordering on aneurysmally dilated ascending thoracic aorta.  Maximal dimension is 4.0 cm 3.  Atherosclerosis including coronary artery disease 4.  Diffuse bilateral bronchial wall thickening suggests underlying chronic bronchitis/COPD 5.  Age indeterminate T7  compression fracture with approximately 50% height loss anteriorly.  A remote/subacute process is favored. Given history of acute fall, recommend clinical correlation for point tenderness and pain in this region.   Original Report Authenticated By: Malachy Moan, M.D.   Ct Cervical Spine Wo Contrast  01/14/2013   *RADIOLOGY REPORT*  Clinical Data:  Weakness, sore throat.  Esophageal cancer  CT HEAD WITHOUT CONTRAST CT CERVICAL SPINE WITHOUT CONTRAST  Technique:  Multidetector CT imaging of the head and cervical spine was performed following the standard protocol without intravenous contrast.  Multiplanar CT image reconstructions of the cervical spine were also generated.  Comparison:  head CT 08/30/2011  CT HEAD  Findings: No acute intracranial hemorrhage.  No focal mass lesion. No CT evidence of acute infarction.   No midline shift or mass effect.  No hydrocephalus.  Basilar cisterns are patent.  Periventricular white matter hypodensities unchanged from prior. Stable atrophy.  Paranasal sinuses and mastoid air cells are clear.  Orbits are normal.  IMPRESSION:  1.  No acute intracranial findings.  2.  Stable atrophy and microvascular disease.  CT CERVICAL SPINE  Findings: No prevertebral soft tissue swelling.  Normal alignment of cervical vertebral bodies.  No loss of vertebral body height. Normal facet articulation.  Normal craniocervical junction.  No evidence epidural or paraspinal hematoma.  There is multilevel endplate osteophytosis.  Small lucent lesions within the cervical vertebral bodies are felt to relate to osteoporosis.  Limited  view of the upper thorax demonstrates a large right pleural effusion.  IMPRESSION:  1.  No acute findings of the cervical spine.  2.  Multilevel disc osteophytic disease. 3.  Large right pleural effusion.   Original Report Authenticated By: Genevive Bi, M.D.   Dg Hand Complete Left  01/14/2013   *RADIOLOGY REPORT*  Clinical Data: Larey Seat today with hand pain and bruising  LEFT HAND - COMPLETE 3+ VIEW  Comparison: None.  Findings: There is an acute angulated fracture of the distal aspect of the left fifth metacarpal with soft tissue swelling.  No other acute abnormality is seen.  IMPRESSION: Acute angulated fracture of the distal left fifth metacarpal.   Original Report Authenticated By: Dwyane Dee, M.D.   US Thoracentesis Asp Pleural Space W/img Guide  01/15/2013   *RADIOLOGY REPORT*  CLINICAL DATA: Right pleural effusion of unknown etiology, shortness of breath  ULTRASOUND GUIDED RIGHT THORACENTESIS:  Technique: After explanation of procedure and risks, written informed consent for thoracentesis obtained. Time out protocol followed. Right pleural effusion localized by ultrasound. Site at posterior rightchest prepped and draped in usual sterile fashion. Skin and chest wall anesthetized with 5 ml of 1% lidocaine. Standard 8-French thoracentesis catheter placed into right pleural space. 1700  ml of dark and there are colored fluid aspirated by syringe pump. Procedure tolerated very well by patient without immediate complication. Post procedure chest radiograph obtained.  IMPRESSION: Ultrasound-guided right thoracentesis with removal of 1700 ml of pleural fluid. Fluid sent to laboratory for requested analysis.   Original Report Authenticated By: Ulyses Southward, M.D.    Medications:  Scheduled: . aspirin EC  81 mg Oral Daily  . atorvastatin  40 mg Oral q1800  . Chlorhexidine Gluconate Cloth  6 each Topical Q0600  . enoxaparin (LOVENOX) injection  40 mg Subcutaneous Q24H  . ezetimibe  10 mg Oral Daily  .  famotidine  20 mg Oral BID  . levothyroxine  100 mcg Oral QAC breakfast  . lisinopril  40 mg Oral Daily  . metoprolol succinate  100 mg Oral Daily  . mupirocin ointment  1 application Nasal BID   Continuous: . 0.9 % NaCl with KCl 20 mEq / L 1,000 mL (01/16/13 0400)   BJY:[id number], bisacodyl, nitroGLYCERIN, ondansetron (ZOFRAN) IV, polyethylene glycol, traZODone  Assesment: She had a large right pleural effusion and thoracentesis with removal of about 1700 cc. This looks like it is exudative in nature. Cytology etc. is pending. Since the fluid has been removed I think we may get some information from CT and I have ordered that Principal Problem:   Pleural effusion on right Active Problems:   HTN (hypertension)   Hyperlipidemia   Osteoarthritis   GERD (gastroesophageal reflux disease)   CAD (coronary artery disease)   Hypothyroid   Anemia   Metacarpal bone fracture   Fall   Fx metacarpal    Plan:for ct chest    LOS: 2 days   Alyscia Carmon L 01/16/2013, 8:52 AM

## 2013-01-16 NOTE — Progress Notes (Signed)
[facility] PROGRESS NOTE  Allison Collier ZOX:[id number] DOB: Nov 09, 1920 DOA: 01/14/2013 PCP: Rudi Heap, MD  Assessment/Plan: Pleural effusion on right  Possible late manifestation of a chronic condition : s/p thoracentesis that yielded 1700cc fluid. Chest xray improved with no suggestion infiltrate.  Preliminary report indicates exudative process. Fluid anyalysis yields normal protien, LD 88, glucose 111. Cytology pending. May be more related to HF process. Pro BNP 1699. Echo yields EF 30-35%.  Pulmonary consult appreciated. CT pending.   HTN: improved control. May be related to pain left hand. Continue home meds and monitor.   Fx left fifth metacarpal: splint in dry and intact. Fingers warm to touch.   . Hypothyroid: TSH 10.1. Will check free T4 and T3. Will increase synthroid. Will need OP follow up.   . Hyperlipidemia : continue statin   . Osteoarthritis and gait instability :  PT evaluation recommend SNF.  Recurrent falls likely due to gait instability   . GERD (gastroesophageal reflux disease): stable at baseline.    Code Status: full Family Communication: ) Disposition Plan: SNF   Consultants: pulmonary Procedures:  Thoracentesis 01/15/13  Antibiotics: none HPI/Subjective: Awake denies pain NAD  Objective: Filed Vitals:   01/15/13 2136 01/16/13 0201 01/16/13 0500 01/16/13 0542  BP: 125/60 152/63  158/55  Pulse: 78 65  74  Temp: 98.2 F (36.8 C) 97.3 F (36.3 C)  97.5 F (36.4 C)  TempSrc: Oral Oral  Oral  Resp: 20 20  20   Height:      Weight:   60.4 kg (133 lb 2.5 oz)   SpO2: 93% 96%  95%    Intake/Output Summary (Last 24 hours) at 01/16/13 0957 Last data filed at 01/16/13 0925  Gross per 24 hour  Intake   2395 ml  Output   1950 ml  Net    445 ml   Filed Weights   01/14/13 2232 01/16/13 0500  Weight: 58.695 kg (129 lb 6.4 oz) 60.4 kg (133 lb 2.5 oz)    Exam:   General:  Frail NAD  Cardiovascular: RRR No MGR No LE  edema  Respiratory: Normal effort. BS somewhat distant but clear  Abdomen: flat soft +BS non-tender to palpation  Musculoskeletal: no clubbing no cyanosis   Data Reviewed: Basic Metabolic Panel:  Recent Labs Lab 01/14/13 1611 01/15/13 0526 01/16/13 0800  NA 139 137 137  K 4.4 5.0 4.5  CL 102 103 103  CO2 29 28 28   GLUCOSE 111* 83 89  BUN 18 18 15   CREATININE 0.89 0.91 0.79  CALCIUM 9.8 9.1 8.5   Liver Function Tests:  Recent Labs Lab 01/15/13 0530  AST 15  ALT 10  ALKPHOS 74  BILITOT 0.5  PROT 6.0  ALBUMIN 3.0*   No results found for this basename: LIPASE, AMYLASE,  in the last 168 hours No results found for this basename: AMMONIA,  in the last 168 hours CBC:  Recent Labs Lab 01/14/13 1611 01/15/13 0526 01/16/13 0800  WBC 4.9 4.8 3.5*  NEUTROABS 3.8  --   --   HGB 12.7 11.4* 11.3*  HCT 38.9 34.8* 34.1*  MCV 100.5* 100.3* 100.9*  PLT 137* 130* 100*   Cardiac Enzymes: No results found for this basename: CKTOTAL, CKMB, CKMBINDEX, TROPONINI,  in the last 168 hours BNP (last 3 results)  Recent Labs  01/15/13 0526  PROBNP 1699.0*   CBG: No results found for this basename: GLUCAP,  in the last 168 hours  Recent Results (from the past 240 hour(s))  MRSA PCR SCREENING     Status: Abnormal   Collection Time    01/15/13  8:44 AM      Result Value Range Status   MRSA by PCR POSITIVE (*) NEGATIVE Final   Comment:            The GeneXpert MRSA Assay (FDA     approved for NASAL specimens     only), is one component of a     comprehensive MRSA colonization     surveillance program. It is not     intended to diagnose MRSA     infection nor to guide or     monitor treatment for     MRSA infections.     RESULT CALLED TO, READ BACK BY AND VERIFIED WITH:     BUCKNER,J. AT 1548 ON 01/15/2013 BY BAUGHAM,M.  BODY FLUID CULTURE     Status: None   Collection Time    01/15/13 11:13 AM      Result Value Range Status   Specimen Description     Final   Value:  FLUID RIGHT PLEURAL EFFUSION COLLECTED BY DOCTOR BOLES   Special Requests NONE   Final   Gram Stain     Final   Value: NO WBC SEEN     NO ORGANISMS SEEN   Culture PENDING   Incomplete   Report Status PENDING   Incomplete     Studies: Dg Chest 1 View  01/15/2013   *RADIOLOGY REPORT*  Clinical Data: Right pleural effusion post thoracentesis  CHEST - 1 VIEW  Comparison: Chest CT 01/14/2013  Findings: Normal heart size and pulmonary vascularity. Tortuous aorta with atherosclerotic calcification. Marked decrease in right pleural effusion with minimal residual pleural effusion noted at right costophrenic angle. No pneumothorax. Minimal residual right basilar atelectasis. Lungs otherwise clear with question underlying emphysematous changes. Bones demineralized. No pneumothorax.  IMPRESSION: Marked decrease in right pleural effusion and basilar atelectasis post thoracentesis. No pneumothorax.   Original Report Authenticated By: Ulyses Southward, M.D.   Dg Hip Bilateral W/pelvis  01/14/2013   *RADIOLOGY REPORT*  Clinical Data: Larey Seat today, history of prior left hip fracture and fixation  BILATERAL HIP WITH PELVIS - 4+ VIEW  Comparison: Left hip films of 03/06/2012  Findings: An intramedullary pin is noted with a screw through the prior left intertrochanteric femoral fracture for fixation.  No acute complication is seen and no evidence of prosthetic motion is noted.  IMPRESSION:   Stable appearance of prior fixation of left intertrochanteric femoral fracture.   Original Report Authenticated By: Dwyane Dee, M.D.   Ct Head Wo Contrast  01/14/2013   *RADIOLOGY REPORT*  Clinical Data:  Weakness, sore throat.  Esophageal cancer  CT HEAD WITHOUT CONTRAST CT CERVICAL SPINE WITHOUT CONTRAST  Technique:  Multidetector CT imaging of the head and cervical spine was performed following the standard protocol without intravenous contrast.  Multiplanar CT image reconstructions of the cervical spine were also generated.   Comparison:  head CT 08/30/2011  CT HEAD  Findings: No acute intracranial hemorrhage.  No focal mass lesion. No CT evidence of acute infarction.   No midline shift or mass effect.  No hydrocephalus.  Basilar cisterns are patent.  Periventricular white matter hypodensities unchanged from prior. Stable atrophy.  Paranasal sinuses and mastoid air cells are clear.  Orbits are normal.  IMPRESSION:  1.  No acute intracranial findings.  2.  Stable atrophy and microvascular disease.  CT CERVICAL SPINE  Findings: No prevertebral soft tissue swelling.  Normal alignment of cervical vertebral bodies.  No loss of vertebral body height. Normal facet articulation.  Normal craniocervical junction.  No evidence epidural or paraspinal hematoma.  There is multilevel endplate osteophytosis.  Small lucent lesions within the cervical vertebral bodies are felt to relate to osteoporosis.  Limited view of the upper thorax demonstrates a large right pleural effusion.  IMPRESSION:  1.  No acute findings of the cervical spine.  2.  Multilevel disc osteophytic disease. 3.  Large right pleural effusion.   Original Report Authenticated By: Genevive Bi, M.D.   Ct Chest W Contrast  01/14/2013   *RADIOLOGY REPORT*  Clinical Data: Fall, history of esophageal cancer  CT CHEST WITH CONTRAST  Technique:  Multidetector CT imaging of the chest was performed following the standard protocol during bolus administration of intravenous contrast.  Contrast: 80mL OMNIPAQUE IOHEXOL 300 MG/ML  SOLN  Comparison: With the prior chest x-ray 12/16/2011  Findings:  Mediastinum: Unremarkable CT appearance of the thyroid gland.  No suspicious mediastinal or hilar adenopathy.  No soft tissue mediastinal mass.  The thoracic esophagus is unremarkable.  Heart/Vascular: Ectatic bordering on aneurysmally dilated ascending thoracic aorta.  The ascending thoracic aorta measures 4 cm in diameter.  The transverse aorta measures approximately 3 cm in diameter and the  descending thoracic aorta 2.4 cm in diameter. There are scattered atherosclerotic vascular calcifications without significant stenosis.  The great vessels are tortuous proximally. Calcifications are noted throughout the coronary arteries. Suggestion of thickening of the aortic valve.  Mild left ventricular dilatation.  Otherwise, the heart is within normal limits for size.  No significant pericardial effusion.  Lungs/Pleura: Large layering right pleural effusion there is associated passive atelectasis of the right lower lobe. Parenchymal scarring in the posterior aspect of the left upper lobe.  Mild diffuse bronchial wall thickening.  Upper Abdomen: Large calcified gallstone within the incompletely imaged gallbladder.  Bilateral renal cysts.  Diffuse calcified splenic artery and origins of the celiac and mesenteric arteries.  Bones: No acute fracture or aggressive appearing lytic or blastic osseous lesion.  Age indeterminate T7 compression fracture with approximately 50% height loss anteriorly.  No significant posterior bony retropulsion.  IMPRESSION:  1.  Large right layering pleural effusion with associated right lower lobe atelectasis. 2.  Ectatic bordering on aneurysmally dilated ascending thoracic aorta.  Maximal dimension is 4.0 cm 3.  Atherosclerosis including coronary artery disease 4.  Diffuse bilateral bronchial wall thickening suggests underlying chronic bronchitis/COPD 5.  Age indeterminate T7 compression fracture with approximately 50% height loss anteriorly.  A remote/subacute process is favored. Given history of acute fall, recommend clinical correlation for point tenderness and pain in this region.   Original Report Authenticated By: Malachy Moan, M.D.   Ct Cervical Spine Wo Contrast  01/14/2013   *RADIOLOGY REPORT*  Clinical Data:  Weakness, sore throat.  Esophageal cancer  CT HEAD WITHOUT CONTRAST CT CERVICAL SPINE WITHOUT CONTRAST  Technique:  Multidetector CT imaging of the head and  cervical spine was performed following the standard protocol without intravenous contrast.  Multiplanar CT image reconstructions of the cervical spine were also generated.  Comparison:  head CT 08/30/2011  CT HEAD  Findings: No acute intracranial hemorrhage.  No focal mass lesion. No CT evidence of acute infarction.   No midline shift or mass effect.  No hydrocephalus.  Basilar cisterns are patent.  Periventricular white matter hypodensities unchanged from prior. Stable atrophy.  Paranasal sinuses and mastoid air cells are clear.  Orbits are normal.  IMPRESSION:  1.  No acute intracranial findings.  2.  Stable atrophy and microvascular disease.  CT CERVICAL SPINE  Findings: No prevertebral soft tissue swelling.  Normal alignment of cervical vertebral bodies.  No loss of vertebral body height. Normal facet articulation.  Normal craniocervical junction.  No evidence epidural or paraspinal hematoma.  There is multilevel endplate osteophytosis.  Small lucent lesions within the cervical vertebral bodies are felt to relate to osteoporosis.  Limited view of the upper thorax demonstrates a large right pleural effusion.  IMPRESSION:  1.  No acute findings of the cervical spine.  2.  Multilevel disc osteophytic disease. 3.  Large right pleural effusion.   Original Report Authenticated By: Genevive Bi, M.D.   Dg Hand Complete Left  01/14/2013   *RADIOLOGY REPORT*  Clinical Data: Larey Seat today with hand pain and bruising  LEFT HAND - COMPLETE 3+ VIEW  Comparison: None.  Findings: There is an acute angulated fracture of the distal aspect of the left fifth metacarpal with soft tissue swelling.  No other acute abnormality is seen.  IMPRESSION: Acute angulated fracture of the distal left fifth metacarpal.   Original Report Authenticated By: Dwyane Dee, M.D.   US Thoracentesis Asp Pleural Space W/img Guide  01/15/2013   *RADIOLOGY REPORT*  CLINICAL DATA: Right pleural effusion of unknown etiology, shortness of breath   ULTRASOUND GUIDED RIGHT THORACENTESIS:  Technique: After explanation of procedure and risks, written informed consent for thoracentesis obtained. Time out protocol followed. Right pleural effusion localized by ultrasound. Site at posterior rightchest prepped and draped in usual sterile fashion. Skin and chest wall anesthetized with 5 ml of 1% lidocaine. Standard 8-French thoracentesis catheter placed into right pleural space. 1700  ml of dark and there are colored fluid aspirated by syringe pump. Procedure tolerated very well by patient without immediate complication. Post procedure chest radiograph obtained.  IMPRESSION: Ultrasound-guided right thoracentesis with removal of 1700 ml of pleural fluid. Fluid sent to laboratory for requested analysis.   Original Report Authenticated By: Ulyses Southward, M.D.    Scheduled Meds: . aspirin EC  81 mg Oral Daily  . atorvastatin  40 mg Oral q1800  . Chlorhexidine Gluconate Cloth  6 each Topical Q0600  . enoxaparin (LOVENOX) injection  40 mg Subcutaneous Q24H  . ezetimibe  10 mg Oral Daily  . famotidine  20 mg Oral BID  . levothyroxine  100 mcg Oral QAC breakfast  . lisinopril  40 mg Oral Daily  . metoprolol succinate  100 mg Oral Daily  . mupirocin ointment  1 application Nasal BID   Continuous Infusions: . 0.9 % NaCl with KCl 20 mEq / L 1,000 mL (01/16/13 0400)    Principal Problem:   Pleural effusion on right Active Problems:   HTN (hypertension)   Hyperlipidemia   Osteoarthritis   GERD (gastroesophageal reflux disease)   CAD (coronary artery disease)   Hypothyroid   Anemia   Metacarpal bone fracture   Fall   Fx metacarpal    Time spent: 30 minutes    [facility] M  [facility] . If 7PM-7AM, please contact night-coverage at [email or web address], password [facility] 01/16/2013, 9:57 AM  LOS: 2 days   Attending note:  Patient seen and independently examined. Above note reviewed.   Patient was initially admitted for a left-sided pleural  effusion. She underwent thoracentesis yesterday with removal of 1700 cc of fluid. Followup chest x-ray did not reveal any acute abnormalities. Initial fluid analysis indicated an exudative effusion. She was seen in consultation by Dr.  Hawkins from pulmonology. Repeat CT of the chest was ordered to evaluate for any underlying malignancy. Results of CT chest today indicated a moderate right-sided hydropneumothorax. General surgery consultation was requested and Dr. Leticia Penna saw the patient. Chest tube was placed today with repeat chest x-ray currently pending.  Further workup revealed that patient has an ejection fraction of 30-35%. She does not have any signs of volume overload. She is already on a beta blocker, ACE inhibitor, aspirin, statin. Repeat CT of the chest did indicate a dense consolidation in the right lower lobe. This may be the etiology of her effusion, causing a parapneumonic effusion. Patient did have a mild fever yesterday evening as well. We will start the patient empirically on Levaquin for now.  Bessie Boyte

## 2013-01-17 DIAGNOSIS — J90 Pleural effusion, not elsewhere classified: Secondary | ICD-10-CM

## 2013-01-17 LAB — CBC
HCT: 34.8 % — ABNORMAL LOW (ref 36.0–46.0)
Hemoglobin: 11.5 g/dL — ABNORMAL LOW (ref 12.0–15.0)
MCH: 33 pg (ref 26.0–34.0)
MCHC: 33 g/dL (ref 30.0–36.0)
MCV: 99.7 fL (ref 78.0–100.0)
RBC: 3.49 MIL/uL — ABNORMAL LOW (ref 3.87–5.11)

## 2013-01-17 MED ORDER — FUROSEMIDE 20 MG PO TABS
20.0000 mg | ORAL_TABLET | Freq: Every day | ORAL | Status: DC
  Administered 2013-01-17 – 2013-01-21 (×5): 20 mg via ORAL
  Filled 2013-01-17 (×5): qty 1

## 2013-01-17 MED ORDER — SODIUM CHLORIDE 0.9 % IJ SOLN: 3.0000 mL | INTRAMUSCULAR | Status: DC | PRN

## 2013-01-17 MED ORDER — SODIUM CHLORIDE 0.9 % IJ SOLN
3.0000 mL | Freq: Two times a day (BID) | INTRAMUSCULAR | Status: DC
  Administered 2013-01-17 – 2013-01-21 (×6): 3 mL via INTRAVENOUS

## 2013-01-17 MED ORDER — SODIUM CHLORIDE 0.9 % IV SOLN
250.0000 mL | INTRAVENOUS | Status: DC | PRN
  Administered 2013-01-17: 250 mL via INTRAVENOUS

## 2013-01-17 NOTE — Progress Notes (Signed)
Subjective: No complaints of pain. No shortness of breath.  Objective: Vital signs in last 24 hours: Temp:  [97.4 F (36.3 C)-97.5 F (36.4 C)] 97.4 F (36.3 C) (05/31 0533) Pulse Rate:  [64-75] 71 (05/31 1329) Resp:  [11-20] 14 (05/31 0533) BP: (141-190)/(48-77) 151/48 mmHg (05/31 1329) SpO2:  [98 %-100 %] 100 % (05/31 0533) Weight:  [61.825 kg (136 lb 4.8 oz)] 61.825 kg (136 lb 4.8 oz) (05/31 0500) Last BM Date: 01/16/13  Intake/Output from previous day: 05/30 0701 - 05/31 0700 In: 180 [P.O.:180] Out: 1300 [Urine:1250; Chest Tube:50] Intake/Output this shift: Total I/O In: 3 [I.V.:3] Out: -   General appearance: alert and no distress Chest wall: Right-sided chest tube is in position. There is no air leak noted. There is serous sanguinous discharge. She remains to -20 cm of H2O suction.  Lab Results:   Recent Labs  01/16/13 0800 01/17/13 0637  WBC 3.5* 4.4  HGB 11.3* 11.5*  HCT 34.1* 34.8*  PLT 100* 100*   BMET  Recent Labs  01/15/13 0526 01/16/13 0800  NA 137 137  K 5.0 4.5  CL 103 103  CO2 28 28  GLUCOSE 83 89  BUN 18 15  CREATININE 0.91 0.79  CALCIUM 9.1 8.5   PT/INR No results found for this basename: LABPROT, INR,  in the last 72 hours ABG No results found for this basename: PHART, PCO2, PO2, HCO3,  in the last 72 hours  Studies/Results: Ct Chest W Contrast  01/16/2013   *RADIOLOGY REPORT*  Clinical Data: Evaluate pleural effusion  CT CHEST WITH CONTRAST  Technique:  Multidetector CT imaging of the chest was performed following the standard protocol during bolus administration of intravenous contrast.  Contrast: 80mL OMNIPAQUE IOHEXOL 300 MG/ML  SOLN .  Comparison: 01/14/2013.  Findings: There is a moderate size, right-sided hydro pneumothorax identified. There is airspace consolidation noted in the right lower lobe.  Again noted is an area of parenchymal scarring in the posterior aspect of the left upper lobe.  Mild diffuse bronchial wall  thickening noted.  The ascending aorta is ectatic measuring 4 cm in diameter, image 32/series 2.  This is unchanged from previous exam.  Scattered atherosclerotic vascular calcifications are noted.  No pericardial effusion.  No enlarged mediastinal or hilar lymph nodes.  There is no supraclavicular or axillary adenopathy. The bones are osteopenic. Compression deformity involving the T7 vertebra is unchanged from previous exam.  No new fractures.  Limited imaging through the upper abdomen shows a large gallstone. No acute findings identified.  Small cysts are noted within the upper poles of both kidneys.  The adrenal glands appear normal.  IMPRESSION:  1. Moderate right-sided hydropneumothorax. 2.  COPD. 3.  The similar ectasia of the ascending aorta. 4.  Stable age indeterminate T7 compression fracture.  Critical Value/emergent results were called by telephone at the time of interpretation on 01/16/2013 at 12:10 p.m.  to Dr. Juanetta Gosling, who verbally acknowledged these results.   Original Report Authenticated By: Signa Kell, M.D.   Dg Chest Port 1 View  01/16/2013   *RADIOLOGY REPORT*  Clinical Data: Right chest tube placement for pneumothorax.  PORTABLE CHEST - 1 VIEW 01/16/2013 1558 hours:  Comparison: CT chest earlier same date 1011 hours.  Portable chest x-ray yesterday.  Findings: Right chest tube in place with a persistent right apical pneumothorax on the order of 15-20% or so, probably not significantly changed since the CT earlier today.  Right pleural effusion, unchanged.  Patchy airspace opacities in the  right lower lobe, unchanged.  Left lung remains clear.  Cardiac silhouette normal in size for technique.  IMPRESSION: Right chest tube with residual approximate 15-20% or so right apical pneumothorax.  Moderate sized right pleural effusion. Patchy pneumonia in the right lower lobe.   Original Report Authenticated By: Hulan Saas, M.D.    Anti-infectives: Anti-infectives   Start     Dose/Rate  Route Frequency Ordered Stop   01/16/13 1700  levofloxacin (LEVAQUIN) IVPB 750 mg     750 mg 100 mL/hr over 90 Minutes Intravenous Every 24 hours 01/16/13 1611        Assessment/Plan: s/p * No surgery found * Right-sided hemopneumothorax. Continue chest tube to suction at this time. If she continues not demonstrate air leak and morning chest x-ray looks improved will likely switch to water seal tomorrow with hopeful discontinuation of the chest tube on Monday. This is been discussed with patient and family.  LOS: 3 days    Rex Oesterle C 01/17/2013

## 2013-01-17 NOTE — Progress Notes (Addendum)
[facility] PROGRESS NOTE  Allison Collier UXN:[id number] DOB: August 16, 1921 DOA: 01/14/2013 PCP: Rudi Heap, MD  Assessment/Plan: Pleural effusion on right  Possible late manifestation of a chronic condition : s/p thoracentesis that yielded 1700cc fluid. Chest xray improved with no suggestion infiltrate.  Preliminary report indicates exudative process. Fluid anyalysis yields normal protien, LD 88, glucose 111. Cytology pending. CT of the chest yesterday indicated a dense right lower lobe consolidation. Effusion may be parapneumonic. She was started on antibiotics yesterday.  Hydropneumothorax. Patient was found to have a right hydropneumothorax yesterday on CT scan. Interestingly, chest x-ray post thoracentesis did not indicate any evidence of pneumothorax. Patient was seen by general surgery and chest tube was placed. She appears to be tolerating this well.  Chronic systolic congestive heart failure. Ejection fraction was noted to be 30-35% on echocardiogram. Will start the patient on low-dose Lasix. She is already on aspirin, ACE inhibitor, beta blocker, statin. She's not having any chest pain.  Community acquired pneumonia. Possibly the cause of underlying pleural effusion. Patient has been started on Levaquin. This is not evident on initial CT chest/chest x-ray on admission due to significant overlying pleural effusion.  HTN: improved control. May be related to pain left hand. Continue home meds and monitor.   Fx left fifth metacarpal: splint in dry and intact. Fingers warm to touch.   . Hypothyroid: TSH 10.1. Will check free T4 and T3. Will increase synthroid. Will need OP follow up.   . Hyperlipidemia : continue statin   . Osteoarthritis and gait instability :  PT evaluation recommend SNF.  Recurrent falls likely due to gait instability   . GERD (gastroesophageal reflux disease): stable at baseline.    Code Status: full Family Communication: Discussed with patient and  daughter at the bedside Disposition Plan: Physical therapy is recommended skilled nursing facility. Family distal deciding whether they wish take her home or not.   Consultants: Pulmonary General surgery  Procedures:  Thoracentesis 01/15/13  Chest tube placement by Gen. surgery on 01/16/13  Antibiotics: Levaquin 5/30  HPI/Subjective: Denies any shortness of breath. She does have a productive cough.  Objective: Filed Vitals:   01/17/13 0500 01/17/13 0533 01/17/13 1329 01/17/13 1500  BP:  168/69 151/48 145/67  Pulse:  73 71 76  Temp:  97.4 F (36.3 C)  98.2 F (36.8 C)  TempSrc:  Oral  Oral  Resp:  14  18  Height:      Weight: 61.825 kg (136 lb 4.8 oz)     SpO2:  100%  100%    Intake/Output Summary (Last 24 hours) at 01/17/13 1706 Last data filed at 01/17/13 1344  Gross per 24 hour  Intake      3 ml  Output    300 ml  Net   -297 ml   Filed Weights   01/14/13 2232 01/16/13 0500 01/17/13 0500  Weight: 58.695 kg (129 lb 6.4 oz) 60.4 kg (133 lb 2.5 oz) 61.825 kg (136 lb 4.8 oz)    Exam:   General:  Frail NAD  Cardiovascular: RRR No MGR, with trace LE edema  Respiratory: Normal effort. Bilateral breath sounds are present. Chest tube from right chest noted  Abdomen: flat soft +BS non-tender to palpation  Musculoskeletal: no clubbing no cyanosis   Data Reviewed: Basic Metabolic Panel:  Recent Labs Lab 01/14/13 1611 01/15/13 0526 01/16/13 0800  NA 139 137 137  K 4.4 5.0 4.5  CL 102 103 103  CO2 29 28 28   GLUCOSE 111*  83 89  BUN 18 18 15   CREATININE 0.89 0.91 0.79  CALCIUM 9.8 9.1 8.5   Liver Function Tests:  Recent Labs Lab 01/15/13 0530  AST 15  ALT 10  ALKPHOS 74  BILITOT 0.5  PROT 6.0  ALBUMIN 3.0*   No results found for this basename: LIPASE, AMYLASE,  in the last 168 hours No results found for this basename: AMMONIA,  in the last 168 hours CBC:  Recent Labs Lab 01/14/13 1611 01/15/13 0526 01/16/13 0800 01/17/13 0637  WBC 4.9  4.8 3.5* 4.4  NEUTROABS 3.8  --   --   --   HGB 12.7 11.4* 11.3* 11.5*  HCT 38.9 34.8* 34.1* 34.8*  MCV 100.5* 100.3* 100.9* 99.7  PLT 137* 130* 100* 100*   Cardiac Enzymes: No results found for this basename: CKTOTAL, CKMB, CKMBINDEX, TROPONINI,  in the last 168 hours BNP (last 3 results)  Recent Labs  01/15/13 0526  PROBNP 1699.0*   CBG: No results found for this basename: GLUCAP,  in the last 168 hours  Recent Results (from the past 240 hour(s))  MRSA PCR SCREENING     Status: Abnormal   Collection Time    01/15/13  8:44 AM      Result Value Range Status   MRSA by PCR POSITIVE (*) NEGATIVE Final   Comment:            The GeneXpert MRSA Assay (FDA     approved for NASAL specimens     only), is one component of a     comprehensive MRSA colonization     surveillance program. It is not     intended to diagnose MRSA     infection nor to guide or     monitor treatment for     MRSA infections.     RESULT CALLED TO, READ BACK BY AND VERIFIED WITH:     BUCKNER,J. AT 1548 ON 01/15/2013 BY BAUGHAM,M.  BODY FLUID CULTURE     Status: None   Collection Time    01/15/13 11:13 AM      Result Value Range Status   Specimen Description     Final   Value: FLUID RIGHT PLEURAL EFFUSION COLLECTED BY DOCTOR BOLES   Special Requests NONE   Final   Gram Stain     Final   Value: NO WBC SEEN     NO ORGANISMS SEEN   Culture NO GROWTH 2 DAYS   Final   Report Status PENDING   Incomplete     Studies: Ct Chest W Contrast  01/16/2013   *RADIOLOGY REPORT*  Clinical Data: Evaluate pleural effusion  CT CHEST WITH CONTRAST  Technique:  Multidetector CT imaging of the chest was performed following the standard protocol during bolus administration of intravenous contrast.  Contrast: 80mL OMNIPAQUE IOHEXOL 300 MG/ML  SOLN .  Comparison: 01/14/2013.  Findings: There is a moderate size, right-sided hydro pneumothorax identified. There is airspace consolidation noted in the right lower lobe.  Again noted  is an area of parenchymal scarring in the posterior aspect of the left upper lobe.  Mild diffuse bronchial wall thickening noted.  The ascending aorta is ectatic measuring 4 cm in diameter, image 32/series 2.  This is unchanged from previous exam.  Scattered atherosclerotic vascular calcifications are noted.  No pericardial effusion.  No enlarged mediastinal or hilar lymph nodes.  There is no supraclavicular or axillary adenopathy. The bones are osteopenic. Compression deformity involving the T7 vertebra is unchanged from previous  exam.  No new fractures.  Limited imaging through the upper abdomen shows a large gallstone. No acute findings identified.  Small cysts are noted within the upper poles of both kidneys.  The adrenal glands appear normal.  IMPRESSION:  1. Moderate right-sided hydropneumothorax. 2.  COPD. 3.  The similar ectasia of the ascending aorta. 4.  Stable age indeterminate T7 compression fracture.  Critical Value/emergent results were called by telephone at the time of interpretation on 01/16/2013 at 12:10 p.m.  to Dr. Juanetta Gosling, who verbally acknowledged these results.   Original Report Authenticated By: Signa Kell, M.D.   Dg Chest Port 1 View  01/16/2013   *RADIOLOGY REPORT*  Clinical Data: Right chest tube placement for pneumothorax.  PORTABLE CHEST - 1 VIEW 01/16/2013 1558 hours:  Comparison: CT chest earlier same date 1011 hours.  Portable chest x-ray yesterday.  Findings: Right chest tube in place with a persistent right apical pneumothorax on the order of 15-20% or so, probably not significantly changed since the CT earlier today.  Right pleural effusion, unchanged.  Patchy airspace opacities in the right lower lobe, unchanged.  Left lung remains clear.  Cardiac silhouette normal in size for technique.  IMPRESSION: Right chest tube with residual approximate 15-20% or so right apical pneumothorax.  Moderate sized right pleural effusion. Patchy pneumonia in the right lower lobe.   Original  Report Authenticated By: Hulan Saas, M.D.    Scheduled Meds: . aspirin EC  81 mg Oral Daily  . atorvastatin  40 mg Oral q1800  . Chlorhexidine Gluconate Cloth  6 each Topical Q0600  . enoxaparin (LOVENOX) injection  40 mg Subcutaneous Q24H  . ezetimibe  10 mg Oral Daily  . famotidine  20 mg Oral BID  . levofloxacin (LEVAQUIN) IV  750 mg Intravenous Q24H  . levothyroxine  112 mcg Oral QAC breakfast  . lisinopril  40 mg Oral Daily  . metoprolol succinate  100 mg Oral Daily  . mupirocin ointment  1 application Nasal BID  . sodium chloride  3 mL Intravenous Q12H   Continuous Infusions:    Principal Problem:   Pleural effusion on right Active Problems:   HTN (hypertension)   Hyperlipidemia   Osteoarthritis   GERD (gastroesophageal reflux disease)   CAD (coronary artery disease)   Hypothyroid   Anemia   Metacarpal bone fracture   Fall   Fx metacarpal   CAP (community acquired pneumonia)   Hydropneumothorax   Chronic systolic CHF (congestive heart failure)    Time spent: 30 minutes    MEMON,JEHANZEB  [facility] . If 7PM-7AM, please contact night-coverage at [email or web address], password [facility] 01/17/2013, 5:06 PM  LOS: 3 days

## 2013-01-18 ENCOUNTER — Inpatient Hospital Stay (HOSPITAL_COMMUNITY): Payer: Medicare Other

## 2013-01-18 LAB — CBC
MCV: 99.1 fL (ref 78.0–100.0)
Platelets: 116 10*3/uL — ABNORMAL LOW (ref 150–400)
RBC: 3.21 MIL/uL — ABNORMAL LOW (ref 3.87–5.11)
WBC: 3.1 10*3/uL — ABNORMAL LOW (ref 4.0–10.5)

## 2013-01-18 LAB — BASIC METABOLIC PANEL
CO2: 28 mEq/L (ref 19–32)
Calcium: 8.6 mg/dL (ref 8.4–10.5)
Sodium: 133 mEq/L — ABNORMAL LOW (ref 135–145)

## 2013-01-18 LAB — BODY FLUID CULTURE: Culture: NO GROWTH

## 2013-01-18 NOTE — Progress Notes (Signed)
Patient had 150 cc of serosanguineous drainage from chest tube from 7p-6a.

## 2013-01-18 NOTE — Progress Notes (Signed)
  Subjective: No complaints. No shortness of breath  Objective: Vital signs in last 24 hours: Temp:  [97.5 F (36.4 Collier)-98.2 F (36.8 Collier)] 97.5 F (36.4 Collier) (06/01 0433) Pulse Rate:  [72-76] 76 (06/01 0433) Resp:  [18-20] 20 (06/01 0433) BP: (129-145)/(54-92) 129/92 mmHg (06/01 0433) SpO2:  [100 %] 100 % (06/01 0433) Weight:  [59.693 kg (131 lb 9.6 oz)] 59.693 kg (131 lb 9.6 oz) (06/01 0433) Last BM Date: 01/16/13  Intake/Output from previous day: 05/31 0701 - 06/01 0700 In: 633 [P.O.:480; I.V.:3; IV Piggyback:150] Out: 2950 [Urine:2000; Chest Tube:950] Intake/Output this shift: Total I/O In: 243 [P.O.:240; I.V.:3] Out: -   Chest wall: no tenderness, Chest tube is in position. Moderate serous sanguinous discharge. No significant air leak remains to -20 cm H2O suction  Lab Results:   Recent Labs  01/17/13 0637 01/18/13 0555  WBC 4.4 3.1*  HGB 11.5* 10.7*  HCT 34.8* 31.8*  PLT 100* 116*   BMET  Recent Labs  01/16/13 0800 01/18/13 0555  NA 137 133*  K 4.5 4.2  CL 103 101  CO2 28 28  GLUCOSE 89 88  BUN 15 14  CREATININE 0.79 0.79  CALCIUM 8.5 8.6   PT/INR No results found for this basename: LABPROT, INR,  in the last 72 hours ABG No results found for this basename: PHART, PCO2, PO2, HCO3,  in the last 72 hours  Studies/Results: Dg Chest Port 1 View  01/18/2013   *RADIOLOGY REPORT*  Clinical Data: Pneumothorax  PORTABLE CHEST - 1 VIEW  Comparison: 01/16/2013  Findings: Persistent right apical pneumothorax, lung apex projecting at the level of the right third rib posteriorly.  Chest tube remains in place.  Heart size normal.  Left lung clear. Coarse interstitial opacities at the right lung base slightly improved.  No effusion.  Atheromatous aorta.  IMPRESSION:  Stable right chest tube with slight decrease in size of right apical pneumothorax.   Original Report Authenticated By: D. Andria Rhein, MD   Dg Chest Port 1 View  01/16/2013   *RADIOLOGY REPORT*  Clinical  Data: Right chest tube placement for pneumothorax.  PORTABLE CHEST - 1 VIEW 01/16/2013 1558 hours:  Comparison: CT chest earlier same date 1011 hours.  Portable chest x-ray yesterday.  Findings: Right chest tube in place with a persistent right apical pneumothorax on the order of 15-20% or so, probably not significantly changed since the CT earlier today.  Right pleural effusion, unchanged.  Patchy airspace opacities in the right lower lobe, unchanged.  Left lung remains clear.  Cardiac silhouette normal in size for technique.  IMPRESSION: Right chest tube with residual approximate 15-20% or so right apical pneumothorax.  Moderate sized right pleural effusion. Patchy pneumonia in the right lower lobe.   Original Report Authenticated By: Hulan Saas, M.D.    Anti-infectives: Anti-infectives   Start     Dose/Rate Route Frequency Ordered Stop   01/16/13 1700  levofloxacin (LEVAQUIN) IVPB 750 mg     750 mg 100 mL/hr over 90 Minutes Intravenous Every 24 hours 01/16/13 1611        Assessment/Plan: s/p * No surgery found * Right-sided pneumothorax and pleural effusion. Continue chest tube to suction for the next 24 hours. If pneumothorax continues to resolve we'll consider placement to water seal tomorrow. Family aware  LOS: 4 days    Allison Collier 01/18/2013

## 2013-01-18 NOTE — Progress Notes (Signed)
[facility] PROGRESS NOTE  GRABIELA WOHLFORD WUJ:[id number] DOB: Nov 22, 1920 DOA: 01/14/2013 PCP: Rudi Heap, MD  Assessment/Plan: Pleural effusion on right  Possible late manifestation of a chronic condition : s/p thoracentesis that yielded 1700cc fluid. Chest xray improved with no suggestion infiltrate.  Preliminary report indicates exudative process. Fluid anyalysis yields normal protien, LD 88, glucose 111. Cytology pending. CT of the chest yesterday indicated a dense right lower lobe consolidation. Effusion may be parapneumonic. She was started on antibiotics yesterday.  Hydropneumothorax. Patient was found to have a right hydropneumothorax yesterday on CT scan. Interestingly, chest x-ray post thoracentesis did not indicate any evidence of pneumothorax. Patient was seen by general surgery and chest tube was placed. She appears to be tolerating this well.  Chronic systolic congestive heart failure. Ejection fraction was noted to be 30-35% on echocardiogram. Will start the patient on low-dose Lasix. She is already on aspirin, ACE inhibitor, beta blocker, statin. She's not having any chest pain.  Community acquired pneumonia. Possibly the cause of underlying pleural effusion. Patient is on Levaquin. This was not evident on initial CT chest/chest x-ray on admission due to significant overlying pleural effusion.  HTN: improved control. May be related to pain left hand. Continue home meds and monitor.   Fx left fifth metacarpal: splint in dry and intact. Fingers warm to touch.   . Hypothyroid: TSH 10.1. Will check free T4 and T3. Will increase synthroid. Will need OP follow up.   . Hyperlipidemia : continue statin   . Osteoarthritis and gait instability :  PT evaluation recommend SNF.  Recurrent falls likely due to gait instability   . GERD (gastroesophageal reflux disease): stable at baseline.    Code Status: full Family Communication: Discussed with patient and daughter at the  bedside Disposition Plan: Physical therapy is recommended skilled nursing facility. Family still deciding whether they wish to take her home or not.   Consultants: Pulmonary General surgery  Procedures:  Thoracentesis 01/15/13  Chest tube placement by Gen. surgery on 01/16/13  Antibiotics: Levaquin 5/30  HPI/Subjective: Denies any shortness of breath. Complains of cough again, productive.  Objective: Filed Vitals:   01/17/13 2216 01/18/13 0433 01/18/13 1515 01/18/13 1835  BP: 133/54 129/92 130/73 142/61  Pulse: 72 76 77 73  Temp: 97.9 F (36.6 C) 97.5 F (36.4 C) 98.2 F (36.8 C) 98.6 F (37 C)  TempSrc: Axillary Oral Oral Oral  Resp: 20 20 20 20   Height:      Weight:  59.693 kg (131 lb 9.6 oz)    SpO2: 100% 100% 100% 100%    Intake/Output Summary (Last 24 hours) at 01/18/13 1935 Last data filed at 01/18/13 1818  Gross per 24 hour  Intake    753 ml  Output   2450 ml  Net  -1697 ml   Filed Weights   01/16/13 0500 01/17/13 0500 01/18/13 0433  Weight: 60.4 kg (133 lb 2.5 oz) 61.825 kg (136 lb 4.8 oz) 59.693 kg (131 lb 9.6 oz)    Exam:   General:  Frail NAD  Cardiovascular: RRR No MGR, with trace LE edema  Respiratory: Normal effort. Bilateral breath sounds are present. Chest tube from right chest noted  Abdomen: flat soft +BS non-tender to palpation  Musculoskeletal: no clubbing no cyanosis   Data Reviewed: Basic Metabolic Panel:  Recent Labs Lab 01/14/13 1611 01/15/13 0526 01/16/13 0800 01/18/13 0555  NA 139 137 137 133*  K 4.4 5.0 4.5 4.2  CL 102 103 103 101  CO2 29  28 28 28   GLUCOSE 111* 83 89 88  BUN 18 18 15 14   CREATININE 0.89 0.91 0.79 0.79  CALCIUM 9.8 9.1 8.5 8.6   Liver Function Tests:  Recent Labs Lab 01/15/13 0530  AST 15  ALT 10  ALKPHOS 74  BILITOT 0.5  PROT 6.0  ALBUMIN 3.0*   No results found for this basename: LIPASE, AMYLASE,  in the last 168 hours No results found for this basename: AMMONIA,  in the last 168  hours CBC:  Recent Labs Lab 01/14/13 1611 01/15/13 0526 01/16/13 0800 01/17/13 0637 01/18/13 0555  WBC 4.9 4.8 3.5* 4.4 3.1*  NEUTROABS 3.8  --   --   --   --   HGB 12.7 11.4* 11.3* 11.5* 10.7*  HCT 38.9 34.8* 34.1* 34.8* 31.8*  MCV 100.5* 100.3* 100.9* 99.7 99.1  PLT 137* 130* 100* 100* 116*   Cardiac Enzymes: No results found for this basename: CKTOTAL, CKMB, CKMBINDEX, TROPONINI,  in the last 168 hours BNP (last 3 results)  Recent Labs  01/15/13 0526  PROBNP 1699.0*   CBG: No results found for this basename: GLUCAP,  in the last 168 hours  Recent Results (from the past 240 hour(s))  MRSA PCR SCREENING     Status: Abnormal   Collection Time    01/15/13  8:44 AM      Result Value Range Status   MRSA by PCR POSITIVE (*) NEGATIVE Final   Comment:            The GeneXpert MRSA Assay (FDA     approved for NASAL specimens     only), is one component of a     comprehensive MRSA colonization     surveillance program. It is not     intended to diagnose MRSA     infection nor to guide or     monitor treatment for     MRSA infections.     RESULT CALLED TO, READ BACK BY AND VERIFIED WITH:     BUCKNER,J. AT 1548 ON 01/15/2013 BY BAUGHAM,M.  BODY FLUID CULTURE     Status: None   Collection Time    01/15/13 11:13 AM      Result Value Range Status   Specimen Description     Final   Value: FLUID RIGHT PLEURAL EFFUSION COLLECTED BY DOCTOR BOLES   Special Requests NONE   Final   Gram Stain     Final   Value: NO WBC SEEN     NO ORGANISMS SEEN   Culture NO GROWTH 3 DAYS   Final   Report Status 01/18/2013 FINAL   Final  CULTURE, BLOOD (ROUTINE X 2)     Status: None   Collection Time    01/15/13  7:45 PM      Result Value Range Status   Specimen Description BLOOD RIGHT ANTECUBITAL   Final   Special Requests BOTTLES DRAWN AEROBIC AND ANAEROBIC 8CC   Final   Culture NO GROWTH 3 DAYS   Final   Report Status PENDING   Incomplete  CULTURE, BLOOD (ROUTINE X 2)     Status: None    Collection Time    01/15/13  7:50 PM      Result Value Range Status   Specimen Description BLOOD RIGHT HAND   Final   Special Requests BOTTLES DRAWN AEROBIC AND ANAEROBIC 8CC   Final   Culture NO GROWTH 3 DAYS   Final   Report Status PENDING   Incomplete  Studies: Dg Chest Port 1 View  01/18/2013   *RADIOLOGY REPORT*  Clinical Data: Pneumothorax  PORTABLE CHEST - 1 VIEW  Comparison: 01/16/2013  Findings: Persistent right apical pneumothorax, lung apex projecting at the level of the right third rib posteriorly.  Chest tube remains in place.  Heart size normal.  Left lung clear. Coarse interstitial opacities at the right lung base slightly improved.  No effusion.  Atheromatous aorta.  IMPRESSION:  Stable right chest tube with slight decrease in size of right apical pneumothorax.   Original Report Authenticated By: D. Andria Rhein, MD    Scheduled Meds: . aspirin EC  81 mg Oral Daily  . atorvastatin  40 mg Oral q1800  . Chlorhexidine Gluconate Cloth  6 each Topical Q0600  . enoxaparin (LOVENOX) injection  40 mg Subcutaneous Q24H  . ezetimibe  10 mg Oral Daily  . famotidine  20 mg Oral BID  . furosemide  20 mg Oral Daily  . levofloxacin (LEVAQUIN) IV  750 mg Intravenous Q24H  . levothyroxine  112 mcg Oral QAC breakfast  . lisinopril  40 mg Oral Daily  . metoprolol succinate  100 mg Oral Daily  . mupirocin ointment  1 application Nasal BID  . sodium chloride  3 mL Intravenous Q12H   Continuous Infusions:    Principal Problem:   Pleural effusion on right Active Problems:   HTN (hypertension)   Hyperlipidemia   Osteoarthritis   GERD (gastroesophageal reflux disease)   CAD (coronary artery disease)   Hypothyroid   Anemia   Metacarpal bone fracture   Fall   Fx metacarpal   CAP (community acquired pneumonia)   Hydropneumothorax   Chronic systolic CHF (congestive heart failure)    Time spent: 30 minutes    MEMON,JEHANZEB  [facility] . If 7PM-7AM, please  contact night-coverage at [email or web address], password [facility] 01/18/2013, 7:35 PM  LOS: 4 days

## 2013-01-19 ENCOUNTER — Inpatient Hospital Stay (HOSPITAL_COMMUNITY): Payer: Medicare Other

## 2013-01-19 LAB — BASIC METABOLIC PANEL
BUN: 15 mg/dL (ref 6–23)
CO2: 30 mEq/L (ref 19–32)
Chloride: 99 mEq/L (ref 96–112)
Glucose, Bld: 85 mg/dL (ref 70–99)
Potassium: 4.3 mEq/L (ref 3.5–5.1)

## 2013-01-19 LAB — CBC
HCT: 31.8 % — ABNORMAL LOW (ref 36.0–46.0)
Hemoglobin: 10.7 g/dL — ABNORMAL LOW (ref 12.0–15.0)
MCHC: 33.6 g/dL (ref 30.0–36.0)
RBC: 3.2 MIL/uL — ABNORMAL LOW (ref 3.87–5.11)
WBC: 3.7 10*3/uL — ABNORMAL LOW (ref 4.0–10.5)

## 2013-01-19 MED ORDER — LEVOFLOXACIN IN D5W 750 MG/150ML IV SOLN: 750.0000 mg | INTRAVENOUS | Status: DC

## 2013-01-19 MED ORDER — FAMOTIDINE 20 MG PO TABS
20.0000 mg | ORAL_TABLET | Freq: Every day | ORAL | Status: DC
  Administered 2013-01-20 – 2013-01-21 (×2): 20 mg via ORAL
  Filled 2013-01-19 (×2): qty 1

## 2013-01-19 NOTE — Progress Notes (Deleted)
OT Cancellation Note  Patient Details Name: Allison Collier MRN: [id number] DOB: 03-19-1921   Cancelled Treatment:    Reason Eval/Treat Not Completed: Other (comment). Pt unable to participate with OT eval at this time. Pt presently eating lunch at this time. Will re-attempt at a later date.   Limmie Patricia, OTR/L,CBIS   01/19/2013, 1:26 PM

## 2013-01-19 NOTE — Progress Notes (Signed)
Pt's renal function continues to worsen since admission.  The following medications were renally adjusted for estimated CrCl<78ml/min: Pepcid changed to q24h Levaquin changed to q48h  Junita Push, PharmD, BCPS 01/19/2013@11 :23 AM

## 2013-01-19 NOTE — Progress Notes (Signed)
  Subjective: Comfortable. No shortness of breath.  Objective: Vital signs in last 24 hours: Temp:  [97.3 F (36.3 C)-98.6 F (37 C)] 97.3 F (36.3 C) (06/02 1014) Pulse Rate:  [70-77] 71 (06/02 1014) Resp:  [16-20] 18 (06/02 1014) BP: (130-160)/(61-80) 138/80 mmHg (06/02 1014) SpO2:  [95 %-100 %] 98 % (06/02 1014) Weight:  [59.013 kg (130 lb 1.6 oz)] 59.013 kg (130 lb 1.6 oz) (06/02 0500) Last BM Date: 01/16/13  Intake/Output from previous day: 06/01 0701 - 06/02 0700 In: 753 [P.O.:600; I.V.:3; IV Piggyback:150] Out: 2100 [Urine:2100] Intake/Output this shift: Total I/O In: 120 [P.O.:120] Out: -   General appearance: alert and no distress Chest wall: no tenderness, Chest tube is in position. No air leak. Approximately 150 ML serous and is discharge since yesterday  Lab Results:   Recent Labs  01/18/13 0555 01/19/13 0444  WBC 3.1* 3.7*  HGB 10.7* 10.7*  HCT 31.8* 31.8*  PLT 116* 125*   BMET  Recent Labs  01/18/13 0555 01/19/13 0444  NA 133* 135  K 4.2 4.3  CL 101 99  CO2 28 30  GLUCOSE 88 85  BUN 14 15  CREATININE 0.79 0.95  CALCIUM 8.6 8.9   PT/INR No results found for this basename: LABPROT, INR,  in the last 72 hours ABG No results found for this basename: PHART, PCO2, PO2, HCO3,  in the last 72 hours  Studies/Results: Dg Chest Port 1 View  01/19/2013   *RADIOLOGY REPORT*  Clinical Data: Right-sided pneumothorax.  PORTABLE CHEST - 1 VIEW  Comparison: 01/18/2013.  Findings: A small right apical pneumothorax is decreased slightly since the prior exam.  Right basilar airspace disease likely reflects atelectasis.  A small right pleural effusion is suspected.  The heart size is normal.  The left lung is clear.  The visualized soft tissues and bony thorax are unremarkable. The right-sided chest tube remains in place.  Patient is slightly rotated.  The tube is not significantly changed.  IMPRESSION:  1.  Stable to slight decrease and a small right apical  pneumothorax. 2.  Chest tube position is unchanged. 3.  Suspect small right pleural effusion and minimal right basilar atelectasis.   Original Report Authenticated By: Marin Roberts, M.D.   Dg Chest Port 1 View  01/18/2013   *RADIOLOGY REPORT*  Clinical Data: Pneumothorax  PORTABLE CHEST - 1 VIEW  Comparison: 01/16/2013  Findings: Persistent right apical pneumothorax, lung apex projecting at the level of the right third rib posteriorly.  Chest tube remains in place.  Heart size normal.  Left lung clear. Coarse interstitial opacities at the right lung base slightly improved.  No effusion.  Atheromatous aorta.  IMPRESSION:  Stable right chest tube with slight decrease in size of right apical pneumothorax.   Original Report Authenticated By: D. Andria Rhein, MD    Anti-infectives: Anti-infectives   Start     Dose/Rate Route Frequency Ordered Stop   01/16/13 1700  levofloxacin (LEVAQUIN) IVPB 750 mg     750 mg 100 mL/hr over 90 Minutes Intravenous Every 24 hours 01/16/13 1611        Assessment/Plan: s/p * No surgery found * Right-sided pneumothorax and effusion. Will place the chest tube to waterseal today. Monitor for 24 hours. Repeat chest x-ray in morning. If pneumothorax is stable or resolved we'll plan to remove chest tube tomorrow.  LOS: 5 days    Allison Collier C 01/19/2013

## 2013-01-19 NOTE — Progress Notes (Signed)
[facility] PROGRESS NOTE  Allison Collier:[id number] DOB: 12/26/1920 DOA: 01/14/2013 PCP: Rudi Heap, MD  Assessment/Plan: Pleural effusion on right  Possible late manifestation of a chronic condition : s/p thoracentesis that yielded 1700cc fluid. Chest xray improved with no suggestion infiltrate. Preliminary report indicates exudative process. Fluid anyalysis yields normal protien, LD 88, glucose 111. CT of the chest 01/16/13 indicated a dense right lower lobe consolidation. Effusion may be parapneumonic. She was started on antibiotics 01/17/13 .  Hydropneumothorax. Patient was found to have a right hydropneumothorax 01/16/13 on CT scan. Interestingly, chest x-ray post thoracentesis did not indicate any evidence of pneumothorax. Patient was seen by general surgery and chest tube was placed. She continues to tolerate well. Surgery note indicates changing to water seal today. If chest xray in am ok will likely dc chest tube tomorrow.   Chronic systolic congestive heart failure. Ejection fraction was noted to be 30-35% on echocardiogram. Continue low-dose Lasix. She is already on aspirin, ACE inhibitor, beta blocker, statin. She's not having any chest pain. Volume status -4.6. Wt 59.0 from 61.4 on admission  Community acquired pneumonia. Possibly the cause of underlying pleural effusion. Patient is on Levaquin day #3. This was not evident on initial CT chest/chest x-ray on admission due to significant overlying pleural effusion.   HTN: improved control. Continue home meds and monitor.   Fx left fifth metacarpal: splint in dry and intact. Fingers warm to touch.   . Hypothyroid: TSH 10.1. Will check free T4 and T3. Will increase synthroid. Will need OP follow up.   . Hyperlipidemia : continue statin   . Osteoarthritis and gait instability : PT evaluation recommend SNF. Recurrent falls likely due to gait instability   . GERD (gastroesophageal reflux disease): stable at baseline.     Code Status: full Family Communication: daughter at bedside Disposition Plan: home when ready. PT recommended snf   Consultants:  Pulmonary  General surgery   Procedures:  Thoracentesis 01/15/13  Chest tube placement 01/16/13  Antibiotics:  levaquin 01/16/13  HPI/Subjective: Watching tv. Denies pain/discomfort  Objective: Filed Vitals:   01/18/13 2018 01/18/13 2100 01/19/13 0500 01/19/13 1014  BP:  139/68 160/72 138/80  Pulse:  70 76 71  Temp:  98.1 F (36.7 C) 97.9 F (36.6 C) 97.3 F (36.3 C)  TempSrc:  Oral Oral Oral  Resp:  16 18 18   Height:      Weight:   59.013 kg (130 lb 1.6 oz)   SpO2: 99% 100% 95% 98%    Intake/Output Summary (Last 24 hours) at 01/19/13 1311 Last data filed at 01/19/13 1124  Gross per 24 hour  Intake    510 ml  Output   2050 ml  Net  -1540 ml   Filed Weights   01/17/13 0500 01/18/13 0433 01/19/13 0500  Weight: 61.825 kg (136 lb 4.8 oz) 59.693 kg (131 lb 9.6 oz) 59.013 kg (130 lb 1.6 oz)    Exam:   General:  Frail, pale, NAD  Cardiovascular: RRR No MGR trace LE edema  Respiratory: normal effort BS distant. No wheeze no rhonchi chest tube right intact  Abdomen: flat soft +BS non-tender to palpation  Musculoskeletal: no clubbing no cyanosis   Data Reviewed: Basic Metabolic Panel:  Recent Labs Lab 01/14/13 1611 01/15/13 0526 01/16/13 0800 01/18/13 0555 01/19/13 0444  NA 139 137 137 133* 135  K 4.4 5.0 4.5 4.2 4.3  CL 102 103 103 101 99  CO2 29 28 28 28 30   GLUCOSE 111*  83 89 88 85  BUN 18 18 15 14 15   CREATININE 0.89 0.91 0.79 0.79 0.95  CALCIUM 9.8 9.1 8.5 8.6 8.9   Liver Function Tests:  Recent Labs Lab 01/15/13 0530  AST 15  ALT 10  ALKPHOS 74  BILITOT 0.5  PROT 6.0  ALBUMIN 3.0*     CBC:  Recent Labs Lab 01/14/13 1611 01/15/13 0526 01/16/13 0800 01/17/13 0637 01/18/13 0555 01/19/13 0444  WBC 4.9 4.8 3.5* 4.4 3.1* 3.7*  NEUTROABS 3.8  --   --   --   --   --   HGB 12.7 11.4* 11.3*  11.5* 10.7* 10.7*  HCT 38.9 34.8* 34.1* 34.8* 31.8* 31.8*  MCV 100.5* 100.3* 100.9* 99.7 99.1 99.4  PLT 137* 130* 100* 100* 116* 125*     BNP (last 3 results)  Recent Labs  01/15/13 0526  PROBNP 1699.0*   CBG: No results found for this basename: GLUCAP,  in the last 168 hours  Recent Results (from the past 240 hour(s))  MRSA PCR SCREENING     Status: Abnormal   Collection Time    01/15/13  8:44 AM      Result Value Range Status   MRSA by PCR POSITIVE (*) NEGATIVE Final   Comment:            The GeneXpert MRSA Assay (FDA     approved for NASAL specimens     only), is one component of a     comprehensive MRSA colonization     surveillance program. It is not     intended to diagnose MRSA     infection nor to guide or     monitor treatment for     MRSA infections.     RESULT CALLED TO, READ BACK BY AND VERIFIED WITH:     BUCKNER,J. AT 1548 ON 01/15/2013 BY BAUGHAM,M.  BODY FLUID CULTURE     Status: None   Collection Time    01/15/13 11:13 AM      Result Value Range Status   Specimen Description     Final   Value: FLUID RIGHT PLEURAL EFFUSION COLLECTED BY DOCTOR BOLES   Special Requests NONE   Final   Gram Stain     Final   Value: NO WBC SEEN     NO ORGANISMS SEEN   Culture NO GROWTH 3 DAYS   Final   Report Status 01/18/2013 FINAL   Final  CULTURE, BLOOD (ROUTINE X 2)     Status: None   Collection Time    01/15/13  7:45 PM      Result Value Range Status   Specimen Description BLOOD RIGHT ANTECUBITAL   Final   Special Requests BOTTLES DRAWN AEROBIC AND ANAEROBIC 8CC   Final   Culture NO GROWTH 4 DAYS   Final   Report Status PENDING   Incomplete  CULTURE, BLOOD (ROUTINE X 2)     Status: None   Collection Time    01/15/13  7:50 PM      Result Value Range Status   Specimen Description BLOOD RIGHT HAND   Final   Special Requests BOTTLES DRAWN AEROBIC AND ANAEROBIC 8CC   Final   Culture NO GROWTH 4 DAYS   Final   Report Status PENDING   Incomplete      Studies: Dg Chest Port 1 View  01/19/2013   *RADIOLOGY REPORT*  Clinical Data: Right-sided pneumothorax.  PORTABLE CHEST - 1 VIEW  Comparison: 01/18/2013.  Findings: A small  right apical pneumothorax is decreased slightly since the prior exam.  Right basilar airspace disease likely reflects atelectasis.  A small right pleural effusion is suspected.  The heart size is normal.  The left lung is clear.  The visualized soft tissues and bony thorax are unremarkable. The right-sided chest tube remains in place.  Patient is slightly rotated.  The tube is not significantly changed.  IMPRESSION:  1.  Stable to slight decrease and a small right apical pneumothorax. 2.  Chest tube position is unchanged. 3.  Suspect small right pleural effusion and minimal right basilar atelectasis.   Original Report Authenticated By: Marin Roberts, M.D.   Dg Chest Port 1 View  01/18/2013   *RADIOLOGY REPORT*  Clinical Data: Pneumothorax  PORTABLE CHEST - 1 VIEW  Comparison: 01/16/2013  Findings: Persistent right apical pneumothorax, lung apex projecting at the level of the right third rib posteriorly.  Chest tube remains in place.  Heart size normal.  Left lung clear. Coarse interstitial opacities at the right lung base slightly improved.  No effusion.  Atheromatous aorta.  IMPRESSION:  Stable right chest tube with slight decrease in size of right apical pneumothorax.   Original Report Authenticated By: D. Andria Rhein, MD    Scheduled Meds: . aspirin EC  81 mg Oral Daily  . atorvastatin  40 mg Oral q1800  . Chlorhexidine Gluconate Cloth  6 each Topical Q0600  . enoxaparin (LOVENOX) injection  40 mg Subcutaneous Q24H  . ezetimibe  10 mg Oral Daily  . [START ON 01/20/2013] famotidine  20 mg Oral Daily  . furosemide  20 mg Oral Daily  . [START ON 01/20/2013] levofloxacin (LEVAQUIN) IV  750 mg Intravenous Q48H  . levothyroxine  112 mcg Oral QAC breakfast  . lisinopril  40 mg Oral Daily  . metoprolol succinate  100 mg Oral  Daily  . mupirocin ointment  1 application Nasal BID  . sodium chloride  3 mL Intravenous Q12H   Continuous Infusions:   Principal Problem:   Pleural effusion on right Active Problems:   HTN (hypertension)   Hyperlipidemia   Osteoarthritis   GERD (gastroesophageal reflux disease)   CAD (coronary artery disease)   Hypothyroid   Anemia   Metacarpal bone fracture   Fall   Fx metacarpal   CAP (community acquired pneumonia)   Hydropneumothorax   Chronic systolic CHF (congestive heart failure)    Time spent: 40 minutes    [facility] M  [facility] Pager [phone]. If 7PM-7AM, please contact night-coverage at [email or web address], password [facility] 01/19/2013, 1:11 PM  LOS: 5 days   Attending: Patient seen and examined. Right pleural fluid cytology is unremarkable, no evidence of malignancy. Hopefully, the chest tube can be removed tomorrow. Consider cardiology consultation.

## 2013-01-19 NOTE — Progress Notes (Signed)
UR chart review completed.  

## 2013-01-19 NOTE — Progress Notes (Signed)
5occ of serosanguineous drainage noted this shift.

## 2013-01-20 ENCOUNTER — Inpatient Hospital Stay (HOSPITAL_COMMUNITY): Payer: Medicare Other

## 2013-01-20 LAB — CBC
HCT: 38.3 % (ref 36.0–46.0)
Hemoglobin: 13.2 g/dL (ref 12.0–15.0)
MCH: 33.7 pg (ref 26.0–34.0)
MCHC: 34.5 g/dL (ref 30.0–36.0)
MCV: 97.7 fL (ref 78.0–100.0)

## 2013-01-20 LAB — CULTURE, BLOOD (ROUTINE X 2): Culture: NO GROWTH

## 2013-01-20 MED ORDER — LEVOFLOXACIN 750 MG PO TABS
750.0000 mg | ORAL_TABLET | Freq: Every day | ORAL | Status: DC
  Administered 2013-01-20 – 2013-01-21 (×2): 750 mg via ORAL
  Filled 2013-01-20 (×2): qty 1

## 2013-01-20 NOTE — Discharge Summary (Signed)
[facility] PROGRESS NOTE  Allison Collier JYN:[id number] DOB: 1921-06-15 DOA: 01/14/2013 PCP: Rudi Heap, MD  Assessment/Plan: Pleural effusion on right  Possible late manifestation of a chronic condition : s/p thoracentesis that yielded 1700cc fluid. Chest xray improved with no suggestion infiltrate. Preliminary report indicates exudative process. Fluid anyalysis yields normal protien, LD 88, glucose 111. CT of the chest 01/16/13 indicated a dense right lower lobe consolidation. Effusion may be parapneumonic. Levaquin day #5  .  Hydropneumothorax. Patient was found to have a right hydropneumothorax 01/16/13 on CT scan. Interestingly, chest x-ray post thoracentesis did not indicate any evidence of pneumothorax. Patient was seen by general surgery and chest tube was placed. She continues to tolerate well. Surgery note indicates plan to remove tube today.  If chest xray in am ok will likely dc chest tube. Defer to surgery   Chronic systolic congestive heart failure. Ejection fraction was noted to be 30-35% on echocardiogram. Continue low-dose Lasix. She is already on aspirin, ACE inhibitor, beta blocker, statin. She's not having any chest pain. Volume status -5.1 Wt 59.5 from 61.4 on admission   Community acquired pneumonia. Possibly the cause of underlying pleural effusion. Patient is on Levaquin day #5. This was not evident on initial CT chest/chest x-ray on admission due to significant overlying pleural effusion.   HTN: improved control. Continue home meds and monitor.   Fx left fifth metacarpal: splint in dry and intact. Fingers warm to touch.   . Hypothyroid: TSH 10.1. Will check free T4 and T3. Will increase synthroid. Will need OP follow up.   . Hyperlipidemia : continue statin   . Osteoarthritis and gait instability : PT evaluation recommend SNF. Recurrent falls likely due to gait instability   . GERD (gastroesophageal reflux disease): stable at baseline.    Code Status:  full Family Communication: daughter at bedside.  Disposition Plan: likely ready in am for discharge. Would benefit from SNF. Discussed with family. Family discussing home with [facility] vs placement   Consultants:  Pulmonary  General surgery  Procedures:  Thoracentesis 01/15/13  Chest tube placement 01/16/13  Antibiotics:  Levaquin 01/16/13>>  HPI/Subjective: Sitting up in bed denies pain discomfort.   Objective: Filed Vitals:   01/20/13 0600 01/20/13 0601 01/20/13 0603 01/20/13 0945  BP: 160/80 160/63 160/60 126/56  Pulse: 72 71 70 79  Temp: 97.3 F (36.3 C) 97.4 F (36.3 C) 97.3 F (36.3 C) 97.5 F (36.4 C)  TempSrc: Oral Oral Oral Oral  Resp: 19 19 20 20   Height:      Weight: 59.5 kg (131 lb 2.8 oz)     SpO2: 99% 100% 99% 98%    Intake/Output Summary (Last 24 hours) at 01/20/13 1042 Last data filed at 01/20/13 0925  Gross per 24 hour  Intake    480 ml  Output   1450 ml  Net   -970 ml   Filed Weights   01/18/13 0433 01/19/13 0500 01/20/13 0600  Weight: 59.693 kg (131 lb 9.6 oz) 59.013 kg (130 lb 1.6 oz) 59.5 kg (131 lb 2.8 oz)    Exam:   General:  Thin frail NAD  Cardiovascular: RRR No MGR No LE edema  Respiratory: normal effort somewhat shallow. BS distant but clear bilaterally. No wheeze, no rhonchi. Right chest tube intact draining pink fluid  Abdomen: flat soft +BS non-tender to palpation  Musculoskeletal: no clubbing no cyanosis   Data Reviewed: Basic Metabolic Panel:  Recent Labs Lab 01/14/13 1611 01/15/13 0526 01/16/13 0800 01/18/13 0555 01/19/13  0444  NA 139 137 137 133* 135  K 4.4 5.0 4.5 4.2 4.3  CL 102 103 103 101 99  CO2 29 28 28 28 30   GLUCOSE 111* 83 89 88 85  BUN 18 18 15 14 15   CREATININE 0.89 0.91 0.79 0.79 0.95  CALCIUM 9.8 9.1 8.5 8.6 8.9   Liver Function Tests:  Recent Labs Lab 01/15/13 0530  AST 15  ALT 10  ALKPHOS 74  BILITOT 0.5  PROT 6.0  ALBUMIN 3.0*   No results found for this basename: LIPASE, AMYLASE,   in the last 168 hours No results found for this basename: AMMONIA,  in the last 168 hours CBC:  Recent Labs Lab 01/14/13 1611  01/16/13 0800 01/17/13 0637 01/18/13 0555 01/19/13 0444 01/20/13 0528  WBC 4.9  < > 3.5* 4.4 3.1* 3.7* 5.1  NEUTROABS 3.8  --   --   --   --   --   --   HGB 12.7  < > 11.3* 11.5* 10.7* 10.7* 13.2  HCT 38.9  < > 34.1* 34.8* 31.8* 31.8* 38.3  MCV 100.5*  < > 100.9* 99.7 99.1 99.4 97.7  PLT 137*  < > 100* 100* 116* 125* 176  < > = values in this interval not displayed. Cardiac Enzymes: No results found for this basename: CKTOTAL, CKMB, CKMBINDEX, TROPONINI,  in the last 168 hours BNP (last 3 results)  Recent Labs  01/15/13 0526  PROBNP 1699.0*   CBG: No results found for this basename: GLUCAP,  in the last 168 hours  Recent Results (from the past 240 hour(s))  MRSA PCR SCREENING     Status: Abnormal   Collection Time    01/15/13  8:44 AM      Result Value Range Status   MRSA by PCR POSITIVE (*) NEGATIVE Final   Comment:            The GeneXpert MRSA Assay (FDA     approved for NASAL specimens     only), is one component of a     comprehensive MRSA colonization     surveillance program. It is not     intended to diagnose MRSA     infection nor to guide or     monitor treatment for     MRSA infections.     RESULT CALLED TO, READ BACK BY AND VERIFIED WITH:     BUCKNER,J. AT 1548 ON 01/15/2013 BY BAUGHAM,M.  BODY FLUID CULTURE     Status: None   Collection Time    01/15/13 11:13 AM      Result Value Range Status   Specimen Description     Final   Value: FLUID RIGHT PLEURAL EFFUSION COLLECTED BY DOCTOR BOLES   Special Requests NONE   Final   Gram Stain     Final   Value: NO WBC SEEN     NO ORGANISMS SEEN   Culture NO GROWTH 3 DAYS   Final   Report Status 01/18/2013 FINAL   Final  CULTURE, BLOOD (ROUTINE X 2)     Status: None   Collection Time    01/15/13  7:45 PM      Result Value Range Status   Specimen Description BLOOD RIGHT  ANTECUBITAL   Final   Special Requests BOTTLES DRAWN AEROBIC AND ANAEROBIC 8CC   Final   Culture NO GROWTH 5 DAYS   Final   Report Status 01/20/2013 FINAL   Final  CULTURE, BLOOD (ROUTINE X 2)  Status: None   Collection Time    01/15/13  7:50 PM      Result Value Range Status   Specimen Description BLOOD RIGHT HAND   Final   Special Requests BOTTLES DRAWN AEROBIC AND ANAEROBIC 8CC   Final   Culture NO GROWTH 5 DAYS   Final   Report Status 01/20/2013 FINAL   Final     Studies: Dg Chest Port 1 View  01/20/2013   *RADIOLOGY REPORT*  Clinical Data: Right pneumothorax, follow-up  PORTABLE CHEST - 1 VIEW  Comparison: Portable chest x-ray of 01/19/2013  Findings: There has been slight further decrease in volume of the right pneumothorax with right chest tube remaining.  Right basilar atelectasis and a small right pleural effusion remains. The left lung is clear.  Heart size is stable.  IMPRESSION: Further slight decrease in the right hydropneumothorax with right chest tube remaining.   Original Report Authenticated By: Dwyane Dee, M.D.   Dg Chest Port 1 View  01/19/2013   *RADIOLOGY REPORT*  Clinical Data: Right-sided pneumothorax.  PORTABLE CHEST - 1 VIEW  Comparison: 01/18/2013.  Findings: A small right apical pneumothorax is decreased slightly since the prior exam.  Right basilar airspace disease likely reflects atelectasis.  A small right pleural effusion is suspected.  The heart size is normal.  The left lung is clear.  The visualized soft tissues and bony thorax are unremarkable. The right-sided chest tube remains in place.  Patient is slightly rotated.  The tube is not significantly changed.  IMPRESSION:  1.  Stable to slight decrease and a small right apical pneumothorax. 2.  Chest tube position is unchanged. 3.  Suspect small right pleural effusion and minimal right basilar atelectasis.   Original Report Authenticated By: Marin Roberts, M.D.    Scheduled Meds: . aspirin EC  81 mg Oral  Daily  . atorvastatin  40 mg Oral q1800  . enoxaparin (LOVENOX) injection  40 mg Subcutaneous Q24H  . ezetimibe  10 mg Oral Daily  . famotidine  20 mg Oral Daily  . furosemide  20 mg Oral Daily  . levofloxacin (LEVAQUIN) IV  750 mg Intravenous Q48H  . levothyroxine  112 mcg Oral QAC breakfast  . lisinopril  40 mg Oral Daily  . metoprolol succinate  100 mg Oral Daily  . sodium chloride  3 mL Intravenous Q12H   Continuous Infusions:   Principal Problem:   Pleural effusion on right Active Problems:   HTN (hypertension)   Hyperlipidemia   Osteoarthritis   GERD (gastroesophageal reflux disease)   CAD (coronary artery disease)   Hypothyroid   Anemia   Metacarpal bone fracture   Fall   Fx metacarpal   CAP (community acquired pneumonia)   Hydropneumothorax   Chronic systolic CHF (congestive heart failure)    Time spent: 30 minutes    [facility] M  [facility] Pager [phone]. If 7PM-7AM, please contact night-coverage at [email or web address], password [facility] 01/20/2013, 10:42 AM  LOS: 6 days   Attending: Patient seen and examined. Further recommendations from surgery are awaited regarding removal of chest tube. She will need followup with cardiology in regard to her low ejection fraction, this can be done as an outpatient. She clearly cannot go home and will require skilled nursing facility for rehabilitation. I have expressed this to the patient's daughter and patient.

## 2013-01-20 NOTE — Progress Notes (Signed)
Subjective: NO symptoms, no SOB.  NO CP.  Objective: Vital signs in last 24 hours: Temp:  [97 F (36.1 C)-98.2 F (36.8 C)] 97.3 F (36.3 C) (06/03 2020) Pulse Rate:  [69-85] 69 (06/03 2020) Resp:  [18-20] 18 (06/03 2020) BP: (111-160)/(55-80) 143/69 mmHg (06/03 2020) SpO2:  [96 %-100 %] 97 % (06/03 2020) Weight:  [59.5 kg (131 lb 2.8 oz)] 59.5 kg (131 lb 2.8 oz) (06/03 0600) Last BM Date: 01/16/13  Intake/Output from previous day: 06/02 0701 - 06/03 0700 In: 480 [P.O.:480] Out: 1450 [Urine:1450] Intake/Output this shift:    General appearance: alert and no distress Chest wall: no tenderness, CT withour airleak, to H2O seal.    Lab Results:   Recent Labs  01/19/13 0444 01/20/13 0528  WBC 3.7* 5.1  HGB 10.7* 13.2  HCT 31.8* 38.3  PLT 125* 176   BMET  Recent Labs  01/18/13 0555 01/19/13 0444  NA 133* 135  K 4.2 4.3  CL 101 99  CO2 28 30  GLUCOSE 88 85  BUN 14 15  CREATININE 0.79 0.95  CALCIUM 8.6 8.9   PT/INR No results found for this basename: LABPROT, INR,  in the last 72 hours ABG No results found for this basename: PHART, PCO2, PO2, HCO3,  in the last 72 hours  Studies/Results: Dg Chest Port 1 View  01/20/2013   *RADIOLOGY REPORT*  Clinical Data: Right pneumothorax, follow-up  PORTABLE CHEST - 1 VIEW  Comparison: Portable chest x-ray of 01/19/2013  Findings: There has been slight further decrease in volume of the right pneumothorax with right chest tube remaining.  Right basilar atelectasis and a small right pleural effusion remains. The left lung is clear.  Heart size is stable.  IMPRESSION: Further slight decrease in the right hydropneumothorax with right chest tube remaining.   Original Report Authenticated By: Dwyane Dee, M.D.   Dg Chest Port 1 View  01/19/2013   *RADIOLOGY REPORT*  Clinical Data: Right-sided pneumothorax.  PORTABLE CHEST - 1 VIEW  Comparison: 01/18/2013.  Findings: A small right apical pneumothorax is decreased slightly since the  prior exam.  Right basilar airspace disease likely reflects atelectasis.  A small right pleural effusion is suspected.  The heart size is normal.  The left lung is clear.  The visualized soft tissues and bony thorax are unremarkable. The right-sided chest tube remains in place.  Patient is slightly rotated.  The tube is not significantly changed.  IMPRESSION:  1.  Stable to slight decrease and a small right apical pneumothorax. 2.  Chest tube position is unchanged. 3.  Suspect small right pleural effusion and minimal right basilar atelectasis.   Original Report Authenticated By: Marin Roberts, M.D.    Anti-infectives: Anti-infectives   Start     Dose/Rate Route Frequency Ordered Stop   01/20/13 1700  levofloxacin (LEVAQUIN) IVPB 750 mg  Status:  Discontinued     750 mg 100 mL/hr over 90 Minutes Intravenous Every 48 hours 01/19/13 1122 01/20/13 1051   01/20/13 1100  levofloxacin (LEVAQUIN) tablet 750 mg     750 mg Oral Daily 01/20/13 1051     01/16/13 1700  levofloxacin (LEVAQUIN) IVPB 750 mg  Status:  Discontinued     750 mg 100 mL/hr over 90 Minutes Intravenous Every 24 hours 01/16/13 1611 01/19/13 1122      Assessment/Plan: s/p * No surgery found * Right PTX, effusion:  resolving.  CT removed without incident.  Tolerated well.  Continue occlusive dressing 24 hours.  Available as  needed.    LOS: 6 days    Valta Dillon C 01/20/2013

## 2013-01-20 NOTE — Clinical Social Work Note (Signed)
Pt's family now feel they will be unable to manage pt at home and want to reconsider SNF. CSW updated FL2 and refaxed. Presented bed offers to pt and daughter, Olegario Messier. They choose [facility]. Facility notified. Anticipate d/c tomorrow.  Derenda Fennel, [place] [phone]

## 2013-01-20 NOTE — Progress Notes (Signed)
Pt had a 10 beat run of Vtach, vitals were taken and pt is stable. Paged Dr. Orvan Falconer, no new orders. Will continue to monitor pt.

## 2013-01-20 NOTE — Clinical Social Work Placement (Signed)
Clinical Social Work Department CLINICAL SOCIAL WORK PLACEMENT NOTE 01/20/2013  Patient:  Allison Collier, Allison Collier  Account Number:  [id number] Admit date:  01/14/2013  Clinical Social Worker:  Derenda Fennel, LCSW  Date/time:  01/15/2013 03:30 PM  Clinical Social Work is seeking post-discharge placement for this patient at the following level of care:   SKILLED NURSING   (*CSW will update this form in Epic as items are completed)   01/15/2013  Patient/family provided with [facility] Department of Clinical Social Work's list of facilities offering this level of care within the geographic area requested by the patient (or if unable, by the patient's family).  01/15/2013  Patient/family informed of their freedom to choose among providers that offer the needed level of care, that participate in Medicare, Medicaid or managed care program needed by the patient, have an available bed and are willing to accept the patient.  01/15/2013  Patient/family informed of [facility]' ownership interest in [facility], as well as of the fact that they are under no obligation to receive care at this facility.  PASARR submitted to EDS on  PASARR number received from EDS on   FL2 transmitted to all facilities in geographic area requested by pt/family on  01/15/2013 FL2 transmitted to all facilities within larger geographic area on   Patient informed that his/her managed care company has contracts with or will negotiate with  certain facilities, including the following:     Patient/family informed of bed offers received:  01/20/2013 Patient chooses bed at [facility] Physician recommends and patient chooses bed at    Patient to be transferred to  on   Patient to be transferred to facility by   The following physician request were entered in Epic:   Additional Comments: Pt has existing pasarr.  Derenda Fennel, [place] [phone]

## 2013-01-21 ENCOUNTER — Inpatient Hospital Stay (HOSPITAL_COMMUNITY): Payer: Medicare Other

## 2013-01-21 MED ORDER — LEVOFLOXACIN 750 MG PO TABS: 750.0000 mg | ORAL_TABLET | Freq: Every day | ORAL | Status: DC

## 2013-01-21 MED ORDER — FUROSEMIDE 20 MG PO TABS: 20.0000 mg | ORAL_TABLET | Freq: Every day | ORAL | Status: DC

## 2013-01-21 NOTE — Progress Notes (Signed)
Discharge packet given to patient's daughter, d/c to [facility], out in stable condition via w/c with staff and daughter, to be transported via private vehicle, reported to nurse at [facility].

## 2013-01-21 NOTE — Clinical Social Work Placement (Signed)
Clinical Social Work Department CLINICAL SOCIAL WORK PLACEMENT NOTE 01/21/2013  Patient:  Allison Collier, Allison Collier  Account Number:  [id number] Admit date:  01/14/2013  Clinical Social Worker:  Derenda Fennel, LCSW Date/time:  01/15/2013 03:30 PM  Clinical Social Work is seeking post-discharge placement for this patient at the following level of care:   SKILLED NURSING   (*CSW will update this form in Epic as items are completed)   01/15/2013  Patient/family provided with [facility] Department of Clinical Social Work's list of facilities offering this level of care within the geographic area requested by the patient (or if unable, by the patient's family).  01/15/2013  Patient/family informed of their freedom to choose among providers that offer the needed level of care, that participate in Medicare, Medicaid or managed care program needed by the patient, have an available bed and are willing to accept the patient.  01/15/2013  Patient/family informed of [facility]' ownership interest in [facility], as well as of the fact that they are under no obligation to receive care at this facility.  PASARR submitted to [employer] on  PASARR number received from [employer] on   FL2 transmitted to all facilities in geographic area requested by pt/family on  01/15/2013 FL2 transmitted to all facilities within larger geographic area on   Patient informed that his/her managed care company has contracts with or will negotiate with  certain facilities, including the following:     Patient/family informed of bed offers received:  01/20/2013 Patient chooses bed at [facility] Physician recommends and patient chooses bed at    Patient to be transferred to [facility] on  01/21/2013 Patient to be transferred to facility by family  The following physician request were entered in Epic:   Additional Comments: Pt has existing pasarr.  Derenda Fennel, [place] [phone]

## 2013-01-21 NOTE — Clinical Social Work Note (Signed)
Pt d/c today to [facility]. Pt, pt's daughter, and facility aware and agreeable. Family requesting to provide transportation. D/C summary and FL2 faxed.  Derenda Fennel, [place] [phone]

## 2013-01-21 NOTE — Discharge Summary (Signed)
Physician Discharge Summary  Allison Collier ZOX:[id number] DOB: 1920-10-13 DOA: 01/14/2013  PCP: Rudi Heap, MD  Admit date: 01/14/2013 Discharge date: 01/21/2013  Time spent: 40 minutes  Recommendations for Outpatient Follow-up:  Follow up with PCP 1 week for evaluation of symptoms and possible referral to cardiologist for evaluation of low ejection fraction. Evaluation of thyroid function  Follow up with Dr. Leticia Penna 1 week for evaluation of pneumothorax  Follow up with cardiologist in 1-3 weeks to evaluate echo results i.e. Low ejection fraction.  Discharge Diagnoses:  Principal Problem:   Pleural effusion on right Active Problems:   HTN (hypertension)   Hyperlipidemia   Osteoarthritis   GERD (gastroesophageal reflux disease)   CAD (coronary artery disease)   Hypothyroid   Anemia   Metacarpal bone fracture   Fall   Fx metacarpal   CAP (community acquired pneumonia)   Hydropneumothorax   Chronic systolic CHF (congestive heart failure)   Discharge Condition: stable  Diet recommendation: carb modified  Filed Weights   01/19/13 0500 01/20/13 0600 01/21/13 0636  Weight: 59.013 kg (130 lb 1.6 oz) 59.5 kg (131 lb 2.8 oz) 57.017 kg (125 lb 11.2 oz)    History of present illness:  Allison Collier is an 79 y.o. female. Elderly Caucasian lady with osteoarthritis usually mobile with a walker, had been feeling nauseous for the previous couple of days prior to admission on 01/14/13 when she got up to go to the bathroom because of  nausea, did not use a walker or a cane, and her daughter heard her fall in the bathroom. She denied any loss of consciousness she denied hitting her head. She was transported to the emergency room and was extensively evaluated with x-rays; other than fractures of the left hand no acute abnormality was found. However chest CT revealed a large right pleural effusion and the hospitalist service was called to assist. On further review patient and her daughter  who was present felt that she has been coughing for a few days and possible weeks, but the cough is nonproductive. They dened history of heart failure. She was treated for pneumonia some months ago; she did have an outbreak of shingles 2 months ago which is normal almost completely healed. She had actually had a recurrent falls due to failure to use a walker at home.    Hospital Course:  Pleural effusion on right  Possible late manifestation of a chronic condition : s/p thoracentesis that yielded 1700cc fluid. Cytology unremarkable with no evidence of malignancy. Report indicates exudative process.  Effusion may be parapneumonic. Pt received 6 days levaquin and will be discharged with 4 more days for total of 10 days.   .  Hydropneumothorax. Patient was found to have a right hydropneumothorax 01/16/13 on CT scan. Interestingly, chest x-ray post thoracentesis did not indicate any evidence of pneumothorax. Patient was seen by general surgery and chest tube was placed. She continues to tolerated well. Chest tube removed 01/20/13. Post chest xray yields slight increase in pneumothorax since chest tube removal. Pt not distressed, oxygen saturation level 94-99% on 2L. Recommend OP follow up with Dr. Leticia Penna 1 week.    Chronic systolic congestive heart failure. Ejection fraction was noted to be 30-35% on echocardiogram. Continue low-dose Lasix. She is already on aspirin, ACE inhibitor, beta blocker, statin. She did not having any chest pain during hospitalization. Volume status -5.4 Wt 57.5kg from 61.4kg on admission   Community acquired pneumonia. Possibly the cause of underlying pleural effusion. Patient is on  Levaquin. This was not evident on initial CT chest/chest x-ray on admission due to significant overlying pleural effusion.   HTN: controlled. Continue home meds.   Fx left fifth metacarpal: splint in dry and intact. Fingers warm to touch. Follow up xray 2-3 weeks with PCP   . Hypothyroid: TSH 10.1.  Will check free T4 and T3. Will increase synthroid. Will need OP follow up.   . Hyperlipidemia : continue statin   . Osteoarthritis and gait instability : PT evaluation recommend SNF. Recurrent falls likely due to gait instability   . GERD (gastroesophageal reflux disease): stable at baseline.  Procedures:  Thoracentesis 01/15/13>>  Chest tube placement 01/16/13  Chest tube removal 01/20/13  Consultations:  Pulmonary  General surger  Discharge Exam: Filed Vitals:   01/20/13 2317 01/21/13 0629 01/21/13 0630 01/21/13 0636  BP:  125/73 104/64 94/61  Pulse:  85    Temp:  97.4 F (36.3 C)    TempSrc:  Oral    Resp:  20    Height:      Weight:    57.017 kg (125 lb 11.2 oz)  SpO2: 97% 99% 95% 97%    General: awake alert somewhat frail Cardiovascular: RRR No MGR No LE edema Respiratory: normal effort BS distant but clear. No wheeze no rhonchi Musculoskeletal: : splint to left hand dry and intact. Fingers warm to touch  Discharge Instructions   Future Appointments Provider Department Dept Phone   03/10/2013 11:20 AM Ileana Ladd, MD [facility] [phone]       Medication List    TAKE these medications       acetaminophen-codeine 300-30 MG per tablet  Commonly known as:  TYLENOL #3  Take 1 tablet by mouth every 12 (twelve) hours as needed. with food     aspirin EC 81 MG tablet  Take 81 mg by mouth daily.     ezetimibe 10 MG tablet  Commonly known as:  ZETIA  Take 10 mg by mouth daily.     fish oil-omega-3 fatty acids 1000 MG capsule  Take 2 g by mouth daily.     furosemide 20 MG tablet  Commonly known as:  LASIX  Take 1 tablet (20 mg total) by mouth daily.     levofloxacin 750 MG tablet  Commonly known as:  LEVAQUIN  Take 1 tablet (750 mg total) by mouth daily.     levothyroxine 100 MCG tablet  Commonly known as:  SYNTHROID, LEVOTHROID  Take 100 mcg by mouth daily.     lisinopril 40 MG tablet  Commonly known as:   PRINIVIL,ZESTRIL  Take 40 mg by mouth daily.     metoprolol succinate 100 MG 24 hr tablet  Commonly known as:  TOPROL-XL  Take 100 mg by mouth daily.     nitroGLYCERIN 0.4 MG SL tablet  Commonly known as:  NITROSTAT  Place 0.4 mg under the tongue every 5 (five) minutes as needed. For chest pains     ranitidine 150 MG tablet  Commonly known as:  ZANTAC  Take 150 mg by mouth daily.     simvastatin 80 MG tablet  Commonly known as:  ZOCOR  Take 80 mg by mouth at bedtime.       Allergies  Allergen Reactions  . Sulfa Antibiotics     Reaction unknown      The results of significant diagnostics from this hospitalization (including imaging, microbiology, ancillary and laboratory) are listed below for reference.    Significant  Diagnostic Studies: Dg Chest 1 View  01/15/2013   *RADIOLOGY REPORT*  Clinical Data: Right pleural effusion post thoracentesis  CHEST - 1 VIEW  Comparison: Chest CT 01/14/2013  Findings: Normal heart size and pulmonary vascularity. Tortuous aorta with atherosclerotic calcification. Marked decrease in right pleural effusion with minimal residual pleural effusion noted at right costophrenic angle. No pneumothorax. Minimal residual right basilar atelectasis. Lungs otherwise clear with question underlying emphysematous changes. Bones demineralized. No pneumothorax.  IMPRESSION: Marked decrease in right pleural effusion and basilar atelectasis post thoracentesis. No pneumothorax.   Original Report Authenticated By: Ulyses Southward, M.D.   Dg Hip Bilateral W/pelvis  01/14/2013   *RADIOLOGY REPORT*  Clinical Data: Larey Seat today, history of prior left hip fracture and fixation  BILATERAL HIP WITH PELVIS - 4+ VIEW  Comparison: Left hip films of 03/06/2012  Findings: An intramedullary pin is noted with a screw through the prior left intertrochanteric femoral fracture for fixation.  No acute complication is seen and no evidence of prosthetic motion is noted.  IMPRESSION:   Stable  appearance of prior fixation of left intertrochanteric femoral fracture.   Original Report Authenticated By: Dwyane Dee, M.D.   Ct Head Wo Contrast  01/14/2013   *RADIOLOGY REPORT*  Clinical Data:  Weakness, sore throat.  Esophageal cancer  CT HEAD WITHOUT CONTRAST CT CERVICAL SPINE WITHOUT CONTRAST  Technique:  Multidetector CT imaging of the head and cervical spine was performed following the standard protocol without intravenous contrast.  Multiplanar CT image reconstructions of the cervical spine were also generated.  Comparison:  head CT 08/30/2011  CT HEAD  Findings: No acute intracranial hemorrhage.  No focal mass lesion. No CT evidence of acute infarction.   No midline shift or mass effect.  No hydrocephalus.  Basilar cisterns are patent.  Periventricular white matter hypodensities unchanged from prior. Stable atrophy.  Paranasal sinuses and mastoid air cells are clear.  Orbits are normal.  IMPRESSION:  1.  No acute intracranial findings.  2.  Stable atrophy and microvascular disease.  CT CERVICAL SPINE  Findings: No prevertebral soft tissue swelling.  Normal alignment of cervical vertebral bodies.  No loss of vertebral body height. Normal facet articulation.  Normal craniocervical junction.  No evidence epidural or paraspinal hematoma.  There is multilevel endplate osteophytosis.  Small lucent lesions within the cervical vertebral bodies are felt to relate to osteoporosis.  Limited view of the upper thorax demonstrates a large right pleural effusion.  IMPRESSION:  1.  No acute findings of the cervical spine.  2.  Multilevel disc osteophytic disease. 3.  Large right pleural effusion.   Original Report Authenticated By: Genevive Bi, M.D.   Ct Chest W Contrast  01/16/2013   *RADIOLOGY REPORT*  Clinical Data: Evaluate pleural effusion  CT CHEST WITH CONTRAST  Technique:  Multidetector CT imaging of the chest was performed following the standard protocol during bolus administration of intravenous  contrast.  Contrast: 80mL OMNIPAQUE IOHEXOL 300 MG/ML  SOLN .  Comparison: 01/14/2013.  Findings: There is a moderate size, right-sided hydro pneumothorax identified. There is airspace consolidation noted in the right lower lobe.  Again noted is an area of parenchymal scarring in the posterior aspect of the left upper lobe.  Mild diffuse bronchial wall thickening noted.  The ascending aorta is ectatic measuring 4 cm in diameter, image 32/series 2.  This is unchanged from previous exam.  Scattered atherosclerotic vascular calcifications are noted.  No pericardial effusion.  No enlarged mediastinal or hilar lymph nodes.  There is  no supraclavicular or axillary adenopathy. The bones are osteopenic. Compression deformity involving the T7 vertebra is unchanged from previous exam.  No new fractures.  Limited imaging through the upper abdomen shows a large gallstone. No acute findings identified.  Small cysts are noted within the upper poles of both kidneys.  The adrenal glands appear normal.  IMPRESSION:  1. Moderate right-sided hydropneumothorax. 2.  COPD. 3.  The similar ectasia of the ascending aorta. 4.  Stable age indeterminate T7 compression fracture.  Critical Value/emergent results were called by telephone at the time of interpretation on 01/16/2013 at 12:10 p.m.  to Dr. Juanetta Gosling, who verbally acknowledged these results.   Original Report Authenticated By: Signa Kell, M.D.   Ct Chest W Contrast  01/14/2013   *RADIOLOGY REPORT*  Clinical Data: Fall, history of esophageal cancer  CT CHEST WITH CONTRAST  Technique:  Multidetector CT imaging of the chest was performed following the standard protocol during bolus administration of intravenous contrast.  Contrast: 80mL OMNIPAQUE IOHEXOL 300 MG/ML  SOLN  Comparison: With the prior chest x-ray 12/16/2011  Findings:  Mediastinum: Unremarkable CT appearance of the thyroid gland.  No suspicious mediastinal or hilar adenopathy.  No soft tissue mediastinal mass.  The  thoracic esophagus is unremarkable.  Heart/Vascular: Ectatic bordering on aneurysmally dilated ascending thoracic aorta.  The ascending thoracic aorta measures 4 cm in diameter.  The transverse aorta measures approximately 3 cm in diameter and the descending thoracic aorta 2.4 cm in diameter. There are scattered atherosclerotic vascular calcifications without significant stenosis.  The great vessels are tortuous proximally. Calcifications are noted throughout the coronary arteries. Suggestion of thickening of the aortic valve.  Mild left ventricular dilatation.  Otherwise, the heart is within normal limits for size.  No significant pericardial effusion.  Lungs/Pleura: Large layering right pleural effusion there is associated passive atelectasis of the right lower lobe. Parenchymal scarring in the posterior aspect of the left upper lobe.  Mild diffuse bronchial wall thickening.  Upper Abdomen: Large calcified gallstone within the incompletely imaged gallbladder.  Bilateral renal cysts.  Diffuse calcified splenic artery and origins of the celiac and mesenteric arteries.  Bones: No acute fracture or aggressive appearing lytic or blastic osseous lesion.  Age indeterminate T7 compression fracture with approximately 50% height loss anteriorly.  No significant posterior bony retropulsion.  IMPRESSION:  1.  Large right layering pleural effusion with associated right lower lobe atelectasis. 2.  Ectatic bordering on aneurysmally dilated ascending thoracic aorta.  Maximal dimension is 4.0 cm 3.  Atherosclerosis including coronary artery disease 4.  Diffuse bilateral bronchial wall thickening suggests underlying chronic bronchitis/COPD 5.  Age indeterminate T7 compression fracture with approximately 50% height loss anteriorly.  A remote/subacute process is favored. Given history of acute fall, recommend clinical correlation for point tenderness and pain in this region.   Original Report Authenticated By: Malachy Moan, M.D.    Ct Cervical Spine Wo Contrast  01/14/2013   *RADIOLOGY REPORT*  Clinical Data:  Weakness, sore throat.  Esophageal cancer  CT HEAD WITHOUT CONTRAST CT CERVICAL SPINE WITHOUT CONTRAST  Technique:  Multidetector CT imaging of the head and cervical spine was performed following the standard protocol without intravenous contrast.  Multiplanar CT image reconstructions of the cervical spine were also generated.  Comparison:  head CT 08/30/2011  CT HEAD  Findings: No acute intracranial hemorrhage.  No focal mass lesion. No CT evidence of acute infarction.   No midline shift or mass effect.  No hydrocephalus.  Basilar cisterns are patent.  Periventricular white matter hypodensities unchanged from prior. Stable atrophy.  Paranasal sinuses and mastoid air cells are clear.  Orbits are normal.  IMPRESSION:  1.  No acute intracranial findings.  2.  Stable atrophy and microvascular disease.  CT CERVICAL SPINE  Findings: No prevertebral soft tissue swelling.  Normal alignment of cervical vertebral bodies.  No loss of vertebral body height. Normal facet articulation.  Normal craniocervical junction.  No evidence epidural or paraspinal hematoma.  There is multilevel endplate osteophytosis.  Small lucent lesions within the cervical vertebral bodies are felt to relate to osteoporosis.  Limited view of the upper thorax demonstrates a large right pleural effusion.  IMPRESSION:  1.  No acute findings of the cervical spine.  2.  Multilevel disc osteophytic disease. 3.  Large right pleural effusion.   Original Report Authenticated By: Genevive Bi, M.D.   Dg Chest Port 1 View  01/20/2013   *RADIOLOGY REPORT*  Clinical Data: Right pneumothorax, follow-up  PORTABLE CHEST - 1 VIEW  Comparison: Portable chest x-ray of 01/19/2013  Findings: There has been slight further decrease in volume of the right pneumothorax with right chest tube remaining.  Right basilar atelectasis and a small right pleural effusion remains. The left lung is  clear.  Heart size is stable.  IMPRESSION: Further slight decrease in the right hydropneumothorax with right chest tube remaining.   Original Report Authenticated By: Dwyane Dee, M.D.   Dg Chest Port 1 View  01/19/2013   *RADIOLOGY REPORT*  Clinical Data: Right-sided pneumothorax.  PORTABLE CHEST - 1 VIEW  Comparison: 01/18/2013.  Findings: A small right apical pneumothorax is decreased slightly since the prior exam.  Right basilar airspace disease likely reflects atelectasis.  A small right pleural effusion is suspected.  The heart size is normal.  The left lung is clear.  The visualized soft tissues and bony thorax are unremarkable. The right-sided chest tube remains in place.  Patient is slightly rotated.  The tube is not significantly changed.  IMPRESSION:  1.  Stable to slight decrease and a small right apical pneumothorax. 2.  Chest tube position is unchanged. 3.  Suspect small right pleural effusion and minimal right basilar atelectasis.   Original Report Authenticated By: Marin Roberts, M.D.   Dg Chest Port 1 View  01/18/2013   *RADIOLOGY REPORT*  Clinical Data: Pneumothorax  PORTABLE CHEST - 1 VIEW  Comparison: 01/16/2013  Findings: Persistent right apical pneumothorax, lung apex projecting at the level of the right third rib posteriorly.  Chest tube remains in place.  Heart size normal.  Left lung clear. Coarse interstitial opacities at the right lung base slightly improved.  No effusion.  Atheromatous aorta.  IMPRESSION:  Stable right chest tube with slight decrease in size of right apical pneumothorax.   Original Report Authenticated By: D. Andria Rhein, MD   Dg Chest Port 1 View  01/16/2013   *RADIOLOGY REPORT*  Clinical Data: Right chest tube placement for pneumothorax.  PORTABLE CHEST - 1 VIEW 01/16/2013 1558 hours:  Comparison: CT chest earlier same date 1011 hours.  Portable chest x-ray yesterday.  Findings: Right chest tube in place with a persistent right apical pneumothorax on the  order of 15-20% or so, probably not significantly changed since the CT earlier today.  Right pleural effusion, unchanged.  Patchy airspace opacities in the right lower lobe, unchanged.  Left lung remains clear.  Cardiac silhouette normal in size for technique.  IMPRESSION: Right chest tube with residual approximate 15-20% or so right apical pneumothorax.  Moderate sized right pleural effusion. Patchy pneumonia in the right lower lobe.   Original Report Authenticated By: Hulan Saas, M.D.   Dg Hand Complete Left  01/14/2013   *RADIOLOGY REPORT*  Clinical Data: Larey Seat today with hand pain and bruising  LEFT HAND - COMPLETE 3+ VIEW  Comparison: None.  Findings: There is an acute angulated fracture of the distal aspect of the left fifth metacarpal with soft tissue swelling.  No other acute abnormality is seen.  IMPRESSION: Acute angulated fracture of the distal left fifth metacarpal.   Original Report Authenticated By: Dwyane Dee, M.D.   US Thoracentesis Asp Pleural Space W/img Guide  01/15/2013   *RADIOLOGY REPORT*  CLINICAL DATA: Right pleural effusion of unknown etiology, shortness of breath  ULTRASOUND GUIDED RIGHT THORACENTESIS:  Technique: After explanation of procedure and risks, written informed consent for thoracentesis obtained. Time out protocol followed. Right pleural effusion localized by ultrasound. Site at posterior rightchest prepped and draped in usual sterile fashion. Skin and chest wall anesthetized with 5 ml of 1% lidocaine. Standard 8-French thoracentesis catheter placed into right pleural space. 1700  ml of dark and there are colored fluid aspirated by syringe pump. Procedure tolerated very well by patient without immediate complication. Post procedure chest radiograph obtained.  IMPRESSION: Ultrasound-guided right thoracentesis with removal of 1700 ml of pleural fluid. Fluid sent to laboratory for requested analysis.   Original Report Authenticated By: Ulyses Southward, M.D.     Microbiology: Recent Results (from the past 240 hour(s))  MRSA PCR SCREENING     Status: Abnormal   Collection Time    01/15/13  8:44 AM      Result Value Range Status   MRSA by PCR POSITIVE (*) NEGATIVE Final   Comment:            The GeneXpert MRSA Assay (FDA     approved for NASAL specimens     only), is one component of a     comprehensive MRSA colonization     surveillance program. It is not     intended to diagnose MRSA     infection nor to guide or     monitor treatment for     MRSA infections.     RESULT CALLED TO, READ BACK BY AND VERIFIED WITH:     BUCKNER,J. AT 1548 ON 01/15/2013 BY BAUGHAM,M.  BODY FLUID CULTURE     Status: None   Collection Time    01/15/13 11:13 AM      Result Value Range Status   Specimen Description     Final   Value: FLUID RIGHT PLEURAL EFFUSION COLLECTED BY DOCTOR BOLES   Special Requests NONE   Final   Gram Stain     Final   Value: NO WBC SEEN     NO ORGANISMS SEEN   Culture NO GROWTH 3 DAYS   Final   Report Status 01/18/2013 FINAL   Final  CULTURE, BLOOD (ROUTINE X 2)     Status: None   Collection Time    01/15/13  7:45 PM      Result Value Range Status   Specimen Description BLOOD RIGHT ANTECUBITAL   Final   Special Requests BOTTLES DRAWN AEROBIC AND ANAEROBIC 8CC   Final   Culture NO GROWTH 5 DAYS   Final   Report Status 01/20/2013 FINAL   Final  CULTURE, BLOOD (ROUTINE X 2)     Status: None   Collection Time    01/15/13  7:50 PM  Result Value Range Status   Specimen Description BLOOD RIGHT HAND   Final   Special Requests BOTTLES DRAWN AEROBIC AND ANAEROBIC 8CC   Final   Culture NO GROWTH 5 DAYS   Final   Report Status 01/20/2013 FINAL   Final     Labs: Basic Metabolic Panel:  Recent Labs Lab 01/14/13 1611 01/15/13 0526 01/16/13 0800 01/18/13 0555 01/19/13 0444  NA 139 137 137 133* 135  K 4.4 5.0 4.5 4.2 4.3  CL 102 103 103 101 99  CO2 29 28 28 28 30   GLUCOSE 111* 83 89 88 85  BUN 18 18 15 14 15    CREATININE 0.89 0.91 0.79 0.79 0.95  CALCIUM 9.8 9.1 8.5 8.6 8.9   Liver Function Tests:  Recent Labs Lab 01/15/13 0530  AST 15  ALT 10  ALKPHOS 74  BILITOT 0.5  PROT 6.0  ALBUMIN 3.0*   No results found for this basename: LIPASE, AMYLASE,  in the last 168 hours No results found for this basename: AMMONIA,  in the last 168 hours CBC:  Recent Labs Lab 01/14/13 1611  01/16/13 0800 01/17/13 0637 01/18/13 0555 01/19/13 0444 01/20/13 0528  WBC 4.9  < > 3.5* 4.4 3.1* 3.7* 5.1  NEUTROABS 3.8  --   --   --   --   --   --   HGB 12.7  < > 11.3* 11.5* 10.7* 10.7* 13.2  HCT 38.9  < > 34.1* 34.8* 31.8* 31.8* 38.3  MCV 100.5*  < > 100.9* 99.7 99.1 99.4 97.7  PLT 137*  < > 100* 100* 116* 125* 176  < > = values in this interval not displayed. Cardiac Enzymes: No results found for this basename: CKTOTAL, CKMB, CKMBINDEX, TROPONINI,  in the last 168 hours BNP: BNP (last 3 results)  Recent Labs  01/15/13 0526  PROBNP 1699.0*   CBG: No results found for this basename: GLUCAP,  in the last 168 hours     Signed:  Gwenyth Bender  [facility] 01/21/2013, 10:29 AM Attending: Patient seen and examined. Above note reviewed. She is medically stable for discharge. She will need followup with Dr. Leticia Penna to followup with a pneumothorax and also cardiology for further evaluation of reduced ejection fraction.

## 2013-01-27 ENCOUNTER — Ambulatory Visit: Payer: Medicare Other | Admitting: Family Medicine

## 2013-02-27 ENCOUNTER — Ambulatory Visit: Payer: Medicare Other | Admitting: Family Medicine

## 2013-03-10 ENCOUNTER — Ambulatory Visit: Payer: Self-pay | Admitting: Family Medicine

## 2013-04-15 ENCOUNTER — Ambulatory Visit (INDEPENDENT_AMBULATORY_CARE_PROVIDER_SITE_OTHER): Payer: Medicare Other | Admitting: Cardiology

## 2013-04-15 ENCOUNTER — Encounter: Payer: Self-pay | Admitting: Cardiology

## 2013-04-15 VITALS — BP 140/70 | HR 83 | Ht 67.0 in | Wt 132.0 lb

## 2013-04-15 DIAGNOSIS — I2510 Atherosclerotic heart disease of native coronary artery without angina pectoris: Secondary | ICD-10-CM

## 2013-04-15 DIAGNOSIS — I509 Heart failure, unspecified: Secondary | ICD-10-CM

## 2013-04-15 DIAGNOSIS — I5022 Chronic systolic (congestive) heart failure: Secondary | ICD-10-CM

## 2013-04-15 MED ORDER — OMEPRAZOLE 20 MG PO CPDR: 20.0000 mg | DELAYED_RELEASE_CAPSULE | Freq: Every day | ORAL | Status: DC

## 2013-04-15 NOTE — Progress Notes (Signed)
HPI The patient presents for evaluation apparently of rapid heart rate. She does have a distant history of coronary disease though I don't have these records. I do see that she has an ejection fraction of 30%. She was hospitalized earlier this year with community-acquired pneumonia and a parapneumonic effusion requiring thoracentesis. She has been at a nursing home since then. She has been having a cough recently seen she thinks she had food stuck in her esophagus. She has a history of esophageal cancer and has required frequent dilatations. Her cough is productive of clear sputum. She's not describing any new shortness of breath, PND or orthopnea. She does get around mostly in a wheelchair but says she's been doing a little walking. She has not been having any fevers or chills. She has not been having any chest pressure, neck or arm discomfort. She doesn't think she's had any new edema.   Allergies  Allergen Reactions  . Sulfa Antibiotics     Reaction unknown    Current Outpatient Prescriptions  Medication Sig Dispense Refill  . acetaminophen-codeine (TYLENOL #3) 300-30 MG per tablet Take 1 tablet by mouth every 12 (twelve) hours as needed. with food      . alendronate (FOSAMAX) 70 MG tablet Take 70 mg by mouth every 7 (seven) days. Take with a full glass of water on an empty stomach.      Marland Kitchen aspirin EC 81 MG tablet Take 81 mg by mouth daily.      Marland Kitchen ezetimibe (ZETIA) 10 MG tablet Take 10 mg by mouth daily.        . fish oil-omega-3 fatty acids 1000 MG capsule Take 2 g by mouth daily.      . furosemide (LASIX) 20 MG tablet Take 1 tablet (20 mg total) by mouth daily.  30 tablet  0  . levothyroxine (SYNTHROID, LEVOTHROID) 100 MCG tablet Take 100 mcg by mouth daily.        Marland Kitchen lisinopril (PRINIVIL,ZESTRIL) 40 MG tablet Take 40 mg by mouth daily.        . metoprolol (TOPROL-XL) 100 MG 24 hr tablet Take 100 mg by mouth daily.       . nitroGLYCERIN (NITROSTAT) 0.4 MG SL tablet Place 0.4 mg under the  tongue every 5 (five) minutes as needed. For chest pains      . omega-3 acid ethyl esters (LOVAZA) 1 G capsule 2 g.       . ranitidine (ZANTAC) 150 MG tablet Take 150 mg by mouth daily.       . simvastatin (ZOCOR) 80 MG tablet Take 80 mg by mouth at bedtime.       No current facility-administered medications for this visit.    Past Medical History  Diagnosis Date  . GERD (gastroesophageal reflux disease)   . Thyroid disease   . Hypertension   . Hyperlipidemia   . Osteoporosis   . Cancer     Past Surgical History  Procedure Laterality Date  . Esophagogatroduodenoscopy     . Orif hip fracture  08/31/2011    Procedure: OPEN REDUCTION INTERNAL FIXATION HIP;  Surgeon: Fuller Canada, MD;  Location: [facility];  Service: Orthopedics;  Laterality: Left;  ORIF/Gamma Nail Left Hip  . Eye surgery    . Coronary angioplasty with stent placement      Family History:  Noncontributory given her advanced age.  History   Social History  . Marital Status: Widowed    Spouse Name: N/A    Number of  Children: N/A  . Years of Education: N/A   Occupational History  . Not on file.   Social History Main Topics  . Smoking status: Never Smoker   . Smokeless tobacco: Current User    Types: Snuff  . Alcohol Use: No  . Drug Use: No  . Sexual Activity: Not on file   Other Topics Concern  . Not on file   Social History Narrative  . No narrative on file    ROS:  Positive for occasional headaches and dizziness, nausea and vomiting, lower extremity edema. Otherwise as stated in the HPI and negative for all other systems.  PHYSICAL EXAM BP 140/70  Pulse 83  Ht 5\' 7"  (1.702 m)  Wt 132 lb (59.875 kg)  BMI 20.67 kg/m2 GENERAL:  Well appearing for her age and multiple medical problems HEENT:  Pupils equal round and reactive, fundi not visualized, oral mucosa unremarkable NECK:  No jugular venous distention, waveform within normal limits, carotid upstroke brisk and symmetric, no bruits, no  thyromegaly LYMPHATICS:  No cervical, inguinal adenopathy LUNGS:  , a few basilar coarse crackles BACK:  No CVA tenderness, mild lordosis CHEST:  Unremarkable HEART:  PMI not displaced or sustained,S1 and S2 within normal limits, no S3, no S4, no clicks, no rubs, soft apical early peaking systolic , no diastolicmurmurs ABD:  Flat, positive bowel sounds normal in frequency in pitch, no bruits, no rebound, no guarding, no midline pulsatile mass, no hepatomegaly, no splenomegaly EXT:  2 plus pulses throughout, trace ankle edema, no cyanosis no clubbing SKIN:  No rashes no nodules NEURO:  Cranial nerves II through XII grossly intact, motor grossly intact throughout PSYCH:  Cognitively intact, oriented to person place and time   EKG:  Sinus rhythm with premature atrial contractions, rate 83, axis within normal limits, poor anterior R wave progression.  04/15/2013  ASSESSMENT AND PLAN  COUGH:  This seems to have started after she had difficulty swallowing. I don't think she has overt heart failure. I am going to check a BNP compared to her previous. However for now I will continue the cardiac medications as listed. She said that she didn't have this cough previously when she was on Prilosec so I will discontinue Zantac and start Prilosec but I do want her to follow soon with Dr. Modesto Charon.  CAD: She has no overt symptoms of active ischemia. No change in therapy is indicated.  CM:  As above  PALPITATIONS:  This was apparently the complaint for which she was referred. However, she tends to minimize this and says she might get a rapid heart rate for a few minutes when she is exerting herself. She's not describing any resting tachycardia palpitations that are ongoing or particularly symptomatic. She denies any syncope. At this point no further workup is suggested though I would have a low threshold for a Holter monitor.

## 2013-04-15 NOTE — Patient Instructions (Addendum)
Please stop your Zetia and Zantac Start Prilosec 20 mg a day  Follow up with Dr Modesto Charon in 1 month.  Follow up with Dr Antoine Poche in 3 months

## 2013-04-17 ENCOUNTER — Encounter: Payer: Self-pay | Admitting: Cardiology

## 2013-04-19 ENCOUNTER — Emergency Department (HOSPITAL_COMMUNITY): Payer: Medicare Other

## 2013-04-19 ENCOUNTER — Inpatient Hospital Stay (HOSPITAL_COMMUNITY)
Admission: EM | Admit: 2013-04-19 | Discharge: 2013-04-23 | DRG: 394 | Disposition: A | Discharge status: Skilled Nursing Facility | Payer: Medicare Other | Attending: Internal Medicine | Admitting: Internal Medicine

## 2013-04-19 ENCOUNTER — Encounter (HOSPITAL_COMMUNITY): Payer: Self-pay | Admitting: *Deleted

## 2013-04-19 DIAGNOSIS — N289 Disorder of kidney and ureter, unspecified: Secondary | ICD-10-CM

## 2013-04-19 DIAGNOSIS — I252 Old myocardial infarction: Secondary | ICD-10-CM

## 2013-04-19 DIAGNOSIS — E039 Hypothyroidism, unspecified: Secondary | ICD-10-CM

## 2013-04-19 DIAGNOSIS — E870 Hyperosmolality and hypernatremia: Secondary | ICD-10-CM | POA: Diagnosis present

## 2013-04-19 DIAGNOSIS — F17200 Nicotine dependence, unspecified, uncomplicated: Secondary | ICD-10-CM | POA: Diagnosis present

## 2013-04-19 DIAGNOSIS — Z8501 Personal history of malignant neoplasm of esophagus: Secondary | ICD-10-CM

## 2013-04-19 DIAGNOSIS — I2510 Atherosclerotic heart disease of native coronary artery without angina pectoris: Secondary | ICD-10-CM

## 2013-04-19 DIAGNOSIS — IMO0002 Reserved for concepts with insufficient information to code with codable children: Secondary | ICD-10-CM | POA: Diagnosis present

## 2013-04-19 DIAGNOSIS — K5900 Constipation, unspecified: Secondary | ICD-10-CM

## 2013-04-19 DIAGNOSIS — E860 Dehydration: Secondary | ICD-10-CM

## 2013-04-19 DIAGNOSIS — B029 Zoster without complications: Secondary | ICD-10-CM

## 2013-04-19 DIAGNOSIS — M8580 Other specified disorders of bone density and structure, unspecified site: Secondary | ICD-10-CM

## 2013-04-19 DIAGNOSIS — I428 Other cardiomyopathies: Secondary | ICD-10-CM | POA: Diagnosis present

## 2013-04-19 DIAGNOSIS — J90 Pleural effusion, not elsewhere classified: Secondary | ICD-10-CM

## 2013-04-19 DIAGNOSIS — N390 Urinary tract infection, site not specified: Secondary | ICD-10-CM

## 2013-04-19 DIAGNOSIS — G8929 Other chronic pain: Secondary | ICD-10-CM

## 2013-04-19 DIAGNOSIS — D649 Anemia, unspecified: Secondary | ICD-10-CM

## 2013-04-19 DIAGNOSIS — M1990 Unspecified osteoarthritis, unspecified site: Secondary | ICD-10-CM

## 2013-04-19 DIAGNOSIS — Z79899 Other long term (current) drug therapy: Secondary | ICD-10-CM

## 2013-04-19 DIAGNOSIS — E785 Hyperlipidemia, unspecified: Secondary | ICD-10-CM

## 2013-04-19 DIAGNOSIS — J189 Pneumonia, unspecified organism: Secondary | ICD-10-CM

## 2013-04-19 DIAGNOSIS — K5641 Fecal impaction: Secondary | ICD-10-CM | POA: Diagnosis present

## 2013-04-19 DIAGNOSIS — R1312 Dysphagia, oropharyngeal phase: Secondary | ICD-10-CM | POA: Diagnosis present

## 2013-04-19 DIAGNOSIS — C159 Malignant neoplasm of esophagus, unspecified: Secondary | ICD-10-CM

## 2013-04-19 DIAGNOSIS — R112 Nausea with vomiting, unspecified: Secondary | ICD-10-CM

## 2013-04-19 DIAGNOSIS — I5022 Chronic systolic (congestive) heart failure: Secondary | ICD-10-CM

## 2013-04-19 DIAGNOSIS — Z923 Personal history of irradiation: Secondary | ICD-10-CM

## 2013-04-19 DIAGNOSIS — K219 Gastro-esophageal reflux disease without esophagitis: Secondary | ICD-10-CM

## 2013-04-19 DIAGNOSIS — K222 Esophageal obstruction: Secondary | ICD-10-CM

## 2013-04-19 DIAGNOSIS — Z9861 Coronary angioplasty status: Secondary | ICD-10-CM

## 2013-04-19 DIAGNOSIS — M810 Age-related osteoporosis without current pathological fracture: Secondary | ICD-10-CM | POA: Diagnosis present

## 2013-04-19 DIAGNOSIS — T18108A Unspecified foreign body in esophagus causing other injury, initial encounter: Principal | ICD-10-CM | POA: Diagnosis present

## 2013-04-19 DIAGNOSIS — R1310 Dysphagia, unspecified: Secondary | ICD-10-CM

## 2013-04-19 DIAGNOSIS — J948 Other specified pleural conditions: Secondary | ICD-10-CM

## 2013-04-19 DIAGNOSIS — I509 Heart failure, unspecified: Secondary | ICD-10-CM | POA: Diagnosis present

## 2013-04-19 DIAGNOSIS — I10 Essential (primary) hypertension: Secondary | ICD-10-CM

## 2013-04-19 DIAGNOSIS — Z66 Do not resuscitate: Secondary | ICD-10-CM | POA: Diagnosis present

## 2013-04-19 DIAGNOSIS — N179 Acute kidney failure, unspecified: Secondary | ICD-10-CM | POA: Diagnosis present

## 2013-04-19 HISTORY — DX: Anemia, unspecified: D64.9

## 2013-04-19 HISTORY — DX: Pleural effusion, not elsewhere classified: J90

## 2013-04-19 LAB — LIPASE, BLOOD: Lipase: 24 U/L (ref 11–59)

## 2013-04-19 LAB — URINALYSIS, ROUTINE W REFLEX MICROSCOPIC
Nitrite: NEGATIVE
Specific Gravity, Urine: 1.025 (ref 1.005–1.030)
Urobilinogen, UA: 0.2 mg/dL (ref 0.0–1.0)
pH: 6 (ref 5.0–8.0)

## 2013-04-19 LAB — CBC WITH DIFFERENTIAL/PLATELET
Basophils Absolute: 0 10*3/uL (ref 0.0–0.1)
Basophils Relative: 0 % (ref 0–1)
Eosinophils Absolute: 0 10*3/uL (ref 0.0–0.7)
Eosinophils Relative: 0 % (ref 0–5)
MCH: 30.7 pg (ref 26.0–34.0)
MCHC: 31.9 g/dL (ref 30.0–36.0)
MCV: 96.3 fL (ref 78.0–100.0)
Platelets: 150 10*3/uL (ref 150–400)
RDW: 14.1 % (ref 11.5–15.5)

## 2013-04-19 LAB — URINE MICROSCOPIC-ADD ON

## 2013-04-19 LAB — COMPREHENSIVE METABOLIC PANEL
ALT: 12 U/L (ref 0–35)
AST: 24 U/L (ref 0–37)
Calcium: 10.5 mg/dL (ref 8.4–10.5)
GFR calc Af Amer: 30 mL/min — ABNORMAL LOW (ref >90)
Glucose, Bld: 78 mg/dL (ref 70–99)
Sodium: 152 mEq/L — ABNORMAL HIGH (ref 135–145)
Total Protein: 8.4 g/dL — ABNORMAL HIGH (ref 6.0–8.3)

## 2013-04-19 MED ORDER — SODIUM CHLORIDE 0.9 % IV SOLN
INTRAVENOUS | Status: DC
Start: 2013-04-19 — End: 2013-04-21
  Administered 2013-04-20: 18:00:00 via INTRAVENOUS

## 2013-04-19 MED ORDER — ONDANSETRON HCL 4 MG PO TABS: 4.0000 mg | ORAL_TABLET | Freq: Four times a day (QID) | ORAL | Status: DC | PRN

## 2013-04-19 MED ORDER — ONDANSETRON HCL 4 MG/2ML IJ SOLN
4.0000 mg | Freq: Once | INTRAMUSCULAR | Status: AC
  Administered 2013-04-19: 4 mg via INTRAVENOUS
  Filled 2013-04-19: qty 2

## 2013-04-19 MED ORDER — ONDANSETRON HCL 4 MG/2ML IJ SOLN: 4.0000 mg | Freq: Three times a day (TID) | INTRAMUSCULAR | Status: AC | PRN

## 2013-04-19 MED ORDER — SODIUM CHLORIDE 0.45 % IV SOLN
INTRAVENOUS | Status: AC
  Administered 2013-04-19: 19:00:00 via INTRAVENOUS

## 2013-04-19 MED ORDER — HEPARIN SODIUM (PORCINE) 5000 UNIT/ML IJ SOLN
5000.0000 [IU] | Freq: Three times a day (TID) | INTRAMUSCULAR | Status: DC
  Administered 2013-04-19 – 2013-04-20 (×2): 5000 [IU] via SUBCUTANEOUS
  Filled 2013-04-19 (×2): qty 1

## 2013-04-19 MED ORDER — SODIUM CHLORIDE 0.9 % IJ SOLN
3.0000 mL | Freq: Two times a day (BID) | INTRAMUSCULAR | Status: DC
  Administered 2013-04-20 – 2013-04-23 (×3): 3 mL via INTRAVENOUS

## 2013-04-19 MED ORDER — SODIUM CHLORIDE 0.9 % IV SOLN
INTRAVENOUS | Status: DC
  Administered 2013-04-19: 16:00:00 via INTRAVENOUS

## 2013-04-19 MED ORDER — ONDANSETRON HCL 4 MG/2ML IJ SOLN: 4.0000 mg | Freq: Four times a day (QID) | INTRAMUSCULAR | Status: DC | PRN

## 2013-04-19 MED ORDER — SODIUM CHLORIDE 0.9 % IV BOLUS (SEPSIS)
700.0000 mL | Freq: Once | INTRAVENOUS | Status: AC
  Administered 2013-04-19: 700 mL via INTRAVENOUS

## 2013-04-19 NOTE — ED Provider Notes (Signed)
Scribed for [name], MD, the patient was seen in room [facility]. This chart was scribed by Lewanda Rife, ED scribe. Patient's care was started at 1559  CSN: [id number]     Arrival date & time 04/19/13  1544 History   First MD Initiated Contact with Patient 04/19/13 1556     Chief Complaint  Patient presents with  . Nausea  . Emesis   (Consider location/radiation/quality/duration/timing/severity/associated sxs/prior Treatment) The history is provided by the patient, medical records and the EMS personnel.  HPI Comments: Allison Collier is a 79 y.o. female brought in by ambulance, who presents to the Emergency Department with a hx of esophageal cancer and esophageal strictures requiring stretching complaining of waxing and waning moderate lower abdominal pain onset 6 days episodes lasting a few seconds. Reports associated decreased appetite (6 days), weakness, and emesis. Report nausea, and emesis is exacerbated by eating or drinking fluids. Denies any alleviating factors .Reports feels like food gets stuck in esophagus.  Denies associated fever, diarrhea, and nausea (at this time). Denies sick contacts. Reports she does physical therapy, walks, but usually uses wheel chair. Reports last BM was "few days ago". Pt is resident at [facility].  States she injured right hand (2 months ago) when she fell and broke the ? [facility] of her [facility], states the pain is gone now.    PCP Allison Collier   Past Medical History  Diagnosis Date  . GERD (gastroesophageal reflux disease)   . Thyroid disease   . Hypertension   . Hyperlipidemia   . Osteoporosis   . Cancer     Esophageal  . CAD (coronary artery disease)     Distant MI  . Cardiomyopathy     EF  . Anemia   . Pleural effusion    Past Surgical History  Procedure Laterality Date  . Esophagogatroduodenoscopy      multiple dilatations  . Orif hip fracture  08/31/2011    Procedure: OPEN REDUCTION INTERNAL FIXATION HIP;  Surgeon:  Fuller Canada, MD;  Location: [facility];  Service: Orthopedics;  Laterality: Left;  ORIF/Gamma Nail Left Hip  . Eye surgery    . Coronary angioplasty with stent placement     History reviewed. No pertinent family history. History  Substance Use Topics  . Smoking status: Never Smoker   . Smokeless tobacco: Current User    Types: Snuff  . Alcohol Use: No  lives in NH Uses a wheelchair Goes to PT for ambulation  OB History   Grav Para Term Preterm Abortions TAB SAB Ect Mult Living                 Review of Systems  Gastrointestinal: Positive for vomiting.    Allergies  Sulfa antibiotics  Home Medications   Current Outpatient Rx  Name  Route  Sig  Dispense  Refill  . acetaminophen-codeine (TYLENOL #3) 300-30 MG per tablet   Oral   Take 1 tablet by mouth every 12 (twelve) hours as needed for pain. (For Moderate pain) take with food do not exceed 4gms of tylenol in 24hrs         . aspirin 81 MG chewable tablet   Oral   Chew 81 mg by mouth daily.         . furosemide (LASIX) 20 MG tablet   Oral   Take 20 mg by mouth daily.         Marland Kitchen levothyroxine (SYNTHROID, LEVOTHROID) 25 MCG tablet   Oral  Take 25 mcg by mouth daily before breakfast.         . lisinopril (PRINIVIL,ZESTRIL) 5 MG tablet   Oral   Take 5 mg by mouth daily.         . metoprolol tartrate (LOPRESSOR) 25 MG tablet   Oral   Take 12.5 mg by mouth 2 (two) times daily.         Marland Kitchen omega-3 acid ethyl esters (LOVAZA) 1 G capsule   Oral   Take 2 g by mouth daily.         Marland Kitchen omeprazole (PRILOSEC) 20 MG capsule   Oral   Take 20 mg by mouth daily.         . simvastatin (ZOCOR) 40 MG tablet   Oral   Take 40 mg by mouth daily.         . nitroGLYCERIN (NITROSTAT) 0.4 MG SL tablet   Sublingual   Place 0.4 mg under the tongue every 5 (five) minutes as needed. For chest pains           ED Triage Vitals  Enc Vitals Group     BP 04/19/13 1556 156/73 mmHg     Pulse Rate 04/19/13 1556 88      Resp 04/19/13 1556 17     Temp 04/19/13 1556 98 F (36.7 C)     Temp src 04/19/13 1556 Oral     SpO2 04/19/13 1556 96 %     Weight 04/19/13 1556 130 lb (58.968 kg)     Height 04/19/13 1556 5\' 6"  (1.676 m)     Head Cir --      Peak Flow --      Pain Score --      Pain Loc --      Pain Edu? --      Excl. in GC? --    Vital signs normal except mild hypertension    Physical Exam  Nursing note and vitals reviewed. Constitutional: She is oriented to person, place, and time.  Non-toxic appearance. She does not appear ill. No distress.  Edentulous, frail elderly female  HENT:  Head: Normocephalic and atraumatic.  Right Ear: External ear normal.  Left Ear: External ear normal.  Nose: Nose normal. No mucosal edema or rhinorrhea.  Mouth/Throat: Oropharynx is clear and moist. Mucous membranes are dry. No dental abscesses or edematous.  Dry tongue  Eyes: Conjunctivae and EOM are normal. Pupils are equal, round, and reactive to light.  Neck: Normal range of motion and full passive range of motion without pain. Neck supple.  Cardiovascular: Normal rate, regular rhythm and normal heart sounds.  Exam reveals no gallop and no friction rub.   No murmur heard. Pulmonary/Chest: Effort normal and breath sounds normal. No respiratory distress. She has no wheezes. She has no rhonchi. She has no rales. She exhibits no tenderness and no crepitus.  Abdominal: Soft. Normal appearance and bowel sounds are normal. She exhibits no distension. There is no tenderness. There is no rebound and no guarding.    No surgical scars on abdomen. Right lateral mid-abdominal scar from abscess drainage.   Musculoskeletal: Normal range of motion. She exhibits no edema and no tenderness.  Moves all extremities well.   Neurological: She is alert and oriented to person, place, and time. She has normal strength. No cranial nerve deficit.  Skin: Skin is warm, dry and intact. No rash noted. No erythema. No pallor.   Psychiatric: She has a normal mood and affect. Her speech  is normal and behavior is normal. Her mood appears not anxious.    ED Course  Procedures (including critical care time) Medications  0.9 %  sodium chloride infusion ( Intravenous New Bag/Given 04/19/13 1627)  sodium chloride 0.9 % bolus 700 mL (0 mLs Intravenous Stopped 04/19/13 1804)  ondansetron (ZOFRAN) injection 4 mg (4 mg Intravenous Given 04/19/13 1627)   5:02 PM 2 sips of water given to patient and almost immediately started vomiting back what she had ingested.   17:32 Dr Karilyn Cota, made aware that patient may need to have esophageal stricture streteched again (or possibly recurrence of her esophageal cancer)  17:57 Dr Lafe Garin, admit to tele, team 2   Labs Review  Results for orders placed during the hospital encounter of 04/19/13  CBC WITH DIFFERENTIAL      Result Value Range   WBC 5.1  4.0 - 10.5 K/uL   RBC 4.27  3.87 - 5.11 MIL/uL   Hemoglobin 13.1  12.0 - 15.0 g/dL   HCT 16.1  09.6 - 04.5 %   MCV 96.3  78.0 - 100.0 fL   MCH 30.7  26.0 - 34.0 pg   MCHC 31.9  30.0 - 36.0 g/dL   RDW 40.9  81.1 - 91.4 %   Platelets 150  150 - 400 K/uL   Neutrophils Relative % 78 (*) 43 - 77 %   Neutro Abs 4.0  1.7 - 7.7 K/uL   Lymphocytes Relative 14  12 - 46 %   Lymphs Abs 0.7  0.7 - 4.0 K/uL   Monocytes Relative 7  3 - 12 %   Monocytes Absolute 0.4  0.1 - 1.0 K/uL   Eosinophils Relative 0  0 - 5 %   Eosinophils Absolute 0.0  0.0 - 0.7 K/uL   Basophils Relative 0  0 - 1 %   Basophils Absolute 0.0  0.0 - 0.1 K/uL  COMPREHENSIVE METABOLIC PANEL      Result Value Range   Sodium 152 (*) 135 - 145 mEq/L   Potassium 4.2  3.5 - 5.1 mEq/L   Chloride 107  96 - 112 mEq/L   CO2 26  19 - 32 mEq/L   Glucose, Bld 78  70 - 99 mg/dL   BUN 62 (*) 6 - 23 mg/dL   Creatinine, Ser 7.82 (*) 0.50 - 1.10 mg/dL   Calcium 95.6  8.4 - 21.3 mg/dL   Total Protein 8.4 (*) 6.0 - 8.3 g/dL   Albumin 4.1  3.5 - 5.2 g/dL   AST 24  0 - 37 U/L   ALT 12  0  - 35 U/L   Alkaline Phosphatase 92  39 - 117 U/L   Total Bilirubin 0.6  0.3 - 1.2 mg/dL   GFR calc non Af Amer 26 (*) >90 mL/min   GFR calc Af Amer 30 (*) >90 mL/min  LIPASE, BLOOD      Result Value Range   Lipase 24  11 - 59 U/L   Laboratory interpretation all normal except hypernatremia, new acute renal insufficiency since June  UA pending   Imaging Review Dg Abd Acute W/chest  04/19/2013   *RADIOLOGY REPORT*  Clinical Data: Nausea and vomiting.  Weakness.  ACUTE ABDOMEN SERIES (ABDOMEN 2 VIEW & CHEST 1 VIEW)  Comparison: 01/21/2013 chest radiograph.  Findings: Cardiopericardial silhouette and mediastinal contours are within normal limits.  Aortic arch atherosclerosis.  Small right pleural effusion is present with associated atelectasis.  There is no free air underneath the hemidiaphragms.  Severe atherosclerosis of the visceral vessels is present with prominent curvilinear splenic artery calcification.  There is no organomegaly.  Large amount of stool is present within the rectum.  Partially visualized left hip screw.  IMPRESSION:  1.  Large amount of stool in the rectum suggesting fecal impaction. 2.  Bowel gas pattern is nonobstructive. 3.  Small right pleural effusion with associated atelectasis. Effusion appears slightly larger than chest radiograph 01/21/2013.   Original Report Authenticated By: Andreas Newport, M.D.    MDM  patient presents with nausea and vomiting however it appears she may have another esophageal stricture. She has hypernatremic dehydration with new acute renal insufficiency from her dehydration. She is being admitted for IV fluids and endoscopy to see if she has another stricture.    1. Nausea and vomiting   2. Constipation   3. Acute hypernatremia   4. Acute renal insufficiency   5. Esophageal obstruction   6. Dehydration      I personally performed the services described in this documentation, which was scribed in my presence. The recorded information has  been reviewed and considered.     [name], MD 04/19/13 [phone]

## 2013-04-19 NOTE — H&P (Signed)
[facility] History and Physical  Allison Collier:[id number] DOB: Jul 31, 1921 DOA: 04/19/2013  Referring physician: Dr. Lynelle Doctor PCP: Rudi Heap, MD  Specialists: GI  Chief Complaint: Dysphagia  HPI: Allison Collier is a 79 y.o. female has a past medical history significant for coronary artery disease, chronic systolic heart failure with an ejection fraction of 30-35%, history of remote esophageal cancer complicated by esophageal strictures, hypertension, presents today with a chief complaint of dysphagia for the past 5 or 6 days, associated with poor by mouth intake. Patient states that her symptoms have came on rather suddenly, and she initially thought she got some broccoli stuck in her throat, however she hasn't been able to eat much since. She is able to swallow, however she feels like the food is getting stuck just below her throat. She denies any chest pain. She denies any shortness or breath. She endorses a single episode of lightheadedness and dizziness about 4 days ago. This has resolved. She has no abdominal pain, nausea or vomiting or diarrhea. In fact she endorses constipation. In the emergency room, blood workup showed acute kidney injury with an elevation of creatinine to 1.67 from normal baseline and hypernatremia with a sodium of 152.  She had a recent hospitalization about 3 months ago here, when she came with community-acquired pneumonia, her hospitalization was complicated by the presence of a right-sided pleural effusion, which was drained yielding 1.7 L of exudative fluid, without evidence of malignancy, thought to be exudative. She also had a hydropneumothorax status post chest tube insertion. She was discharged to a nursing home where she's been residing since.   As far as her esophageal strictures, it looks like she had multiple EGDs around 2009 and multiple dilatations. Patient states that hasn't had an endoscopy since and she was doing fine up until 6 days  ago.  Review of Systems: As per history of present illness, otherwise negative   Past Medical History  Diagnosis Date  . GERD (gastroesophageal reflux disease)   . Thyroid disease   . Hypertension   . Hyperlipidemia   . Osteoporosis   . Cancer     Esophageal  . CAD (coronary artery disease)     Distant MI  . Cardiomyopathy     EF  . Anemia   . Pleural effusion    Past Surgical History  Procedure Laterality Date  . Esophagogatroduodenoscopy      multiple dilatations  . Orif hip fracture  08/31/2011    Procedure: OPEN REDUCTION INTERNAL FIXATION HIP;  Surgeon: Fuller Canada, MD;  Location: [facility];  Service: Orthopedics;  Laterality: Left;  ORIF/Gamma Nail Left Hip  . Eye surgery    . Coronary angioplasty with stent placement     Social History:  reports that she has never smoked. Her smokeless tobacco use includes Snuff. She reports that she does not drink alcohol or use illicit drugs.  Allergies  Allergen Reactions  . Sulfa Antibiotics     Reaction unknown    History reviewed. No pertinent family history.  Prior to Admission medications   Medication Sig Start Date End Date Taking? Authorizing Provider  acetaminophen-codeine (TYLENOL #3) 300-30 MG per tablet Take 1 tablet by mouth every 12 (twelve) hours as needed for pain. (For Moderate pain) take with food do not exceed 4gms of tylenol in 24hrs   Yes Historical Provider, MD  aspirin 81 MG chewable tablet Chew 81 mg by mouth daily.   Yes Historical Provider, MD  furosemide (LASIX) 20 MG  tablet Take 20 mg by mouth daily.   Yes Historical Provider, MD  levothyroxine (SYNTHROID, LEVOTHROID) 25 MCG tablet Take 25 mcg by mouth daily before breakfast.   Yes Historical Provider, MD  lisinopril (PRINIVIL,ZESTRIL) 5 MG tablet Take 5 mg by mouth daily.   Yes Historical Provider, MD  metoprolol tartrate (LOPRESSOR) 25 MG tablet Take 12.5 mg by mouth 2 (two) times daily.   Yes Historical Provider, MD  omega-3 acid ethyl esters  (LOVAZA) 1 G capsule Take 2 g by mouth daily.   Yes Historical Provider, MD  omeprazole (PRILOSEC) 20 MG capsule Take 20 mg by mouth daily.   Yes Historical Provider, MD  simvastatin (ZOCOR) 40 MG tablet Take 40 mg by mouth daily.   Yes Historical Provider, MD  nitroGLYCERIN (NITROSTAT) 0.4 MG SL tablet Place 0.4 mg under the tongue every 5 (five) minutes as needed. For chest pains    Historical Provider, MD   Physical Exam: Filed Vitals:   04/19/13 1556 04/19/13 1803  BP: 156/73 148/69  Pulse: 88 82  Temp: 98 F (36.7 C) 98 F (36.7 C)  TempSrc: Oral Oral  Resp: 17 18  Height: 5\' 6"  (1.676 m)   Weight: 58.968 kg (130 lb)   SpO2: 96% 96%     General:  No apparent distress, frail appearing Caucasian female   Eyes: PERRL, EOMI, no scleral icterus  ENT: dry mucous membranes,   Neck: supple, no JVD  Cardiovascular: regular rate without MRG; 2+ peripheral pulses  Respiratory: CTA biL, good air movement without wheezing  Abdomen: soft, non tender to palpation, positive bowel sounds, no guarding, no rebound  Skin: no rashes  Musculoskeletal: no peripheral edema  Psychiatric: normal mood and affect  Neurologic: Nonfocal   Labs on Admission:  Basic Metabolic Panel:  Recent Labs Lab 04/19/13 1609  NA 152*  K 4.2  CL 107  CO2 26  GLUCOSE 78  BUN 62*  CREATININE 1.67*  CALCIUM 10.5   Liver Function Tests:  Recent Labs Lab 04/19/13 1609  AST 24  ALT 12  ALKPHOS 92  BILITOT 0.6  PROT 8.4*  ALBUMIN 4.1    Recent Labs Lab 04/19/13 1609  LIPASE 24   CBC:  Recent Labs Lab 04/19/13 1609  WBC 5.1  NEUTROABS 4.0  HGB 13.1  HCT 41.1  MCV 96.3  PLT 150   BNP (last 3 results)  Recent Labs  01/15/13 0526  PROBNP 1699.0*   Radiological Exams on Admission: Dg Abd Acute W/chest  04/19/2013   *RADIOLOGY REPORT*  Clinical Data: Nausea and vomiting.  Weakness.  ACUTE ABDOMEN SERIES (ABDOMEN 2 VIEW & CHEST 1 VIEW)  Comparison: 01/21/2013 chest  radiograph.  Findings: Cardiopericardial silhouette and mediastinal contours are within normal limits.  Aortic arch atherosclerosis.  Small right pleural effusion is present with associated atelectasis.  There is no free air underneath the hemidiaphragms.  Severe atherosclerosis of the visceral vessels is present with prominent curvilinear splenic artery calcification.  There is no organomegaly.  Large amount of stool is present within the rectum.  Partially visualized left hip screw.  IMPRESSION:  1.  Large amount of stool in the rectum suggesting fecal impaction. 2.  Bowel gas pattern is nonobstructive. 3.  Small right pleural effusion with associated atelectasis. Effusion appears slightly larger than chest radiograph 01/21/2013.   Original Report Authenticated By: Andreas Newport, M.D.   Assessment/Plan Principal Problem:   Dysphagia Active Problems:   HTN (hypertension)   CAD (coronary artery disease)  Hypothyroid   Pleural effusion on right   Chronic systolic CHF (congestive heart failure)   Hypernatremia  Dysphagia  - Given her symptoms and her history, this may represent a stricture, however symptoms have started rather suddenly. Gastroenterology has been consulted, appreciate their input. For now we'll keep patient n.p.o. and provide IV hydration.  Chronic systolic heart failure - Ejection fraction of 30-35%. Currently being worked up as an outpatient. She appears compensated at this time and her weight on admission is 130 pounds. Last time she was in the hospital she was 129 pounds. Will be careful with IV fluids Hypothyroidism - We'll recheck a TSH as last time it was elevated. Hypernatremia - Due to dehydration. Gentle hydration with half normal saline, with close monitoring of her respiratory status given low ejection fraction. Acute kidney injury - Due to dehydration. We'll recheck BMP in the morning after IV fluids. Coronary artery disease - Patient is currently strict n.p.o.,  Will restart her medications as soon as possible Hypertension - hold oral meds. Would use IV antihypertensives if needed Right-sided pleural effusion - This was evaluated last hospitalization, it appeared not to be malignant. Effusion seems slightly larger this time, however her respiratory status is good and she is breathing on room air. Closely monitor. Fecal impaction - will likely need an enema in the morning. DVT prophylaxis - heparin subcutaneous  Code Status: DO NOT RESUSCITATE   Family Communication: Daughters at bedside   Disposition Plan: Inpatient   Time spent: 53   Costin M. Elvera Lennox, MD [facility] Pager [phone]  If 7PM-7AM, please contact night-coverage [email or web address] Password [facility] 04/19/2013, 6:41 PM

## 2013-04-19 NOTE — ED Notes (Addendum)
Pt resident at [facility], per EMS pt has had N/V for 5 days, denies pain, denies diarrhea, pt ambulated from EMS stretcher to room stretcher. Pt has some chronic confusion, per EMS pt is at mental baseline. Pt states she "throws up whenever she eats or drinks".

## 2013-04-20 DIAGNOSIS — R1310 Dysphagia, unspecified: Secondary | ICD-10-CM

## 2013-04-20 LAB — CBC
HCT: 37.2 % (ref 36.0–46.0)
Hemoglobin: 11.7 g/dL — ABNORMAL LOW (ref 12.0–15.0)
MCHC: 31.5 g/dL (ref 30.0–36.0)
MCV: 98.2 fL (ref 78.0–100.0)
RDW: 14.3 % (ref 11.5–15.5)

## 2013-04-20 LAB — BASIC METABOLIC PANEL
BUN: 54 mg/dL — ABNORMAL HIGH (ref 6–23)
BUN: 50 mg/dL — ABNORMAL HIGH (ref 6–23)
CO2: 24 mEq/L (ref 19–32)
Calcium: 10.2 mg/dL (ref 8.4–10.5)
Chloride: 112 mEq/L (ref 96–112)
Creatinine, Ser: 1.27 mg/dL — ABNORMAL HIGH (ref 0.50–1.10)
Creatinine, Ser: 1.05 mg/dL (ref 0.50–1.10)
GFR calc Af Amer: 42 mL/min — ABNORMAL LOW (ref >90)
GFR calc non Af Amer: 36 mL/min — ABNORMAL LOW (ref >90)
Glucose, Bld: 64 mg/dL — ABNORMAL LOW (ref 70–99)
Potassium: 4.3 mEq/L (ref 3.5–5.1)

## 2013-04-20 LAB — MRSA PCR SCREENING: MRSA by PCR: NEGATIVE

## 2013-04-20 MED ORDER — MILK AND MOLASSES ENEMA
Freq: Once | RECTAL | Status: AC
  Administered 2013-04-20: 15:00:00 via RECTAL

## 2013-04-20 MED ORDER — SODIUM CHLORIDE 0.9 % IV SOLN: INTRAVENOUS | Status: DC

## 2013-04-20 NOTE — Progress Notes (Signed)
Checked patient for impaction per MD order.  Found small amount of stool high up in the rectum.  Reported results to MD.  Inquired about enema. Will continue to monitor patient. Schonewitz, Candelaria Stagers 04/20/2013

## 2013-04-20 NOTE — Progress Notes (Signed)
[facility] PROGRESS NOTE  Allison Collier RUE:[id number] DOB: 10/01/1920 DOA: 04/19/2013 PCP: Rudi Heap, MD  HPI:  79 year old lady with a history of esophageal cancer status post radiation therapy with previous strictures, presented with a chief complaint of dysphagia. Because of dehydration, she was also found to be hypernatremic.  Assessment/Plan: Dysphagia  - Given her symptoms and her history, this may represent a stricture, however symptoms have started rather suddenly.  - Gastroenterology has been consulted, appreciate their input. It seems like there is plan for an EGD tomorrow. Chronic systolic heart failure  - Ejection fraction of 30-35%. Currently being worked up as an outpatient. She appears compensated at this time and her weight on admission is 130 pounds. Last time she was in the hospital she was 129 pounds. Will be careful with IV fluids  Hypothyroidism  - We'll recheck a TSH as last time it was elevated.  - Still in process this morning Hypernatremia  - Due to dehydration. Gentle hydration with half normal saline, with close monitoring of her respiratory status given low ejection fraction.  - Hypernatremia stable Acute kidney injury  - Due to dehydration. We'll recheck BMP in the morning after IV fluids.  - Renal function improved this morning after IV fluids. Continue at low rate. Coronary artery disease  - Patient is currently strict n.p.o., Will restart her medications as soon as possible  Hypertension - hold oral meds. Would use IV antihypertensives if needed  Right-sided pleural effusion  - This was evaluated last hospitalization, it appeared not to be malignant. Effusion seems slightly larger this time, however her respiratory status is good and she is breathing on room air. Closely monitor.  - Stable this morning Fecal impaction  - Nursing staff to check for impaction DVT prophylaxis - SCDs  Code Status: DNR Family Communication: daughter bedside   Disposition Plan: inpatient, home when medically ready  Consultants:  Gastroenterology  Procedures:  None  Anti-infectives   None     Antibiotics Given (last 72 hours)   None      HPI/Subjective: States that she feels well this morning, complains of dry mouth  Objective: Filed Vitals:   04/19/13 2207 04/20/13 0408 04/20/13 0609 04/20/13 0616  BP: 159/88  173/64 154/66  Pulse: 67  90 87  Temp: 98.5 F (36.9 C)  97.4 F (36.3 C)   TempSrc: Oral  Oral   Resp: 20  16   Height:      Weight:  56.019 kg (123 lb 8 oz)    SpO2: 100%  94%     Intake/Output Summary (Last 24 hours) at 04/20/13 1017 Last data filed at 04/20/13 0641  Gross per 24 hour  Intake 498.33 ml  Output    200 ml  Net 298.33 ml   Filed Weights   04/19/13 1556 04/19/13 1900 04/20/13 0408  Weight: 58.968 kg (130 lb) 54.8 kg (120 lb 13 oz) 56.019 kg (123 lb 8 oz)    Exam:   General:  NAD, frail appearing Caucasian female  Cardiovascular: regular rate and rhythm, without MRG  Respiratory: good air movement, clear to auscultation throughout, no wheezing, ronchi or rales  Abdomen: soft, not tender to palpation, positive bowel sounds  MSK: no peripheral edema  Neuro: CN 2-12 grossly intact, MS 5/5 in all 4  Data Reviewed: Basic Metabolic Panel:  Recent Labs Lab 04/19/13 1609 04/20/13 0450  NA 152* 152*  K 4.2 4.3  CL 107 111  CO2 26 27  GLUCOSE 78  64*  BUN 62* 54*  CREATININE 1.67* 1.27*  CALCIUM 10.5 9.8   Liver Function Tests:  Recent Labs Lab 04/19/13 1609  AST 24  ALT 12  ALKPHOS 92  BILITOT 0.6  PROT 8.4*  ALBUMIN 4.1    Recent Labs Lab 04/19/13 1609  LIPASE 24   CBC:  Recent Labs Lab 04/19/13 1609 04/20/13 0450  WBC 5.1 3.4*  NEUTROABS 4.0  --   HGB 13.1 11.7*  HCT 41.1 37.2  MCV 96.3 98.2  PLT 150 143*   BNP (last 3 results)  Recent Labs  01/15/13 0526  PROBNP 1699.0*   CBG: No results found for this basename: GLUCAP,  in the last 168  hours  Recent Results (from the past 240 hour(s))  MRSA PCR SCREENING     Status: None   Collection Time    04/19/13 11:14 PM      Result Value Range Status   MRSA by PCR NEGATIVE  NEGATIVE Final   Comment:            The GeneXpert MRSA Assay (FDA     approved for NASAL specimens     only), is one component of a     comprehensive MRSA colonization     surveillance program. It is not     intended to diagnose MRSA     infection nor to guide or     monitor treatment for     MRSA infections.     Studies: Dg Abd Acute W/chest  04/19/2013   *RADIOLOGY REPORT*  Clinical Data: Nausea and vomiting.  Weakness.  ACUTE ABDOMEN SERIES (ABDOMEN 2 VIEW & CHEST 1 VIEW)  Comparison: 01/21/2013 chest radiograph.  Findings: Cardiopericardial silhouette and mediastinal contours are within normal limits.  Aortic arch atherosclerosis.  Small right pleural effusion is present with associated atelectasis.  There is no free air underneath the hemidiaphragms.  Severe atherosclerosis of the visceral vessels is present with prominent curvilinear splenic artery calcification.  There is no organomegaly.  Large amount of stool is present within the rectum.  Partially visualized left hip screw.  IMPRESSION:  1.  Large amount of stool in the rectum suggesting fecal impaction. 2.  Bowel gas pattern is nonobstructive. 3.  Small right pleural effusion with associated atelectasis. Effusion appears slightly larger than chest radiograph 01/21/2013.   Original Report Authenticated By: Andreas Newport, M.D.    Scheduled Meds: . sodium chloride  3 mL Intravenous Q12H   Continuous Infusions: . sodium chloride 50 mL/hr at 04/19/13 1902  . sodium chloride    . sodium chloride      Principal Problem:   Dysphagia Active Problems:   HTN (hypertension)   CAD (coronary artery disease)   Hypothyroid   Pleural effusion on right   Chronic systolic CHF (congestive heart failure)   Hypernatremia   Time spent: 35  Pamella Pert, MD [facility] Pager [phone]. If 7 PM - 7 AM, please contact night-coverage at [email or web address], password [facility] 04/20/2013, 10:17 AM  LOS: 1 day

## 2013-04-20 NOTE — Care Management Note (Unsigned)
    Page 1 of 1   04/23/2013     3:50:52 PM   CARE MANAGEMENT NOTE 04/23/2013  Patient:  Allison Collier, Allison Collier   Account Number:  [id number]  Date Initiated:  04/20/2013  Documentation initiated by:  Anibal Henderson  Subjective/Objective Assessment:   admitted from [facility] with nausea and vomiting     Action/Plan:   CSW following   Anticipated DC Date:  04/22/2013   Anticipated DC Plan:  SKILLED NURSING FACILITY         Choice offered to / List presented to:             Status of service:  Completed, signed off Medicare Important Message given?   (If response is "NO", the following Medicare IM given date fields will be blank) Date Medicare IM given:   Date Additional Medicare IM given:    Discharge Disposition:  SKILLED NURSING FACILITY  Per UR Regulation:    If discussed at Long Length of Stay Meetings, dates discussed:    Comments:  04/23/13 1550 Anibal Henderson RN/CM

## 2013-04-20 NOTE — Progress Notes (Signed)
UR chart review completed.  

## 2013-04-20 NOTE — Progress Notes (Signed)
[facility] called about patient's admission status verified patients DOB and SS#.  They will call back tomorrow about patient's bed status at [facility]

## 2013-04-20 NOTE — Progress Notes (Signed)
MD ordered milk and molasses enema.  Orders received and carried out.  Will continue to monitor.  Schonewitz, Candelaria Stagers 04/20/2013

## 2013-04-20 NOTE — Consult Note (Addendum)
Referring Provider: No ref. provider found Primary Care Physician:  Rudi Heap, MD Primary Gastroenterologist:  Dr.   Jaquita Rector for Consultation:  79 year old lady  presents the hospital yesterday with some acute difficulties swallowing. Apparently felt she may have gotten some food stuck. This lady has a history of esophageal cancer and had radiation therapy in the past. No apparent surgery. Multiple EGDs back in 2009. Has done well until the past 7 days when she has developed difficulties with the solid food and initiating swallowing. More "phlegm" in the back of the throat. She is able to control her saliva. Intermittently brings up liquids after she attempts to swallow. No GERD symptoms. No abdominal pain. She is chronically constipated but has more difficulties recently. She has been placed on heparin for DVT prophylaxis. Again, no EGD in the past or so years. HPI:   Past Medical History  Diagnosis Date  . GERD (gastroesophageal reflux disease)   . Thyroid disease   . Hypertension   . Hyperlipidemia   . Osteoporosis   . Cancer     Esophageal  . CAD (coronary artery disease)     Distant MI  . Cardiomyopathy     EF  . Anemia   . Pleural effusion     Past Surgical History  Procedure Laterality Date  . Esophagogatroduodenoscopy      multiple dilatations  . Orif hip fracture  08/31/2011    Procedure: OPEN REDUCTION INTERNAL FIXATION HIP;  Surgeon: Fuller Canada, MD;  Location: [facility];  Service: Orthopedics;  Laterality: Left;  ORIF/Gamma Nail Left Hip  . Eye surgery    . Coronary angioplasty with stent placement      Prior to Admission medications   Medication Sig Start Date End Date Taking? Authorizing Provider  acetaminophen-codeine (TYLENOL #3) 300-30 MG per tablet Take 1 tablet by mouth every 12 (twelve) hours as needed for pain. (For Moderate pain) take with food do not exceed 4gms of tylenol in 24hrs   Yes Historical Provider, MD  aspirin 81 MG chewable tablet Chew 81 mg  by mouth daily.   Yes Historical Provider, MD  furosemide (LASIX) 20 MG tablet Take 20 mg by mouth daily.   Yes Historical Provider, MD  levothyroxine (SYNTHROID, LEVOTHROID) 25 MCG tablet Take 25 mcg by mouth daily before breakfast.   Yes Historical Provider, MD  lisinopril (PRINIVIL,ZESTRIL) 5 MG tablet Take 5 mg by mouth daily.   Yes Historical Provider, MD  metoprolol tartrate (LOPRESSOR) 25 MG tablet Take 12.5 mg by mouth 2 (two) times daily.   Yes Historical Provider, MD  omega-3 acid ethyl esters (LOVAZA) 1 G capsule Take 2 g by mouth daily.   Yes Historical Provider, MD  omeprazole (PRILOSEC) 20 MG capsule Take 20 mg by mouth daily.   Yes Historical Provider, MD  simvastatin (ZOCOR) 40 MG tablet Take 40 mg by mouth daily.   Yes Historical Provider, MD  nitroGLYCERIN (NITROSTAT) 0.4 MG SL tablet Place 0.4 mg under the tongue every 5 (five) minutes as needed. For chest pains    Historical Provider, MD    Current Facility-Administered Medications  Medication Dose Route Frequency Provider Last Rate Last Dose  . 0.45 % sodium chloride infusion   Intravenous Continuous Pamella Pert, MD 50 mL/hr at 04/19/13 1902    . 0.9 %  sodium chloride infusion   Intravenous Continuous [name], MD      . heparin injection 5,000 Units  5,000 Units Subcutaneous Q8H Pamella Pert, MD  5,000 Units at 04/20/13 0516  . ondansetron (ZOFRAN) tablet 4 mg  4 mg Oral Q6H PRN Pamella Pert, MD       Or  . ondansetron (ZOFRAN) injection 4 mg  4 mg Intravenous Q6H PRN Costin Gherghe, MD      . sodium chloride 0.9 % injection 3 mL  3 mL Intravenous Q12H Pamella Pert, MD   3 mL at 04/20/13 0905    Allergies as of 04/19/2013 - Review Complete 04/19/2013  Allergen Reaction Noted  . Sulfa antibiotics  11/04/2010    History reviewed. No pertinent family history.  History   Social History  . Marital Status: Widowed    Spouse Name: N/A    Number of Children: 7  . Years of Education: N/A   Occupational  History  . Not on file.   Social History Main Topics  . Smoking status: Never Smoker   . Smokeless tobacco: Current User    Types: Snuff  . Alcohol Use: No  . Drug Use: No  . Sexual Activity: Not on file   Other Topics Concern  . Not on file   Social History Narrative   [facility] at [facility] [place]    Review of Systems: Gen: Denies any fever, chills, sweats, anorexia, fatigue, weakness, malaise, weight loss, and sleep disorder CV: Denies chest pain, angina, palpitations, syncope, orthopnea, PND, peripheral edema, and claudication. Resp: Denies dyspnea at rest, dyspnea with exercise, cough, sputum, wheezing, coughing up blood, and pleurisy. GI: Denies vomiting blood, jaundice, and fecal incontinence.  Psych: anxiety, memory loss, suicidal ideation, hallucinations, Heme: Denies bruising, bleeding, and enlarged lymph nodes.   Physical Exam: Vital signs in last 24 hours: Temp:  [97.4 F (36.3 C)-98.5 F (36.9 C)] 97.4 F (36.3 C) (09/01 0609) Pulse Rate:  [67-90] 87 (09/01 0616) Resp:  [16-20] 16 (09/01 0609) BP: (148-173)/(64-88) 154/66 mmHg (09/01 0616) SpO2:  [94 %-100 %] 94 % (09/01 0609) Weight:  [120 lb 13 oz (54.8 kg)-130 lb (58.968 kg)] 123 lb 8 oz (56.019 kg) (09/01 0408)   General:   Frail elderly lady conversant and alert. Accompanied by one of her daughters. There's been no acute distress  Head:  Normocephalic and atraumatic. Eyes:  Sclera clear, no icterus.   Conjunctiva pink. Ears:  Normal auditory acuity. Nose:  No deformity, discharge,  or lesions. Mouth:  No deformity or lesions, dentition normal. Neck:  Supple; no masses or thyromegaly. Lungs:  Clear throughout to auscultation.   No wheezes, crackles, or rhonchi. No acute distress. Heart:  Regular rate and rhythm; no murmurs, clicks, rubs,  or gallops. Abdomen:  Soft, nontender and nondistended. No masses, hepatosplenomegaly or hernias noted. Normal bowel sounds, without guarding, and without rebound.    .  Intake/Output from previous day: 08/31 0701 - 09/01 0700 In: 498.3 [I.V.:498.3] Out: 200 [Urine:200] Intake/Output this shift:    Lab Results:  Recent Labs  04/19/13 1609 04/20/13 0450  WBC 5.1 3.4*  HGB 13.1 11.7*  HCT 41.1 37.2  PLT 150 143*   BMET  Recent Labs  04/19/13 1609 04/20/13 0450  NA 152* 152*  K 4.2 4.3  CL 107 111  CO2 26 27  GLUCOSE 78 64*  BUN 62* 54*  CREATININE 1.67* 1.27*  CALCIUM 10.5 9.8   LFT  Recent Labs  04/19/13 1609  PROT 8.4*  ALBUMIN 4.1  AST 24  ALT 12  ALKPHOS 92  BILITOT 0.6    Impression:  Interesting 79 year old lady with distant history of reported  esophageal cancer a has undergone radiation therapy previously and multiple esophageal dilations dilations in the past but none in the past 5 years now presenting with acute dysphagia symptoms. She has vague symptoms of both esophageal and oropharyngeal dysphagia. Symptoms started rather acutely. She's not having any trouble swallowing her saliva. It may well be that she could have a partial food impaction. Acute onset of oropharyngeal symptoms would be highly unusual for Zenker's diverticulum. An underlying esophageal motility disorder, alone, would not satisfactorily explain her clinical presentation. Likewise, a recurrent tumor would be expected to produce more insidiously progressive symptoms. I suppose a benign process such as a peptic stricture also would remain in the differential at this time.   Recommendations:  Further evaluation via either EGD or barium pill esophagram discuss with patient and daughter. All things considered, I suspect an EGD would give [place] the most information and would allow for therapeutic intervention if indicated. She's currently on heparin which is a contraindication to endoscopy today.  Chronic constipation as well.  We'll plan for EGD with possible esophageal dilation, etc. tomorrow. We'll hold anticoagulation for procedure tomorrow.  The  risks, benefits, limitations, imponderables and alternatives regarding both EGD  have been reviewed with the patient. Questions have been answered. All parties agreeable.  Nursing staff to check for impaction.  I'd like to thank the hospitalist service for allowing me to see this nice lady today.

## 2013-04-20 NOTE — Clinical Social Work Psychosocial (Signed)
Clinical Social Work Department BRIEF PSYCHOSOCIAL ASSESSMENT 04/20/2013  Patient:  Allison Collier, Allison Collier     Account Number:  [id number]     Admit date:  04/19/2013  Clinical Social Worker:  Nancie Neas  Date/Time:  04/20/2013 10:50 AM  Referred by:  CSW  Date Referred:  04/20/2013 Referred for  SNF Placement   Other Referral:   Interview type:  Patient Other interview type:   daughter- Allison Collier    PSYCHOSOCIAL DATA Living Status:  FACILITY Admitted from facility:  [facility] Level of care:  Skilled Nursing Facility Primary support name:  children Primary support relationship to patient:  CHILD, ADULT Degree of support available:   adequate    CURRENT CONCERNS Current Concerns  Post-Acute Placement   Other Concerns:    SOCIAL WORK ASSESSMENT / PLAN CSW met with pt and pt's daughter Allison Collier at bedside. Pt alert and oriented and known to CSW from previous admissions. She has been a resident at [facility] for the past 3 months. Pt has several children who live locally and check on pt frequently. Pt had a hip fracture in the past and went to SNF then transferred home. During last hospitalization in June, it was felt she needed rehab again. Per Tresa Endo at facility, pt is nursing level of care and is a one person assist with most ADLs but can feed herself. Okay to return at d/c.   Assessment/plan status:  Psychosocial Support/Ongoing Assessment of Needs Other assessment/ plan:   Information/referral to community resources:   [employer]    PATIENT'S/FAMILY'S RESPONSE TO PLAN OF CARE: Pt states she likes [place]. She would like to return home with her daughter at some point, but plans to go back to [facility] from hospital. CSW will continue to follow.       Derenda Fennel, [place] [phone]

## 2013-04-21 ENCOUNTER — Telehealth: Payer: Self-pay

## 2013-04-21 ENCOUNTER — Telehealth: Payer: Self-pay | Admitting: General Practice

## 2013-04-21 ENCOUNTER — Other Ambulatory Visit: Payer: Self-pay | Admitting: General Practice

## 2013-04-21 ENCOUNTER — Encounter (HOSPITAL_COMMUNITY): Payer: Self-pay

## 2013-04-21 ENCOUNTER — Encounter: Payer: Self-pay | Admitting: General Practice

## 2013-04-21 ENCOUNTER — Encounter (HOSPITAL_COMMUNITY)
Admission: EM | Disposition: A | Discharge status: Skilled Nursing Facility | Payer: Self-pay | Source: Home / Self Care | Attending: Internal Medicine

## 2013-04-21 DIAGNOSIS — IMO0002 Reserved for concepts with insufficient information to code with codable children: Secondary | ICD-10-CM

## 2013-04-21 DIAGNOSIS — T18108A Unspecified foreign body in esophagus causing other injury, initial encounter: Secondary | ICD-10-CM

## 2013-04-21 DIAGNOSIS — Z8501 Personal history of malignant neoplasm of esophagus: Secondary | ICD-10-CM

## 2013-04-21 DIAGNOSIS — R1310 Dysphagia, unspecified: Secondary | ICD-10-CM

## 2013-04-21 DIAGNOSIS — R1319 Other dysphagia: Secondary | ICD-10-CM

## 2013-04-21 HISTORY — PX: ESOPHAGOGASTRODUODENOSCOPY: SHX5428

## 2013-04-21 LAB — CBC
MCH: 30 pg (ref 26.0–34.0)
MCHC: 30.5 g/dL (ref 30.0–36.0)
MCV: 98.3 fL (ref 78.0–100.0)
Platelets: 125 10*3/uL — ABNORMAL LOW (ref 150–400)
RDW: 14.3 % (ref 11.5–15.5)

## 2013-04-21 LAB — URINE CULTURE: Colony Count: 7000

## 2013-04-21 LAB — BASIC METABOLIC PANEL
BUN: 45 mg/dL — ABNORMAL HIGH (ref 6–23)
CO2: 24 mEq/L (ref 19–32)
Calcium: 10 mg/dL (ref 8.4–10.5)
Creatinine, Ser: 1.07 mg/dL (ref 0.50–1.10)
GFR calc non Af Amer: 44 mL/min — ABNORMAL LOW (ref >90)
Glucose, Bld: 60 mg/dL — ABNORMAL LOW (ref 70–99)
Sodium: 155 mEq/L — ABNORMAL HIGH (ref 135–145)

## 2013-04-21 SURGERY — EGD (ESOPHAGOGASTRODUODENOSCOPY)
Anesthesia: Moderate Sedation | Laterality: Left

## 2013-04-21 MED ORDER — MEPERIDINE HCL 100 MG/ML IJ SOLN
INTRAMUSCULAR | Status: DC | PRN
  Administered 2013-04-21: 25 mg

## 2013-04-21 MED ORDER — MEPERIDINE HCL 100 MG/ML IJ SOLN
INTRAMUSCULAR | Status: AC
  Filled 2013-04-21: qty 1

## 2013-04-21 MED ORDER — ASPIRIN EC 81 MG PO TBEC
81.0000 mg | DELAYED_RELEASE_TABLET | Freq: Every day | ORAL | Status: DC
  Administered 2013-04-22 – 2013-04-23 (×2): 81 mg via ORAL
  Filled 2013-04-21 (×2): qty 1

## 2013-04-21 MED ORDER — MIDAZOLAM HCL 5 MG/5ML IJ SOLN
INTRAMUSCULAR | Status: DC | PRN
  Administered 2013-04-21 (×2): 1 mg via INTRAVENOUS

## 2013-04-21 MED ORDER — SIMETHICONE 40 MG/0.6ML PO SUSP
ORAL | Status: DC | PRN
  Administered 2013-04-21: 12:00:00

## 2013-04-21 MED ORDER — PANTOPRAZOLE SODIUM 40 MG IV SOLR
40.0000 mg | Freq: Once | INTRAVENOUS | Status: AC
  Administered 2013-04-21: 40 mg via INTRAVENOUS
  Filled 2013-04-21: qty 40

## 2013-04-21 MED ORDER — LISINOPRIL 5 MG PO TABS
5.0000 mg | ORAL_TABLET | Freq: Every day | ORAL | Status: DC
  Administered 2013-04-22 – 2013-04-23 (×2): 5 mg via ORAL
  Filled 2013-04-21 (×2): qty 1

## 2013-04-21 MED ORDER — ONDANSETRON HCL 4 MG/2ML IJ SOLN
INTRAMUSCULAR | Status: AC
  Filled 2013-04-21: qty 2

## 2013-04-21 MED ORDER — MIDAZOLAM HCL 5 MG/5ML IJ SOLN
INTRAMUSCULAR | Status: AC
  Filled 2013-04-21: qty 5

## 2013-04-21 MED ORDER — SIMVASTATIN 20 MG PO TABS
20.0000 mg | ORAL_TABLET | Freq: Every day | ORAL | Status: DC
  Administered 2013-04-21 – 2013-04-22 (×2): 20 mg via ORAL
  Filled 2013-04-21 (×2): qty 1

## 2013-04-21 MED ORDER — ONDANSETRON HCL 4 MG/2ML IJ SOLN
INTRAMUSCULAR | Status: DC | PRN
  Administered 2013-04-21: 4 mg via INTRAVENOUS

## 2013-04-21 MED ORDER — LEVOTHYROXINE SODIUM 25 MCG PO TABS
25.0000 ug | ORAL_TABLET | Freq: Every day | ORAL | Status: DC
  Administered 2013-04-22 – 2013-04-23 (×2): 25 ug via ORAL
  Filled 2013-04-21 (×2): qty 1

## 2013-04-21 MED ORDER — METOPROLOL TARTRATE 25 MG PO TABS
12.5000 mg | ORAL_TABLET | Freq: Two times a day (BID) | ORAL | Status: DC
  Administered 2013-04-21 – 2013-04-23 (×4): 12.5 mg via ORAL
  Filled 2013-04-21 (×4): qty 1

## 2013-04-21 MED ORDER — PANTOPRAZOLE SODIUM 40 MG PO TBEC
40.0000 mg | DELAYED_RELEASE_TABLET | Freq: Every day | ORAL | Status: DC
  Filled 2013-04-21 (×4): qty 1

## 2013-04-21 MED ORDER — LANSOPRAZOLE 15 MG PO TBDP
30.0000 mg | ORAL_TABLET | Freq: Every day | ORAL | Status: DC
  Filled 2013-04-21 (×3): qty 2

## 2013-04-21 MED ORDER — SODIUM CHLORIDE 0.45 % IV SOLN
INTRAVENOUS | Status: AC
  Administered 2013-04-21 (×2): via INTRAVENOUS

## 2013-04-21 MED ORDER — BUTAMBEN-TETRACAINE-BENZOCAINE 2-2-14 % EX AERO
INHALATION_SPRAY | CUTANEOUS | Status: DC | PRN
  Administered 2013-04-21: 2 via TOPICAL

## 2013-04-21 NOTE — Progress Notes (Addendum)
[facility] PROGRESS NOTE  Allison Collier ZOX:[id number] DOB: 29-Aug-1920 DOA: 04/19/2013 PCP: Rudi Heap, MD  HPI:  79 year old lady with a history of esophageal cancer status post radiation therapy with previous strictures, presented with a chief complaint of dysphagia. Because of dehydration, she was also found to be hypernatremic.  Assessment/Plan: Dysphagia  - Given her symptoms and her history, this may represent a stricture, however symptoms have started rather suddenly.  - Gastroenterology has been consulted, appreciate their input.  - EGD today. Chronic systolic heart failure  - Ejection fraction of 30-35%. Currently being worked up as an outpatient. She appears compensated at this time and her weight on admission is 130 pounds. Last time she was in the hospital she was 129 pounds. Will be careful with IV fluids  Hypothyroidism  - We'll recheck a TSH as last time it was elevated, normal at 3.8 now Hypernatremia  - Due to dehydration. Hydration with half normal saline, with close monitoring of her respiratory status given low ejection fraction.  Acute kidney injury  - improved Coronary artery disease  - Patient is currently strict n.p.o., Will restart her medications as soon as possible  Hypertension - hold oral meds. Would use IV antihypertensives if needed  Right-sided pleural effusion  - This was evaluated last hospitalization, it appeared not to be malignant. Effusion seems slightly larger this time, however her respiratory status is good and she is breathing on room air. Closely monitor.  - Stable this morning Fecal impaction  - s/p enema DVT prophylaxis - SCDs   Addendum: short evidence of A fib on the telemetry monitor following the EGD, now in sinus rhythm at 6:35 pm. Patient is rate controlled, on BB which were held due to strict NPO but now restarted. Overall, I doubt that she is a good candidate for anticoagulation, will hold for now, especially since she  will have another EGD in ~10 days per Dr. Jena Gauss. This could be later addressed in an outpatient basis.   Code Status: DNR Family Communication: daughter bedside  Disposition Plan: inpatient, home when medically ready  Consultants:  Gastroenterology  Procedures:  None  Anti-infectives   None     Antibiotics Given (last 72 hours)   None      HPI/Subjective: - feeling hungry  Objective: Filed Vitals:   04/20/13 1350 04/20/13 2133 04/21/13 0500 04/21/13 0607  BP: 174/60 171/77  158/72  Pulse: 90 86  92  Temp: 98 F (36.7 C) 97.7 F (36.5 C)  97.5 F (36.4 C)  TempSrc: Oral Oral  Oral  Resp: 17 20  18   Height:      Weight:   56.2 kg (123 lb 14.4 oz)   SpO2: 100% 96%  98%    Intake/Output Summary (Last 24 hours) at 04/21/13 0915 Last data filed at 04/21/13 0603  Gross per 24 hour  Intake 1196.67 ml  Output      1 ml  Net 1195.67 ml   Filed Weights   04/19/13 1900 04/20/13 0408 04/21/13 0500  Weight: 54.8 kg (120 lb 13 oz) 56.019 kg (123 lb 8 oz) 56.2 kg (123 lb 14.4 oz)    Exam:   General:  NAD, frail appearing Caucasian female  Cardiovascular: regular rate and rhythm, without MRG  Respiratory: good air movement, clear to auscultation throughout, no wheezing, ronchi or rales  Abdomen: soft, not tender to palpation, positive bowel sounds  MSK: no peripheral edema  Neuro: CN 2-12 grossly intact, MS 5/5 in all 4  Data Reviewed: Basic Metabolic Panel:  Recent Labs Lab 04/19/13 1609 04/20/13 0450 04/20/13 1740 04/21/13 0538  NA 152* 152* 155* 155*  K 4.2 4.3 4.0 4.3  CL 107 111 112 115*  CO2 26 27 24 24   GLUCOSE 78 64* 68* 60*  BUN 62* 54* 50* 45*  CREATININE 1.67* 1.27* 1.05 1.07  CALCIUM 10.5 9.8 10.2 10.0   Liver Function Tests:  Recent Labs Lab 04/19/13 1609  AST 24  ALT 12  ALKPHOS 92  BILITOT 0.6  PROT 8.4*  ALBUMIN 4.1    Recent Labs Lab 04/19/13 1609  LIPASE 24   CBC:  Recent Labs Lab 04/19/13 1609  04/20/13 0450 04/21/13 0538  WBC 5.1 3.4* 4.7  NEUTROABS 4.0  --   --   HGB 13.1 11.7* 12.4  HCT 41.1 37.2 40.6  MCV 96.3 98.2 98.3  PLT 150 143* 125*   BNP (last 3 results)  Recent Labs  01/15/13 0526  PROBNP 1699.0*   CBG: No results found for this basename: GLUCAP,  in the last 168 hours  Recent Results (from the past 240 hour(s))  MRSA PCR SCREENING     Status: None   Collection Time    04/19/13 11:14 PM      Result Value Range Status   MRSA by PCR NEGATIVE  NEGATIVE Final   Comment:            The GeneXpert MRSA Assay (FDA     approved for NASAL specimens     only), is one component of a     comprehensive MRSA colonization     surveillance program. It is not     intended to diagnose MRSA     infection nor to guide or     monitor treatment for     MRSA infections.     Studies: Dg Abd Acute W/chest  04/19/2013   *RADIOLOGY REPORT*  Clinical Data: Nausea and vomiting.  Weakness.  ACUTE ABDOMEN SERIES (ABDOMEN 2 VIEW & CHEST 1 VIEW)  Comparison: 01/21/2013 chest radiograph.  Findings: Cardiopericardial silhouette and mediastinal contours are within normal limits.  Aortic arch atherosclerosis.  Small right pleural effusion is present with associated atelectasis.  There is no free air underneath the hemidiaphragms.  Severe atherosclerosis of the visceral vessels is present with prominent curvilinear splenic artery calcification.  There is no organomegaly.  Large amount of stool is present within the rectum.  Partially visualized left hip screw.  IMPRESSION:  1.  Large amount of stool in the rectum suggesting fecal impaction. 2.  Bowel gas pattern is nonobstructive. 3.  Small right pleural effusion with associated atelectasis. Effusion appears slightly larger than chest radiograph 01/21/2013.   Original Report Authenticated By: Andreas Newport, M.D.    Scheduled Meds: . sodium chloride  3 mL Intravenous Q12H   Continuous Infusions: . sodium chloride    . sodium chloride       Principal Problem:   Dysphagia Active Problems:   HTN (hypertension)   CAD (coronary artery disease)   Hypothyroid   Pleural effusion on right   Chronic systolic CHF (congestive heart failure)   Hypernatremia  Time spent: 25  Pamella Pert, MD [facility] Pager [phone]. If 7 PM - 7 AM, please contact night-coverage at [email or web address], password [facility] 04/21/2013, 9:15 AM  LOS: 2 days

## 2013-04-21 NOTE — Telephone Encounter (Signed)
I gave appt to [facility] at [facility] and faxed instructions to [address] Nurse at [phone]

## 2013-04-21 NOTE — Op Note (Signed)
[facility] [address] [place], [address]   ENDOSCOPY PROCEDURE REPORT  PATIENT: Allison Collier, Allison Collier  MR#: [id number] BIRTHDATE: 1921-04-06 , 89  yrs. old GENDER: Female ENDOSCOPIST: R.  Roetta Sessions, MD FACP [facility] REFERRED BY:  Rudi Heap, M.D. PROCEDURE DATE:  04/21/2013 PROCEDURE:     EGD with removal of food impaction followed by gastric biopsy  INDICATIONS:    one week history of dysphagia; history of esophageal tumor  INFORMED CONSENT:   The risks, benefits, limitations, alternatives and imponderables have been discussed.  The potential for biopsy, esophogeal dilation, etc. have also been reviewed.  Questions have been answered.  All parties agreeable.  Please see the history and physical in the medical record for more information.  MEDICATIONS:   Versed 2 mg IV and Demerol 25 mg. Zofran 4 mg IV. Cetacaine spray.  DESCRIPTION OF PROCEDURE:   The EG-[id number] ([id number])  endoscope was introduced through the mouth and advanced to the second portion of the duodenum without difficulty or limitations.  The mucosal surfaces were surveyed very carefully during advancement of the scope and upon withdrawal.  Retroflexion view of the proximal stomach and esophagogastric junction was performed.      FINDINGS: Food bolus "Ball-valving" in the proximal esophagus (appeared to be a large broccoli stem). This  food bolus was gently engage with the tip of the gastroscope and pressure was applied; with this maneuver, this food bolus passed across the esophageal stricture and rapidly down into the stomach. Examination of the esophagus revealed a short benign-appearing mid-esophageal stricture with overlying ulcer ( please see image 3 above). This appeared to be a benign fibrotic process. Stomach had a small amount of food debris within it. Gastric mucosa was inflamed appearing with some verrucous changes involving the antrum. No out and out infiltrating process. No  ulcer or tumor seen. Small hiatal hernia. Patent pylorus. Normal first and second portion of the duodenum  No dilation performed today because upper GI tract was not empty. Biopsies the gastric antrum taken   COMPLICATIONS:  None  IMPRESSION:   Esophageal food impaction-status post disimpaction. Mid body -benign-appearing esophageal stricture with overlying ulceration-not manipulated.  Abnormal stomach-status post gastric biopsy  RECOMMENDATIONS:  Stick with a full liquid diet for the next week to 10 days. Add Protonix 40 mg daily. Will have patient return in approximately 10 days for elective EGD with esophageal dilation, etc. as appropriate. Followup on pathology.    _______________________________ R. Roetta Sessions, MD FACP [facility] eSigned:  R. Roetta Sessions, MD FACP [facility] 04/21/2013 12:12 PM     CC:  PATIENT NAME:  Allison Collier, Allison Collier MR#: [id number]

## 2013-04-21 NOTE — Progress Notes (Signed)
04/21/13 1756 Received notification that patient had converted to afib. Patient asymptomatic, no c/o pain or discomfort. VS stable, see flowsheet. HR in the 80s on telemetry. Notified Dr. Elvera Lennox, order received for 12 lead EKG. Order placed, notified RT. Earnstine Regal, RN

## 2013-04-21 NOTE — Telephone Encounter (Signed)
Opened in error

## 2013-04-21 NOTE — Telephone Encounter (Signed)
Dr. Jena Gauss called- pt is an inpatient at [facility] and had an egd today for a food impaction. He wants her to return in 2 weeks for a repeat EGD with ED. Pt is a resident at [facility]. Please schedule.

## 2013-04-21 NOTE — Telephone Encounter (Signed)
Pt is scheduled for 05/07/13 at 8:45am

## 2013-04-22 DIAGNOSIS — R1310 Dysphagia, unspecified: Secondary | ICD-10-CM

## 2013-04-22 DIAGNOSIS — I10 Essential (primary) hypertension: Secondary | ICD-10-CM

## 2013-04-22 LAB — BASIC METABOLIC PANEL
BUN: 30 mg/dL — ABNORMAL HIGH (ref 6–23)
Calcium: 9.5 mg/dL (ref 8.4–10.5)
Creatinine, Ser: 0.99 mg/dL (ref 0.50–1.10)
GFR calc Af Amer: 56 mL/min — ABNORMAL LOW (ref >90)
GFR calc non Af Amer: 49 mL/min — ABNORMAL LOW (ref >90)

## 2013-04-22 MED ORDER — LANSOPRAZOLE 15 MG PO TBDP
30.0000 mg | ORAL_TABLET | Freq: Every day | ORAL | Status: DC
  Administered 2013-04-22: 30 mg via ORAL
  Filled 2013-04-22 (×3): qty 2

## 2013-04-22 MED ORDER — LANSOPRAZOLE 30 MG PO TBDP
30.0000 mg | ORAL_TABLET | Freq: Every day | ORAL | Status: DC
  Filled 2013-04-22 (×2): qty 1

## 2013-04-22 MED ORDER — SODIUM CHLORIDE 0.45 % IV SOLN
INTRAVENOUS | Status: DC
  Administered 2013-04-22: 15:00:00 via INTRAVENOUS
  Administered 2013-04-23: 1000 mL via INTRAVENOUS

## 2013-04-22 NOTE — Progress Notes (Signed)
Subjective: Denies dysphagia. No abdominal pain. Tolerating full liquid diet.   Objective: Vital signs in last 24 hours: Temp:  [97.2 F (36.2 C)-98.4 F (36.9 C)] 97.5 F (36.4 C) (09/03 0500) Pulse Rate:  [35-120] 68 (09/03 0500) Resp:  [17-23] 18 (09/03 0500) BP: (142-187)/(61-99) 162/80 mmHg (09/03 0500) SpO2:  [95 %-100 %] 97 % (09/03 0500) Weight:  [132 lb (59.875 kg)-132 lb 1.8 oz (59.927 kg)] 132 lb 1.8 oz (59.927 kg) (09/03 0500) Last BM Date: 04/20/13 General:   Alert and oriented, pleasant Head:  Normocephalic and atraumatic. Heart:  S1, S2 present Lungs: Clear to auscultation bilaterally, without wheezing, rales, or rhonchi.  Abdomen:  Bowel sounds present, soft, non-tender, non-distended. No HSM or hernias noted. No rebound or guarding.  Extremities:  Without clubbing or edema.  Intake/Output from previous day: 09/02 0701 - 09/03 0700 In: 1476.8 [P.O.:120; I.V.:1356.8] Out: -  Intake/Output this shift:    Lab Results:  Recent Labs  04/19/13 1609 04/20/13 0450 04/21/13 0538  WBC 5.1 3.4* 4.7  HGB 13.1 11.7* 12.4  HCT 41.1 37.2 40.6  PLT 150 143* 125*   BMET  Recent Labs  04/20/13 1740 04/21/13 0538 04/22/13 0539  NA 155* 155* 150*  K 4.0 4.3 3.9  CL 112 115* 114*  CO2 24 24 30   GLUCOSE 68* 60* 88  BUN 50* 45* 30*  CREATININE 1.05 1.07 0.99  CALCIUM 10.2 10.0 9.5   LFT  Recent Labs  04/19/13 1609  PROT 8.4*  ALBUMIN 4.1  AST 24  ALT 12  ALKPHOS 92  BILITOT 0.6     Assessment: 79 year old female s/p EGD 9/2 with disimpaction of food bolus and esophageal stricture with overlying ulceration noted and inflamed gastric antrum; patient clinically improved and tolerating full liquids currently. Will need repeat EGD with dilation by Dr. Jena Gauss. This has been scheduled for Sept 18th through our office.   Until next EGD, continue full liquid diet and PPI daily. Stable from GI standpoint.   Plan: Protonix 40 mg daily Full liquid diet until  repeat EGD/ED on 9/18 with Dr. Jena Gauss Follow-up on pathology Follow peripherally for now  Nira Retort, ANP-BC [facility]     LOS: 3 days    04/22/2013, 8:59 AM

## 2013-04-22 NOTE — Progress Notes (Signed)
Allison Collier UJW:[id number] DOB: 01-05-1921 DOA: 04/19/2013 PCP: Rudi Heap, MD   Subjective: This lady feels better, she is able to tolerate a liquid diet. There is no nausea or vomiting or abdominal pain.           Physical Exam: Blood pressure 162/80, pulse 68, temperature 97.5 F (36.4 C), temperature source Oral, resp. rate 18, height 5\' 6"  (1.676 m), weight 59.927 kg (132 lb 1.8 oz), SpO2 97.00%. She looks systemically well, reasonably well hydrated. Heart sounds are present without murmurs. Lung fields are clear. Abdomen is soft and nontender. She is alert.   Investigations:  Recent Results (from the past 240 hour(s))  URINE CULTURE     Status: None   Collection Time    04/19/13 10:21 PM      Result Value Range Status   Specimen Description URINE, CLEAN CATCH   Final   Special Requests NONE   Final   Culture  Setup Time     Final   Value: 04/20/2013 23:32     Performed at [facility] Count     Final   Value: 7,000 COLONIES/ML     Performed at [facility]   Culture     Final   Value: INSIGNIFICANT GROWTH     Performed at [facility]   Report Status 04/21/2013 FINAL   Final  MRSA PCR SCREENING     Status: None   Collection Time    04/19/13 11:14 PM      Result Value Range Status   MRSA by PCR NEGATIVE  NEGATIVE Final   Comment:            The GeneXpert MRSA Assay (FDA     approved for NASAL specimens     only), is one component of a     comprehensive MRSA colonization     surveillance program. It is not     intended to diagnose MRSA     infection nor to guide or     monitor treatment for     MRSA infections.     Basic Metabolic Panel:  Recent Labs  56/21/30 0538 04/22/13 0539  NA 155* 150*  K 4.3 3.9  CL 115* 114*  CO2 24 30  GLUCOSE 60* 88  BUN 45* 30*  CREATININE 1.07 0.99  CALCIUM 10.0 9.5   Liver Function Tests:  Recent Labs  04/19/13 1609  AST 24  ALT 12  ALKPHOS 92  BILITOT 0.6  PROT  8.4*  ALBUMIN 4.1     CBC:  Recent Labs  04/19/13 1609 04/20/13 0450 04/21/13 0538  WBC 5.1 3.4* 4.7  NEUTROABS 4.0  --   --   HGB 13.1 11.7* 12.4  HCT 41.1 37.2 40.6  MCV 96.3 98.2 98.3  PLT 150 143* 125*    No results found.    Medications: I have reviewed the patient's current medications.  Impression: 1. Dysphagia secondary to food impaction, resolved with a disimpaction of food bolus via EGD yesterday. 2. Dehydration and hypernatremia, slowly improving. 3. Hypertension. 4. Chronic systolic congestive heart failure, ejection fraction 30-35%. Currently she is clinically compensated.     Plan: 1. Continue with half normal saline at 75 cc an hour. 2. Continue with liquid diet. 3. Probable discharge tomorrow.  Consultants:  Gastroenterology, Dr. Kendell Bane.   Procedures:  EGD by Dr. Kendell Bane, 04/21/2013.   Antibiotics:  None.  Code Status: DO NOT RESUSCITATE.  Family Communication: Discussed plan with patient and patient's daughter at the bedside.   Disposition Plan: Back to [facility] when medically stable, probably tomorrow.  Time spent: 20 minutes.   LOS: 3 days   Wilson Singer Pager [phone]  04/22/2013, 1:30 PM

## 2013-04-23 ENCOUNTER — Encounter (HOSPITAL_COMMUNITY): Payer: Self-pay | Admitting: Internal Medicine

## 2013-04-23 LAB — BASIC METABOLIC PANEL
CO2: 28 mEq/L (ref 19–32)
Calcium: 8.6 mg/dL (ref 8.4–10.5)
Creatinine, Ser: 0.75 mg/dL (ref 0.50–1.10)
GFR calc Af Amer: 84 mL/min — ABNORMAL LOW (ref >90)

## 2013-04-23 NOTE — Clinical Social Work Note (Signed)
Pt d/c today to [facility]. Pt, family, and facility aware and agreeable. Family asking about EMS transport. CSW informed them that pt did not qualify as she has been getting up. Offered wheelchair Zenaida Niece or to see if [facility] would be willing to transport, but family said they would. D/C summary faxed.   Derenda Fennel, [place] [phone]

## 2013-04-23 NOTE — Progress Notes (Signed)
Patient with orders to be discharge to [facility]. Discharge packet sent with patient. Report called to [facility], nurse. Patient in stable condition upon discharge. Patient left in private vehicle with family.

## 2013-04-23 NOTE — Discharge Summary (Signed)
Physician Discharge Summary  Allison Collier ZOX:[id number] DOB: 07/06/1921 DOA: 04/19/2013  PCP: Rudi Heap, MD  Admit date: 04/19/2013 Discharge date: 04/23/2013  Time spent: Greater than 30 minutes  Recommendations for Outpatient Follow-up:  1. Followup for repeat EGD with Dr. Kendell Bane on 05/07/2013.  Discharge Diagnoses:  1. Dysphagia secondary to food impaction, status post EGD and disimpaction of food bolus. Patient to stay on a liquid diet until repeat EGD. 2. Dehydration, resolved. 3. Hypernatremia secondary to 2, resolved with hydration. 4. Hypertension. 5. Chronic systolic congestive heart failure, ejection fraction 30-35%, currently compensated.   Discharge Condition: Stable and improving.  Diet recommendation: Liquid diet.  Filed Weights   04/21/13 0500 04/21/13 1032 04/22/13 0500  Weight: 56.2 kg (123 lb 14.4 oz) 59.875 kg (132 lb) 59.927 kg (132 lb 1.8 oz)    History of present illness:  This 79 year old lady presented to the hospital with symptoms of dysphagia. Please see initial history as outlined below: HPI: Allison Collier is a 79 y.o. female has a past medical history significant for coronary artery disease, chronic systolic heart failure with an ejection fraction of 30-35%, history of remote esophageal cancer complicated by esophageal strictures, hypertension, presents today with a chief complaint of dysphagia for the past 5 or 6 days, associated with poor by mouth intake. Patient states that her symptoms have came on rather suddenly, and she initially thought she got some broccoli stuck in her throat, however she hasn't been able to eat much since. She is able to swallow, however she feels like the food is getting stuck just below her throat. She denies any chest pain. She denies any shortness or breath. She endorses a single episode of lightheadedness and dizziness about 4 days ago. This has resolved. She has no abdominal pain, nausea or vomiting or diarrhea. In  fact she endorses constipation. In the emergency room, blood workup showed acute kidney injury with an elevation of creatinine to 1.67 from normal baseline and hypernatremia with a sodium of 152.  She had a recent hospitalization about 3 months ago here, when she came with community-acquired pneumonia, her hospitalization was complicated by the presence of a right-sided pleural effusion, which was drained yielding 1.7 L of exudative fluid, without evidence of malignancy, thought to be exudative. She also had a hydropneumothorax status post chest tube insertion. She was discharged to a nursing home where she's been residing since.  As far as her esophageal strictures, it looks like she had multiple EGDs around 2009 and multiple dilatations. Patient states that hasn't had an endoscopy since and she was doing fine up until 6 days ago.  Hospital Course:  The patient was admitted and given intravenous fluids as she was initially dehydrated. She had hypernatremia. This slowly improved and upon discharge was in the normal range. She was seen by gastroenterology, Dr. Kendell Bane, who performed EGD on 04/21/2013. Findings are noted below. Post EGD, her symptoms improved and she was able to tolerate a liquid diet. Dr. Kendell Bane wishes her to stay on liquid diet until the patient has repeat EGD on 05/07/2013. She is now stable for discharge back to [facility].  Procedures:  EGD:  IMPRESSION: Esophageal food impaction-status post disimpaction.  Mid body -benign-appearing esophageal stricture with overlying  ulceration-not manipulated.  Abnormal stomach-status post gastric biopsy  Consultations:  Gastroenterology, Dr. Kendell Bane.  Discharge Exam: Filed Vitals:   04/23/13 0532  BP: 130/70  Pulse: 80  Temp: 98.3 F (36.8 C)  Resp:     General:  She looks systemically well. She is not toxic or septic. Cardiovascular: Heart sounds are present without murmurs or added sounds. Respiratory: Lung fields are  clear. Abdomen is soft nontender. Bowel sounds are heard. She is alert and orientated.  Discharge Instructions  Discharge Orders   Future Appointments Provider Department Dept Phone   06/24/2013 2:00 PM Rollene Rotunda, MD [facility] [place] [phone]   Future Orders Complete By Expires   Diet - low sodium heart healthy  As directed    Increase activity slowly  As directed        Medication List         acetaminophen-codeine 300-30 MG per tablet  Commonly known as:  TYLENOL #3  Take 1 tablet by mouth every 12 (twelve) hours as needed for pain. (For Moderate pain) take with food do not exceed 4gms of tylenol in 24hrs     aspirin 81 MG chewable tablet  Chew 81 mg by mouth daily.     furosemide 20 MG tablet  Commonly known as:  LASIX  Take 20 mg by mouth daily.     levothyroxine 25 MCG tablet  Commonly known as:  SYNTHROID, LEVOTHROID  Take 25 mcg by mouth daily before breakfast.     lisinopril 5 MG tablet  Commonly known as:  PRINIVIL,ZESTRIL  Take 5 mg by mouth daily.     metoprolol tartrate 25 MG tablet  Commonly known as:  LOPRESSOR  Take 12.5 mg by mouth 2 (two) times daily.     nitroGLYCERIN 0.4 MG SL tablet  Commonly known as:  NITROSTAT  Place 0.4 mg under the tongue every 5 (five) minutes as needed. For chest pains     omega-3 acid ethyl esters 1 G capsule  Commonly known as:  LOVAZA  Take 2 g by mouth daily.     omeprazole 20 MG capsule  Commonly known as:  PRILOSEC  Take 20 mg by mouth daily.     simvastatin 40 MG tablet  Commonly known as:  ZOCOR  Take 40 mg by mouth daily.       Allergies  Allergen Reactions  . Sulfa Antibiotics     Reaction unknown      The results of significant diagnostics from this hospitalization (including imaging, microbiology, ancillary and laboratory) are listed below for reference.    Significant Diagnostic Studies: Dg Abd Acute W/chest  04/19/2013   *RADIOLOGY REPORT*  Clinical Data: Nausea and  vomiting.  Weakness.  ACUTE ABDOMEN SERIES (ABDOMEN 2 VIEW & CHEST 1 VIEW)  Comparison: 01/21/2013 chest radiograph.  Findings: Cardiopericardial silhouette and mediastinal contours are within normal limits.  Aortic arch atherosclerosis.  Small right pleural effusion is present with associated atelectasis.  There is no free air underneath the hemidiaphragms.  Severe atherosclerosis of the visceral vessels is present with prominent curvilinear splenic artery calcification.  There is no organomegaly.  Large amount of stool is present within the rectum.  Partially visualized left hip screw.  IMPRESSION:  1.  Large amount of stool in the rectum suggesting fecal impaction. 2.  Bowel gas pattern is nonobstructive. 3.  Small right pleural effusion with associated atelectasis. Effusion appears slightly larger than chest radiograph 01/21/2013.   Original Report Authenticated By: Andreas Newport, M.D.    Microbiology: Recent Results (from the past 240 hour(s))  URINE CULTURE     Status: None   Collection Time    04/19/13 10:21 PM      Result Value Range Status   Specimen Description  URINE, CLEAN CATCH   Final   Special Requests NONE   Final   Culture  Setup Time     Final   Value: 04/20/2013 23:32     Performed at [facility] Count     Final   Value: 7,000 COLONIES/ML     Performed at [facility]   Culture     Final   Value: INSIGNIFICANT GROWTH     Performed at [facility]   Report Status 04/21/2013 FINAL   Final  MRSA PCR SCREENING     Status: None   Collection Time    04/19/13 11:14 PM      Result Value Range Status   MRSA by PCR NEGATIVE  NEGATIVE Final   Comment:            The GeneXpert MRSA Assay (FDA     approved for NASAL specimens     only), is one component of a     comprehensive MRSA colonization     surveillance program. It is not     intended to diagnose MRSA     infection nor to guide or     monitor treatment for     MRSA infections.      Labs: Basic Metabolic Panel:  Recent Labs Lab 04/20/13 0450 04/20/13 1740 04/21/13 0538 04/22/13 0539 04/23/13 0519  NA 152* 155* 155* 150* 142  K 4.3 4.0 4.3 3.9 3.8  CL 111 112 115* 114* 109  CO2 27 24 24 30 28   GLUCOSE 64* 68* 60* 88 87  BUN 54* 50* 45* 30* 20  CREATININE 1.27* 1.05 1.07 0.99 0.75  CALCIUM 9.8 10.2 10.0 9.5 8.6   Liver Function Tests:  Recent Labs Lab 04/19/13 1609  AST 24  ALT 12  ALKPHOS 92  BILITOT 0.6  PROT 8.4*  ALBUMIN 4.1    Recent Labs Lab 04/19/13 1609  LIPASE 24    CBC:  Recent Labs Lab 04/19/13 1609 04/20/13 0450 04/21/13 0538  WBC 5.1 3.4* 4.7  NEUTROABS 4.0  --   --   HGB 13.1 11.7* 12.4  HCT 41.1 37.2 40.6  MCV 96.3 98.2 98.3  PLT 150 143* 125*    BNP: BNP (last 3 results)  Recent Labs  01/15/13 0526  PROBNP 1699.0*        Signed:  Jhovanny Guinta C  [facility] 04/23/2013, 11:33 AM

## 2013-04-24 ENCOUNTER — Encounter: Payer: Self-pay | Admitting: Internal Medicine

## 2013-05-07 ENCOUNTER — Other Ambulatory Visit: Payer: Self-pay | Admitting: Internal Medicine

## 2013-05-07 MED ORDER — MEPERIDINE HCL 100 MG/ML IJ SOLN
INTRAMUSCULAR | Status: AC
  Filled 2013-05-07: qty 2

## 2013-05-07 MED ORDER — MIDAZOLAM HCL 5 MG/5ML IJ SOLN
INTRAMUSCULAR | Status: AC
  Filled 2013-05-07: qty 10

## 2013-05-07 MED ORDER — ONDANSETRON HCL 4 MG/2ML IJ SOLN
INTRAMUSCULAR | Status: AC
  Filled 2013-05-07: qty 2

## 2013-05-13 ENCOUNTER — Ambulatory Visit (HOSPITAL_COMMUNITY)
Admission: RE | Admit: 2013-05-13 | Discharge: 2013-05-13 | Disposition: A | Discharge status: Home / Self Care | Payer: Medicare Other | Source: Ambulatory Visit | Attending: Internal Medicine | Admitting: Internal Medicine

## 2013-05-13 ENCOUNTER — Encounter (HOSPITAL_COMMUNITY)
Admission: RE | Disposition: A | Discharge status: Home / Self Care | Payer: Self-pay | Source: Ambulatory Visit | Attending: Internal Medicine

## 2013-05-13 ENCOUNTER — Encounter (HOSPITAL_COMMUNITY): Payer: Self-pay | Admitting: *Deleted

## 2013-05-13 DIAGNOSIS — I10 Essential (primary) hypertension: Secondary | ICD-10-CM | POA: Insufficient documentation

## 2013-05-13 DIAGNOSIS — R1319 Other dysphagia: Secondary | ICD-10-CM

## 2013-05-13 DIAGNOSIS — R1310 Dysphagia, unspecified: Secondary | ICD-10-CM

## 2013-05-13 DIAGNOSIS — K222 Esophageal obstruction: Secondary | ICD-10-CM | POA: Insufficient documentation

## 2013-05-13 HISTORY — PX: ESOPHAGOGASTRODUODENOSCOPY (EGD) WITH ESOPHAGEAL DILATION: SHX5812

## 2013-05-13 SURGERY — ESOPHAGOGASTRODUODENOSCOPY (EGD) WITH ESOPHAGEAL DILATION
Anesthesia: Moderate Sedation

## 2013-05-13 MED ORDER — STERILE WATER FOR IRRIGATION IR SOLN
Status: DC | PRN
  Administered 2013-05-13: 12:00:00

## 2013-05-13 MED ORDER — SODIUM CHLORIDE 0.9 % IV SOLN
INTRAVENOUS | Status: DC
  Administered 2013-05-13: 11:00:00 via INTRAVENOUS

## 2013-05-13 MED ORDER — MIDAZOLAM HCL 5 MG/5ML IJ SOLN
INTRAMUSCULAR | Status: AC
  Filled 2013-05-13: qty 5

## 2013-05-13 MED ORDER — MIDAZOLAM HCL 5 MG/5ML IJ SOLN
INTRAMUSCULAR | Status: DC | PRN
  Administered 2013-05-13 (×2): 1 mg via INTRAVENOUS

## 2013-05-13 MED ORDER — MEPERIDINE HCL 100 MG/ML IJ SOLN
INTRAMUSCULAR | Status: AC
  Filled 2013-05-13: qty 1

## 2013-05-13 MED ORDER — MEPERIDINE HCL 100 MG/ML IJ SOLN
INTRAMUSCULAR | Status: DC | PRN
  Administered 2013-05-13: 25 mg via INTRAVENOUS

## 2013-05-13 MED ORDER — ONDANSETRON HCL 4 MG/2ML IJ SOLN
INTRAMUSCULAR | Status: DC | PRN
  Administered 2013-05-13: 4 mg via INTRAVENOUS

## 2013-05-13 MED ORDER — BUTAMBEN-TETRACAINE-BENZOCAINE 2-2-14 % EX AERO
INHALATION_SPRAY | CUTANEOUS | Status: DC | PRN
  Administered 2013-05-13: 2 via TOPICAL

## 2013-05-13 MED ORDER — ONDANSETRON HCL 4 MG/2ML IJ SOLN
INTRAMUSCULAR | Status: AC
  Filled 2013-05-13: qty 2

## 2013-05-13 NOTE — H&P (Signed)
Primary Care Physician:  Rudi Heap, MD Primary Gastroenterologist:  Dr. Jena Gauss  Pre-Procedure History & Physical: HPI:  Allison Collier is a 79 y.o. female here for EGD for esophageal dilation as appropriate. Patient recently underwent an urgent EGD with removal of a food impaction. She had a mid ulcerated esophageal stricture. Sketchy distant history of esophageal tumor  - resected. She's been stable since her recent EGD.  Past Medical History  Diagnosis Date  . GERD (gastroesophageal reflux disease)   . Thyroid disease   . Hypertension   . Hyperlipidemia   . Osteoporosis   . Cancer     Esophageal  . CAD (coronary artery disease)     Distant MI  . Cardiomyopathy     EF  . Anemia   . Pleural effusion     Past Surgical History  Procedure Laterality Date  . Esophagogatroduodenoscopy      multiple dilatations  . Orif hip fracture  08/31/2011    Procedure: OPEN REDUCTION INTERNAL FIXATION HIP;  Surgeon: Fuller Canada, MD;  Location: [facility] ORS;  Service: Orthopedics;  Laterality: Left;  ORIF/Gamma Nail Left Hip  . Eye surgery    . Coronary angioplasty with stent placement    . Esophagogastroduodenoscopy Left 04/21/2013    Procedure: ESOPHAGOGASTRODUODENOSCOPY (EGD);  Surgeon: Corbin Ade, MD;  Location: [facility] ENDO SUITE;  Service: Endoscopy;  Laterality: Left;    Prior to Admission medications   Medication Sig Start Date End Date Taking? Authorizing Provider  aspirin 81 MG chewable tablet Chew 81 mg by mouth daily.   Yes Historical Provider, MD  furosemide (LASIX) 20 MG tablet Take 20 mg by mouth daily.   Yes Historical Provider, MD  levothyroxine (SYNTHROID, LEVOTHROID) 25 MCG tablet Take 25 mcg by mouth daily before breakfast.   Yes Historical Provider, MD  lisinopril (PRINIVIL,ZESTRIL) 5 MG tablet Take 5 mg by mouth daily.   Yes Historical Provider, MD  metoprolol tartrate (LOPRESSOR) 25 MG tablet Take 12.5 mg by mouth 2 (two) times daily.   Yes Historical Provider, MD   omega-3 acid ethyl esters (LOVAZA) 1 G capsule Take 2 g by mouth daily.   Yes Historical Provider, MD  omeprazole (PRILOSEC) 20 MG capsule Take 20 mg by mouth daily.   Yes Historical Provider, MD  simvastatin (ZOCOR) 40 MG tablet Take 40 mg by mouth daily.   Yes Historical Provider, MD  acetaminophen-codeine (TYLENOL #3) 300-30 MG per tablet Take 1 tablet by mouth every 12 (twelve) hours as needed for pain. (For Moderate pain) take with food do not exceed 4gms of tylenol in 24hrs    Historical Provider, MD  nitroGLYCERIN (NITROSTAT) 0.4 MG SL tablet Place 0.4 mg under the tongue every 5 (five) minutes as needed. For chest pains    Historical Provider, MD    Allergies as of 04/21/2013 - Review Complete 04/21/2013  Allergen Reaction Noted  . Sulfa antibiotics  11/04/2010    History reviewed. No pertinent family history.  History   Social History  . Marital Status: Widowed    Spouse Name: N/A    Number of Children: 7  . Years of Education: N/A   Occupational History  . Not on file.   Social History Main Topics  . Smoking status: Never Smoker   . Smokeless tobacco: Current User    Types: Snuff  . Alcohol Use: No  . Drug Use: No  . Sexual Activity: Not on file   Other Topics Concern  . Not on file  Social History Narrative   Staying at [facility]    Review of Systems: See HPI, otherwise negative ROS  Physical Exam: BP 158/76  Pulse 88  Temp(Src) 97.4 F (36.3 C) (Oral)  Resp 17  Ht 5\' 6"  (1.676 m)  Wt 132 lb (59.875 kg)  BMI 21.32 kg/m2  SpO2 97% General:   Alert,  , elderly, frail pleasant and cooperative in NAD Skin:  Intact without significant lesions or rashes. Eyes:  Sclera clear, no icterus.   Conjunctiva pink. Ears:  Normal auditory acuity. Nose:  No deformity, discharge,  or lesions. Mouth:  No deformity or lesions. Neck:  Supple; no masses or thyromegaly. No significant cervical adenopathy. Lungs:  Clear throughout to auscultation.   No wheezes,  crackles, or rhonchi. No acute distress. Heart:  Regular rate and rhythm; no murmurs, clicks, rubs,  or gallops. Abdomen: Non-distended, normal bowel sounds.  Soft and nontender without appreciable mass or hepatosplenomegaly.  Pulses:  Normal pulses noted. Extremities:  Without clubbing or edema.  Impression:   79 year old lady with recent food impaction. Mid esophageal stricture. Sketchy distant history of esophageal tumor. Here for elective EGD with esophageal dilation as appropriate.The risks, benefits, limitations, alternatives and imponderables have been reviewed with the patient. Potential for esophageal dilation, biopsy, etc. have also been reviewed.  Questions have been answered. All parties agreeable.

## 2013-05-13 NOTE — Op Note (Signed)
[facility] [address] [place], [address]   ENDOSCOPY PROCEDURE REPORT  PATIENT: Allison Collier, Allison Collier  MR#: [id number] BIRTHDATE: Feb 22, 1921 , 89  yrs. old GENDER: Female ENDOSCOPIST: R.  Roetta Sessions, MD FACP [facility] REFERRED BY:  Rudi Heap, M.D. PROCEDURE DATE:  05/13/2013 PROCEDURE:     EGD with Elease Hashimoto dilation  INDICATIONS:     esophageal dysphagia; history of recent food impaction  INFORMED CONSENT:   The risks, benefits, limitations, alternatives and imponderables have been discussed.  The potential for biopsy, esophogeal dilation, etc. have also been reviewed.  Questions have been answered.  All parties agreeable.  Please see the history and physical in the medical record for more information.  MEDICATIONS: Versed 2 mg IV and Demerol 25 mg. Zofran 4 mg IV. Cetacaine spray.  DESCRIPTION OF PROCEDURE:   The EG-[id number] ([id number])  endoscope was introduced through the mouth and advanced to the second portion of the duodenum without difficulty or limitations.  The mucosal surfaces were surveyed very carefully during advancement of the scope and upon withdrawal.  Retroflexion view of the proximal stomach and esophagogastric junction was performed.      FINDINGS: very short benign-appearing fibrotic stricture in the midesophagus (approximately 24 cm in the incisors). Mucosa overlying this area. Apparently normal as did the remainder of the esophagus. I easily traversed with a diagnostic scope. Stomach empty. Her examination-mucosa revealed a small hiatal hernia and a scar consistent with prior gastrostomy tube. There also some verucous changes in the antrum (previously noted and biopsied).  THERAPEUTIC / DIAGNOSTIC MANEUVERS PERFORMED:  A 52 French Maloney dilator was passed to full insertion easily without any resistance, whatsoever. Subsequently, a 9 French Maloney dilator was passed to full insertion easily without any resistance. A look back  revealed the stricture had been nicely dilated with superficial disruption of the mucosa overlying the stricture in a  semilunar distribution. Please see above photo. No apparent complication related to these maneuverw.   COMPLICATIONS:  None  IMPRESSION:   Short mid, benign-appearing stricture most likely secondary to prior radiation - status post dilation as described above. Hiatal hernia. Abnormal gastric mucosa-previously biopsied and proven to be benign    RECOMMENDATIONS:  Continue omeprazole 20 mg daily and Avalide. Gradually advance to a soft/chopped meat diet in the next 24 hours. No further intervention unless recurrent dysphagia symptoms occur    _______________________________ R. Roetta Sessions, MD FACP [facility] eSigned:  R. Roetta Sessions, MD FACP [facility] 05/13/2013 12:50 PM     CC:  PATIENT NAME:  Allison Collier, Allison Collier MR#: [id number]

## 2013-05-15 ENCOUNTER — Encounter (HOSPITAL_COMMUNITY): Payer: Self-pay | Admitting: Internal Medicine

## 2013-06-24 ENCOUNTER — Encounter: Payer: Self-pay | Admitting: Cardiology

## 2013-06-24 ENCOUNTER — Ambulatory Visit (INDEPENDENT_AMBULATORY_CARE_PROVIDER_SITE_OTHER): Payer: Medicare Other | Admitting: Cardiology

## 2013-06-24 VITALS — BP 148/72 | HR 82 | Ht 67.0 in | Wt 142.0 lb

## 2013-06-24 DIAGNOSIS — I5022 Chronic systolic (congestive) heart failure: Secondary | ICD-10-CM

## 2013-06-24 DIAGNOSIS — I509 Heart failure, unspecified: Secondary | ICD-10-CM

## 2013-06-24 NOTE — Progress Notes (Signed)
HPI The patient presents for follow up of a cardiomyopathy with an EF of 30%. She is here for her second visit with me. She apparently is now at [facility] having fallen at home. However, she's not describing any acute cardiovascular complaints and says she did not pass out. I don't have any records. She's not noticing any palpitations and there is no description of any rapid heartbeat that was mentioned previously. She says she's not having any shortness of breath, PND or orthopnea. She's not having any felt since, presyncope. She denies any chest pressure, neck or arm discomfort. She has left hand pain secondary to the fall.   Allergies  Allergen Reactions  . Sulfa Antibiotics     Reaction unknown    Current Outpatient Prescriptions  Medication Sig Dispense Refill  . aspirin 81 MG chewable tablet Chew 81 mg by mouth daily.      . furosemide (LASIX) 20 MG tablet Take 20 mg by mouth daily.      Marland Kitchen levothyroxine (SYNTHROID, LEVOTHROID) 25 MCG tablet Take 25 mcg by mouth daily before breakfast.      . levothyroxine (SYNTHROID, LEVOTHROID) 50 MCG tablet Take 50 mcg by mouth daily before breakfast.      . lisinopril (PRINIVIL,ZESTRIL) 5 MG tablet Take 5 mg by mouth daily.      . metoprolol tartrate (LOPRESSOR) 25 MG tablet Take 12.5 mg by mouth 2 (two) times daily.      . nitroGLYCERIN (NITROSTAT) 0.4 MG SL tablet Place 0.4 mg under the tongue every 5 (five) minutes as needed. For chest pains      . omega-3 acid ethyl esters (LOVAZA) 1 G capsule Take 2 g by mouth daily.      Marland Kitchen omeprazole (PRILOSEC) 20 MG capsule Take 20 mg by mouth daily.      . simvastatin (ZOCOR) 40 MG tablet Take 40 mg by mouth daily.      Marland Kitchen acetaminophen-codeine (TYLENOL #3) 300-30 MG per tablet Take 1 tablet by mouth every 12 (twelve) hours as needed for pain. (For Moderate pain) take with food do not exceed 4gms of tylenol in 24hrs      . ZETIA 10 MG tablet        No current facility-administered medications for this  visit.    Past Medical History  Diagnosis Date  . GERD (gastroesophageal reflux disease)   . Thyroid disease   . Hypertension   . Hyperlipidemia   . Osteoporosis   . Cancer     Esophageal  . CAD (coronary artery disease)     Distant MI  . Cardiomyopathy     EF  . Anemia   . Pleural effusion     Past Surgical History  Procedure Laterality Date  . Esophagogatroduodenoscopy      multiple dilatations  . Orif hip fracture  08/31/2011    Procedure: OPEN REDUCTION INTERNAL FIXATION HIP;  Surgeon: Fuller Canada, MD;  Location: [facility];  Service: Orthopedics;  Laterality: Left;  ORIF/Gamma Nail Left Hip  . Eye surgery    . Coronary angioplasty with stent placement    . Esophagogastroduodenoscopy Left 04/21/2013    Procedure: ESOPHAGOGASTRODUODENOSCOPY (EGD);  Surgeon: Corbin Ade, MD;  Location: [facility];  Service: Endoscopy;  Laterality: Left;  . Esophagogastroduodenoscopy (egd) with esophageal dilation N/A 05/13/2013    Procedure: ESOPHAGOGASTRODUODENOSCOPY (EGD) WITH ESOPHAGEAL DILATION;  Surgeon: Corbin Ade, MD;  Location: [facility];  Service: Endoscopy;  Laterality: N/A;  8:45-rescheduled to 12:30  Soledad Gerlach to notify pt    ROS:  Positive for occasional headaches and dizziness, nausea and vomiting, lower extremity edema. Otherwise as stated in the HPI and negative for all other systems.  PHYSICAL EXAM BP 148/72  Pulse 82  Ht 5\' 7"  (1.702 m)  Wt 142 lb (64.411 kg)  BMI 22.24 kg/m2 GENERAL: Frail appearing NECK:  No jugular venous distention at 90 degrees, waveform within normal limits, carotid upstroke brisk and symmetric, no bruits, no thyromegaly LYMPHATICS:  No cervical, inguinal adenopathy LUNGS:  Few basilar coarse crackles BACK:  No CVA tenderness, mild lordosis HEART:  S1 and S2 within normal limits, no S3, no S4, no clicks, no rubs, soft apical early peaking systolic , no diastolic murmurs ABD:  Flat, positive bowel sounds normal in frequency in pitch,  no bruits, no rebound, no guarding, examined in the wheelchair EXT:  2 plus pulses throughout, mild ankle edema, no cyanosis no clubbing   ASSESSMENT AND PLAN   CAD: She has no overt symptoms of active ischemia. No change in therapy is indicated.  CM:  She does have a moderately reduced ejection fraction but she seems to be euvolemic. He's on a reasonable regimen and with her frail condition I wouldn't titrate medicines. No further workup of this is suggested.  PALPITATIONS:  Since there is no symptomatic suggest tachypalpitations or symptomatic arrhythmia I would not suggest further evaluation.  We will try to contact the nursing home to make sure that there was no history of syncope prior to her recent fall.

## 2013-06-24 NOTE — Patient Instructions (Signed)
The current medical regimen is effective;  continue present plan and medications.  Follow up as needed 

## 2013-12-21 IMAGING — CR DG LUMBAR SPINE COMPLETE 4+V
5 series · 5 of 5 positions shown · non-contrast
Comparison: None

CLINICAL DATA: Back pain.

LUMBAR SPINE - COMPLETE 4+ VIEW

[view not recorded (1 of 5)]
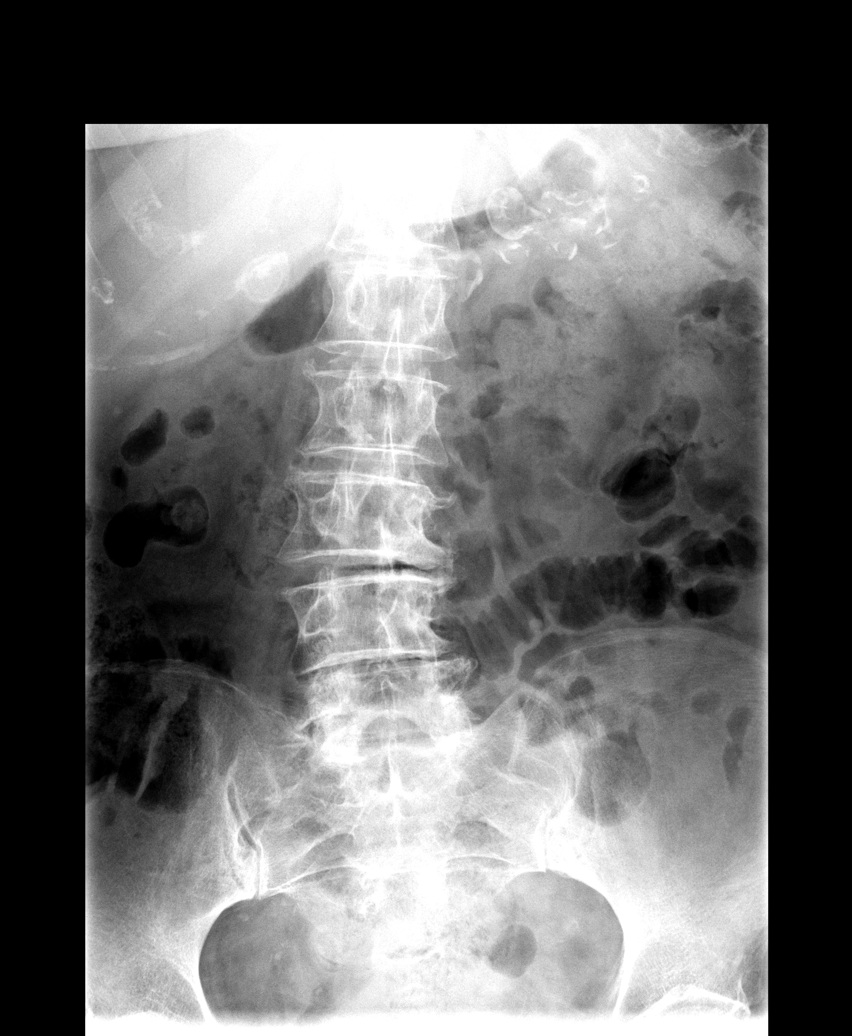

[view not recorded (2 of 5)]
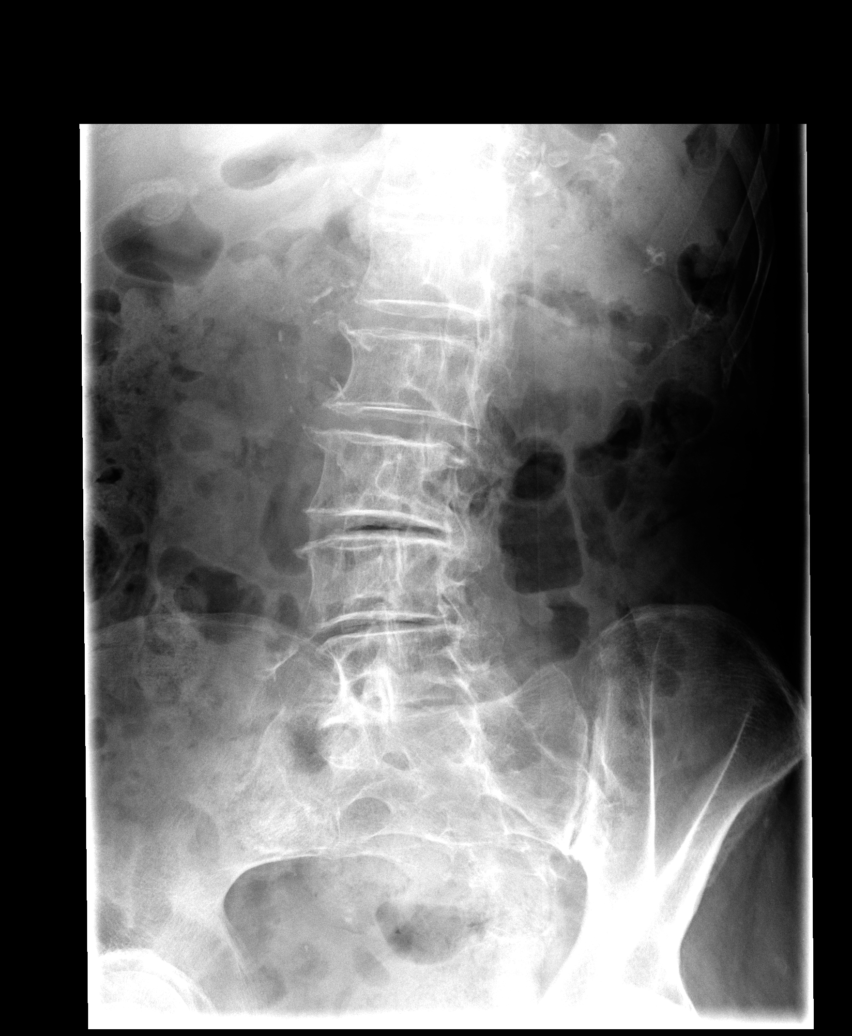

[view not recorded (3 of 5)]
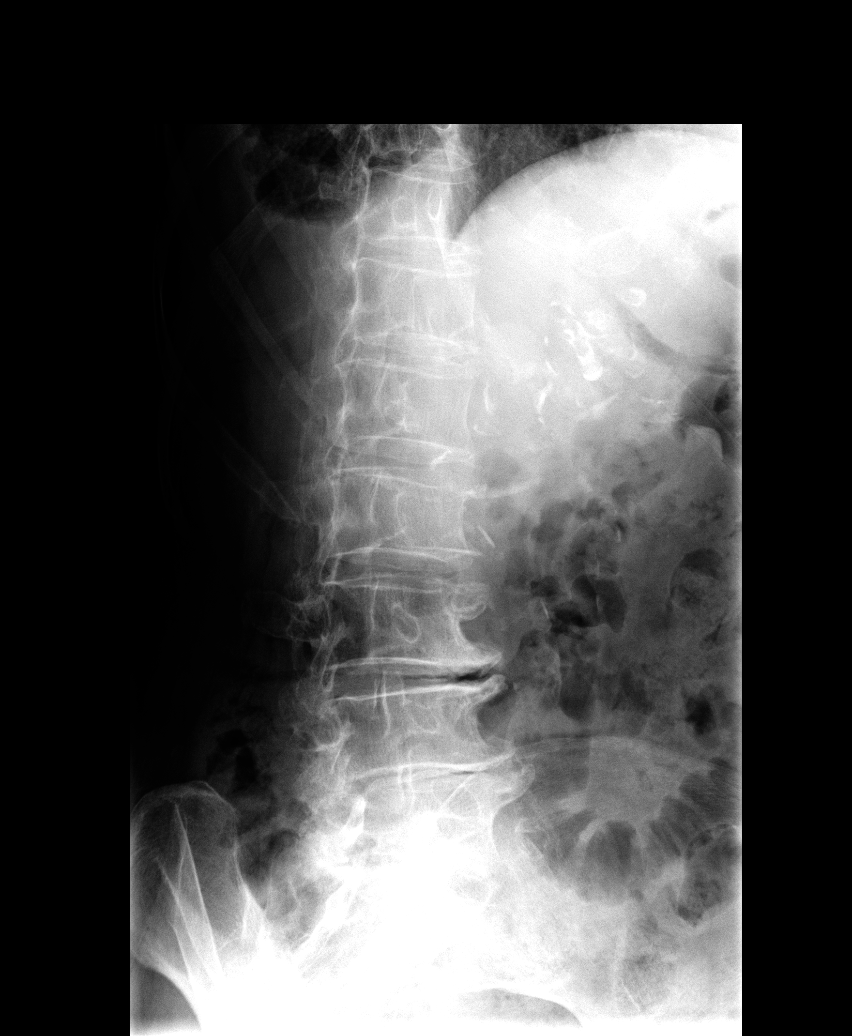

[view not recorded (4 of 5)]
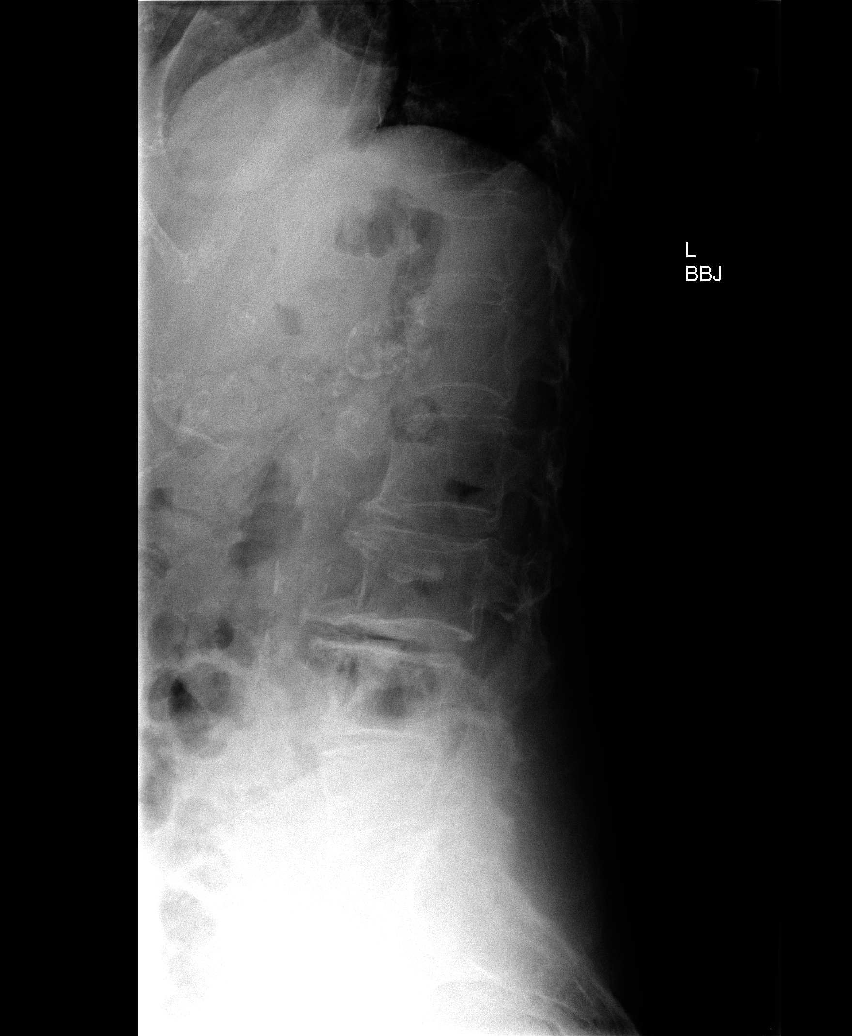

[view not recorded (5 of 5)]
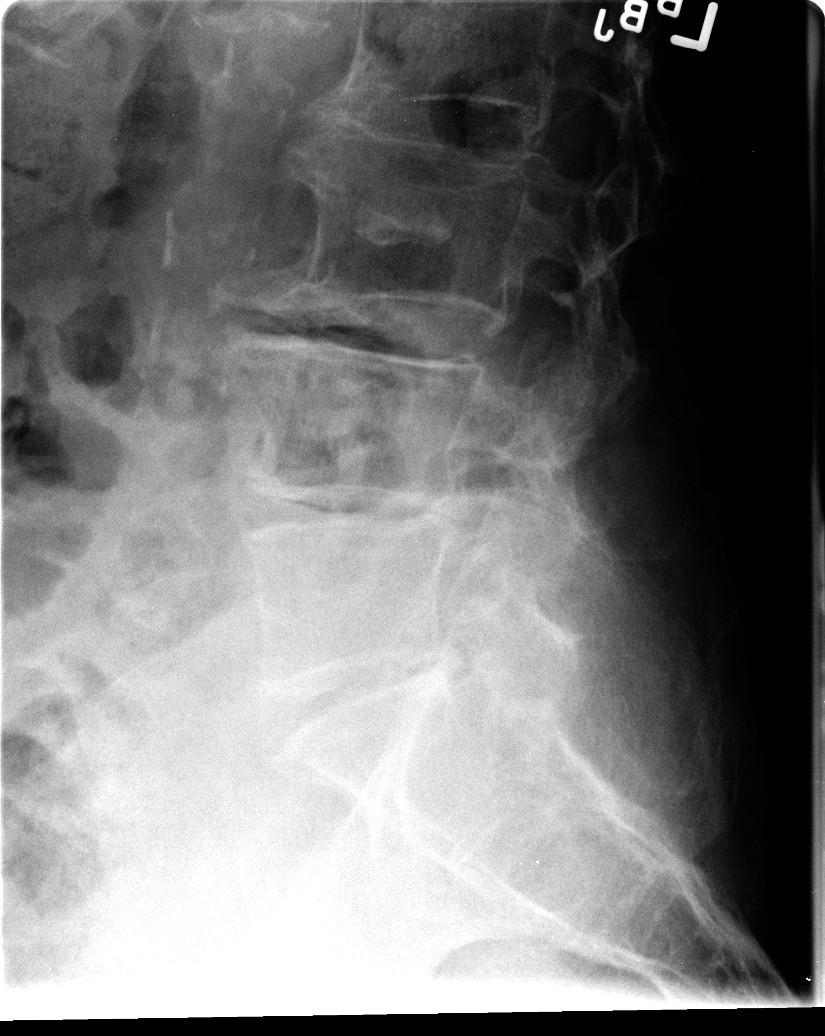

[5 of 5 positions shown; findings below may reference images not displayed]

FINDINGS: There is a mild right convex lumbar scoliosis with
moderate degenerative lumbar spondylosis.  Disc disease and facet
disease is noted without acute fracture.  Extensive vascular
calcifications are noted.  The visualized bony pelvis is intact.
The rounded calcification in the right upper quadrant is likely a
gallstone.
IMPRESSION: Scoliosis and degenerative lumbar spondylosis but no acute
findings.

## 2016-05-30 ENCOUNTER — Encounter: Payer: Self-pay | Admitting: Cardiology

## 2016-06-04 ENCOUNTER — Telehealth: Payer: Self-pay | Admitting: Cardiology

## 2016-06-04 NOTE — Telephone Encounter (Signed)
Received records from [facility] for appointment on 06/12/16 with Dr Percival Spanish.  Records given to [facility] (medical records) for Dr Hochrein's schedule on 06/12/16. lp

## 2016-06-12 ENCOUNTER — Ambulatory Visit: Payer: Medicare Other | Admitting: Cardiology

## 2016-06-12 NOTE — Progress Notes (Signed)
HPI The patient presents for follow up of a cardiomyopathy with an EF of 30%. She is here for her third visit.  I have not seen her since Nov 2014.   She comes back today apparently for evaluation of palpitations. I don't have any notes on this. However, she gives a description of her heart racing occasionally probably twice at night perhaps accompanied by some shortness of breath. This seems to happen when she's having abdominal problems and she has a lot of these come. She thinks she's had trouble with her gallbladder. She has some constipation. She says that when this hurts she gets short of breath or might have her heart racing. It is not clear from her description whether her heart races independent of this. She doesn't describe PND or orthopnea. She doesn't describe presyncope or syncope. She denies chest pressure, neck or arm discomfort.   She lives at a nursing home. She gets around slowly and came today in a wheelchair although she was able to transition to the table.  Allergies  Allergen Reactions  . Sulfa Antibiotics     Reaction unknown    Current Outpatient Prescriptions  Medication Sig Dispense Refill  . acetaminophen (TYLENOL) 650 MG suppository Place 650 mg rectally every 4 (four) hours as needed for mild pain.    Marland Kitchen aspirin 81 MG chewable tablet Chew 81 mg by mouth daily.    . Cholecalciferol 50000 units TABS Take 50,000 Units by mouth every 30 (thirty) days.    . furosemide (LASIX) 20 MG tablet Take 20 mg by mouth daily.    Marland Kitchen levothyroxine (SYNTHROID, LEVOTHROID) 125 MCG tablet Take 125 mcg by mouth daily before breakfast.    . Melatonin 3 MG CAPS Take 3 mg by mouth at bedtime.    . multivitamin-lutein (OCUVITE-LUTEIN) CAPS capsule Take 1 capsule by mouth 2 (two) times daily.    . nitroGLYCERIN (NITROSTAT) 0.4 MG SL tablet Place 0.4 mg under the tongue every 5 (five) minutes as needed. For chest pains    . Polyethyl Glycol-Propyl Glycol (SYSTANE) 0.4-0.3 % GEL ophthalmic gel  Place 1 application into both eyes 4 (four) times daily.    . polyethylene glycol (MIRALAX / GLYCOLAX) packet Take 17 g by mouth every other day.    . simvastatin (ZOCOR) 40 MG tablet Take 40 mg by mouth daily.     No current facility-administered medications for this visit.     Past Medical History:  Diagnosis Date  . Anemia   . Arthritis   . CAD (coronary artery disease)    Distant MI  . Cancer (HCC)    Esophageal  . Cardiomyopathy ([place])    EF  . CKD (chronic kidney disease) stage 2, GFR 60-89 ml/min   . Dementia   . GERD (gastroesophageal reflux disease)   . Hyperlipidemia   . Hypertension   . Hypothyroidism   . Osteoporosis   . Pleural effusion   . Thyroid disease     Past Surgical History:  Procedure Laterality Date  . CORONARY ANGIOPLASTY WITH STENT PLACEMENT    . ESOPHAGOGASTRODUODENOSCOPY Left 04/21/2013   Procedure: ESOPHAGOGASTRODUODENOSCOPY (EGD);  Surgeon: Daneil Dolin, MD;  Location: [facility];  Service: Endoscopy;  Laterality: Left;  . ESOPHAGOGASTRODUODENOSCOPY (EGD) WITH ESOPHAGEAL DILATION N/A 05/13/2013   Procedure: ESOPHAGOGASTRODUODENOSCOPY (EGD) WITH ESOPHAGEAL DILATION;  Surgeon: Daneil Dolin, MD;  Location: [facility];  Service: Endoscopy;  Laterality: N/A;  8:45-rescheduled to 12:30 Darius Bump to notify pt  . Esophagogatroduodenoscopy  multiple dilatations  . EYE SURGERY    . ORIF HIP FRACTURE  08/31/2011   Procedure: OPEN REDUCTION INTERNAL FIXATION HIP;  Surgeon: Arther Abbott, MD;  Location: [facility];  Service: Orthopedics;  Laterality: Left;  ORIF/Gamma Nail Left Hip    ROS:   Otherwise as stated in the HPI and negative for all other systems.  PHYSICAL EXAM BP 126/80   Pulse 74   Ht 5\' 7"  (1.702 m)   Wt 160 lb (72.6 kg)   BMI 25.06 kg/m  GENERAL: Frail appearing NECK:  No jugular venous distention at 90 degrees, waveform within normal limits, carotid upstroke brisk and symmetric, no bruits, no thyromegaly LYMPHATICS:  No  cervical, inguinal adenopathy LUNGS:  Few basilar coarse crackles BACK:  No CVA tenderness, mild lordosis HEART:  S1 and S2 within normal limits, no S3, no S4, no clicks, no rubs, soft apical early peaking systolic , no diastolic murmurs ABD:  Flat, positive bowel sounds normal in frequency in pitch, no bruits, no rebound, no guarding, examined in the wheelchair EXT:  2 plus pulses throughout, mild ankle edema, no cyanosis no clubbing  EKG:  Sinus rhythm, rate 74, leftward axis, borderline interventricular conduction delay, no acute ST-T wave changes.  06/13/2016   ASSESSMENT AND PLAN   CAD: She has no overt symptoms of active ischemia. No change in therapy is indicated.  CM:  She does have a moderately reduced ejection fraction but she seems to be euvolemic. He's on a reasonable regimen and with her frail condition I wouldn't titrate medicines. No further workup of this is suggested.  PALPITATIONS:  It is hard to sort out these symptoms and whether they are only related to her GI complaints. I will plan a 48 hour Holter.

## 2016-06-13 ENCOUNTER — Ambulatory Visit (INDEPENDENT_AMBULATORY_CARE_PROVIDER_SITE_OTHER): Payer: Medicare Other | Admitting: Cardiology

## 2016-06-13 ENCOUNTER — Encounter: Payer: Self-pay | Admitting: Cardiology

## 2016-06-13 VITALS — BP 126/80 | HR 74 | Ht 67.0 in | Wt 160.0 lb

## 2016-06-13 DIAGNOSIS — R002 Palpitations: Secondary | ICD-10-CM | POA: Diagnosis not present

## 2016-06-13 NOTE — Patient Instructions (Addendum)
Medication Instructions:  The current medical regimen is effective;  continue present plan and medications.  Testing/Procedures: Your physician has recommended that you wear a holter monitor for 48 hours. Holter monitors are medical devices that record the heart's electrical activity. Doctors most often use these monitors to diagnose arrhythmias. Arrhythmias are problems with the speed or rhythm of the heartbeat. The monitor is a small, portable device. You can wear one while you do your normal daily activities. This is usually used to diagnose what is causing palpitations/syncope (passing out). Kay/Kathy [phone] at [facility]  Follow-Up: Follow up as needed with Dr Percival Spanish.  Thank you for choosing [facility]!!

## 2016-06-27 ENCOUNTER — Ambulatory Visit (INDEPENDENT_AMBULATORY_CARE_PROVIDER_SITE_OTHER): Payer: Medicare Other

## 2016-06-27 DIAGNOSIS — R002 Palpitations: Secondary | ICD-10-CM | POA: Diagnosis not present

## 2016-07-24 ENCOUNTER — Telehealth: Payer: Self-pay | Admitting: Pediatrics

## 2016-07-24 MED ORDER — METOPROLOL SUCCINATE ER 25 MG PO TB24: 25.0000 mg | ORAL_TABLET | Freq: Every day | ORAL | 3 refills | Status: DC

## 2016-07-24 NOTE — Telephone Encounter (Signed)
-----   Message from Vennie Homans sent at 07/16/2016  4:30 PM EST -----   ----- Message ----- From: Minus Breeding, MD Sent: 07/11/2016   5:51 PM To: Vennie Homans  She does have some palpitations and might feel better with metoprolol XL 25 mg nightly.  Call Ms. Supinski with the results and send results to Evelina Dun, FNP

## 2016-07-24 NOTE — Telephone Encounter (Signed)
I called and spoke to [place], Ms. [facility] nurse @ [address]  I advised of 48 hr Holter results and medication change.  She asked that I fax the result to her @ [phone]. Metoprolol sent to [facility] in [place]. Note also forwarded to Evelina Dun, [place].

## 2016-07-25 ENCOUNTER — Telehealth: Payer: Self-pay | Admitting: Cardiology

## 2016-07-25 MED ORDER — METOPROLOL SUCCINATE ER 25 MG PO TB24: 25.0000 mg | ORAL_TABLET | Freq: Every day | ORAL | 3 refills | Status: AC

## 2016-07-25 NOTE — Telephone Encounter (Signed)
Returned call to [place] after sending fax, she's aware fax was sent and will call if problems. Voiced thanks for response and assistance.

## 2016-07-25 NOTE — Telephone Encounter (Signed)
Bandy with [facility] [facility] calling regarding rx for Metoprolol ordered by Dr. Percival Spanish. They need an order for this faxed to them in order to give the med to her. Fax- [phone]

## 2017-06-08 ENCOUNTER — Inpatient Hospital Stay (HOSPITAL_COMMUNITY)
Admission: EM | Admit: 2017-06-08 | Discharge: 2017-06-17 | DRG: 189 | Disposition: A | Discharge status: Hospice | Payer: Medicare Other | Attending: Family Medicine | Admitting: Family Medicine

## 2017-06-08 ENCOUNTER — Emergency Department (HOSPITAL_COMMUNITY): Payer: Medicare Other

## 2017-06-08 ENCOUNTER — Encounter (HOSPITAL_COMMUNITY): Payer: Self-pay | Admitting: *Deleted

## 2017-06-08 DIAGNOSIS — J9601 Acute respiratory failure with hypoxia: Secondary | ICD-10-CM | POA: Diagnosis not present

## 2017-06-08 DIAGNOSIS — E871 Hypo-osmolality and hyponatremia: Secondary | ICD-10-CM | POA: Diagnosis not present

## 2017-06-08 DIAGNOSIS — Z955 Presence of coronary angioplasty implant and graft: Secondary | ICD-10-CM

## 2017-06-08 DIAGNOSIS — E870 Hyperosmolality and hypernatremia: Secondary | ICD-10-CM | POA: Diagnosis not present

## 2017-06-08 DIAGNOSIS — I429 Cardiomyopathy, unspecified: Secondary | ICD-10-CM | POA: Diagnosis present

## 2017-06-08 DIAGNOSIS — B961 Klebsiella pneumoniae [K. pneumoniae] as the cause of diseases classified elsewhere: Secondary | ICD-10-CM | POA: Diagnosis present

## 2017-06-08 DIAGNOSIS — Z515 Encounter for palliative care: Secondary | ICD-10-CM | POA: Diagnosis not present

## 2017-06-08 DIAGNOSIS — M810 Age-related osteoporosis without current pathological fracture: Secondary | ICD-10-CM | POA: Diagnosis present

## 2017-06-08 DIAGNOSIS — I10 Essential (primary) hypertension: Secondary | ICD-10-CM | POA: Diagnosis present

## 2017-06-08 DIAGNOSIS — I4891 Unspecified atrial fibrillation: Secondary | ICD-10-CM | POA: Diagnosis not present

## 2017-06-08 DIAGNOSIS — Z7189 Other specified counseling: Secondary | ICD-10-CM

## 2017-06-08 DIAGNOSIS — F05 Delirium due to known physiological condition: Secondary | ICD-10-CM | POA: Diagnosis not present

## 2017-06-08 DIAGNOSIS — F17290 Nicotine dependence, other tobacco product, uncomplicated: Secondary | ICD-10-CM | POA: Diagnosis present

## 2017-06-08 DIAGNOSIS — Z79890 Hormone replacement therapy (postmenopausal): Secondary | ICD-10-CM

## 2017-06-08 DIAGNOSIS — K219 Gastro-esophageal reflux disease without esophagitis: Secondary | ICD-10-CM | POA: Diagnosis present

## 2017-06-08 DIAGNOSIS — R0682 Tachypnea, not elsewhere classified: Secondary | ICD-10-CM | POA: Diagnosis not present

## 2017-06-08 DIAGNOSIS — I130 Hypertensive heart and chronic kidney disease with heart failure and stage 1 through stage 4 chronic kidney disease, or unspecified chronic kidney disease: Secondary | ICD-10-CM | POA: Diagnosis present

## 2017-06-08 DIAGNOSIS — D696 Thrombocytopenia, unspecified: Secondary | ICD-10-CM | POA: Diagnosis present

## 2017-06-08 DIAGNOSIS — E039 Hypothyroidism, unspecified: Secondary | ICD-10-CM | POA: Diagnosis present

## 2017-06-08 DIAGNOSIS — I252 Old myocardial infarction: Secondary | ICD-10-CM

## 2017-06-08 DIAGNOSIS — G9349 Other encephalopathy: Secondary | ICD-10-CM | POA: Diagnosis present

## 2017-06-08 DIAGNOSIS — G9340 Encephalopathy, unspecified: Secondary | ICD-10-CM | POA: Diagnosis present

## 2017-06-08 DIAGNOSIS — Z8501 Personal history of malignant neoplasm of esophagus: Secondary | ICD-10-CM

## 2017-06-08 DIAGNOSIS — R1311 Dysphagia, oral phase: Secondary | ICD-10-CM | POA: Diagnosis present

## 2017-06-08 DIAGNOSIS — N183 Chronic kidney disease, stage 3 (moderate): Secondary | ICD-10-CM | POA: Diagnosis present

## 2017-06-08 DIAGNOSIS — Z7982 Long term (current) use of aspirin: Secondary | ICD-10-CM

## 2017-06-08 DIAGNOSIS — I080 Rheumatic disorders of both mitral and aortic valves: Secondary | ICD-10-CM | POA: Diagnosis present

## 2017-06-08 DIAGNOSIS — F0390 Unspecified dementia without behavioral disturbance: Secondary | ICD-10-CM | POA: Diagnosis present

## 2017-06-08 DIAGNOSIS — E785 Hyperlipidemia, unspecified: Secondary | ICD-10-CM | POA: Diagnosis present

## 2017-06-08 DIAGNOSIS — Z79899 Other long term (current) drug therapy: Secondary | ICD-10-CM

## 2017-06-08 DIAGNOSIS — I5022 Chronic systolic (congestive) heart failure: Secondary | ICD-10-CM | POA: Diagnosis present

## 2017-06-08 DIAGNOSIS — I447 Left bundle-branch block, unspecified: Secondary | ICD-10-CM | POA: Diagnosis present

## 2017-06-08 DIAGNOSIS — J811 Chronic pulmonary edema: Secondary | ICD-10-CM | POA: Diagnosis present

## 2017-06-08 DIAGNOSIS — Z23 Encounter for immunization: Secondary | ICD-10-CM

## 2017-06-08 DIAGNOSIS — R1312 Dysphagia, oropharyngeal phase: Secondary | ICD-10-CM | POA: Diagnosis present

## 2017-06-08 DIAGNOSIS — R1313 Dysphagia, pharyngeal phase: Secondary | ICD-10-CM | POA: Diagnosis present

## 2017-06-08 DIAGNOSIS — N390 Urinary tract infection, site not specified: Secondary | ICD-10-CM | POA: Diagnosis present

## 2017-06-08 DIAGNOSIS — Z882 Allergy status to sulfonamides status: Secondary | ICD-10-CM

## 2017-06-08 DIAGNOSIS — Z66 Do not resuscitate: Secondary | ICD-10-CM | POA: Diagnosis present

## 2017-06-08 DIAGNOSIS — I2510 Atherosclerotic heart disease of native coronary artery without angina pectoris: Secondary | ICD-10-CM | POA: Diagnosis present

## 2017-06-08 LAB — CBC WITH DIFFERENTIAL/PLATELET
BASOS ABS: 0 10*3/uL (ref 0.0–0.1)
BASOS PCT: 0 %
EOS ABS: 0 10*3/uL (ref 0.0–0.7)
EOS PCT: 0 %
HCT: 36.2 % (ref 36.0–46.0)
Hemoglobin: 12 g/dL (ref 12.0–15.0)
Lymphocytes Relative: 4 %
Lymphs Abs: 0.3 10*3/uL — ABNORMAL LOW (ref 0.7–4.0)
MCH: 29.5 pg (ref 26.0–34.0)
MCHC: 33.1 g/dL (ref 30.0–36.0)
MCV: 88.9 fL (ref 78.0–100.0)
Monocytes Absolute: 0.2 10*3/uL (ref 0.1–1.0)
Monocytes Relative: 3 %
Neutro Abs: 6.5 10*3/uL (ref 1.7–7.7)
Neutrophils Relative %: 92 %
PLATELETS: DECREASED 10*3/uL (ref 150–400)
RBC: 4.07 MIL/uL (ref 3.87–5.11)
RDW: 15.7 % — ABNORMAL HIGH (ref 11.5–15.5)
WBC: 7 10*3/uL (ref 4.0–10.5)

## 2017-06-08 LAB — URINALYSIS, ROUTINE W REFLEX MICROSCOPIC
Bilirubin Urine: NEGATIVE
GLUCOSE, UA: NEGATIVE mg/dL
KETONES UR: 5 mg/dL — AB
NITRITE: NEGATIVE
PH: 6 (ref 5.0–8.0)
PROTEIN: 30 mg/dL — AB
Specific Gravity, Urine: 1.015 (ref 1.005–1.030)

## 2017-06-08 LAB — CBG MONITORING, ED: GLUCOSE-CAPILLARY: 113 mg/dL — AB (ref 65–99)

## 2017-06-08 LAB — COMPREHENSIVE METABOLIC PANEL WITH GFR
ALT: 16 U/L (ref 14–54)
AST: 31 U/L (ref 15–41)
Albumin: 3.1 g/dL — ABNORMAL LOW (ref 3.5–5.0)
Alkaline Phosphatase: 86 U/L (ref 38–126)
Anion gap: 10 (ref 5–15)
BUN: 32 mg/dL — ABNORMAL HIGH (ref 6–20)
CO2: 27 mmol/L (ref 22–32)
Calcium: 9.1 mg/dL (ref 8.9–10.3)
Chloride: 107 mmol/L (ref 101–111)
Creatinine, Ser: 1.25 mg/dL — ABNORMAL HIGH (ref 0.44–1.00)
GFR calc Af Amer: 41 mL/min — ABNORMAL LOW
GFR calc non Af Amer: 36 mL/min — ABNORMAL LOW
Glucose, Bld: 131 mg/dL — ABNORMAL HIGH (ref 65–99)
Potassium: 4.6 mmol/L (ref 3.5–5.1)
Sodium: 144 mmol/L (ref 135–145)
Total Bilirubin: 1.4 mg/dL — ABNORMAL HIGH (ref 0.3–1.2)
Total Protein: 6.6 g/dL (ref 6.5–8.1)

## 2017-06-08 LAB — BLOOD GAS, VENOUS
ACID-BASE EXCESS: 2.3 mmol/L — AB (ref 0.0–2.0)
Bicarbonate: 24.6 mmol/L (ref 20.0–28.0)
O2 CONTENT: 2 L/min
O2 Saturation: 31 %
PH VEN: 7.371 (ref 7.250–7.430)
pCO2, Ven: 47.5 mmHg (ref 44.0–60.0)

## 2017-06-08 MED ORDER — SODIUM CHLORIDE 0.9 % IV BOLUS (SEPSIS)
1000.0000 mL | Freq: Once | INTRAVENOUS | Status: AC
  Administered 2017-06-08: 1000 mL via INTRAVENOUS

## 2017-06-08 NOTE — ED Triage Notes (Signed)
Pt brought in by [facility] from [facility]; pt brought in for altered loc; staff reported to ems that pt fell yesterday and today pt has had decreased loc; pt only able to state her name; pt given tylenol suppository sometime today

## 2017-06-08 NOTE — ED Notes (Signed)
O2 applied via [place] at 2 L/M, sats increased to 94%

## 2017-06-08 NOTE — ED Notes (Signed)
Pt had an IV 24 G inserted backwards to left AC, IV site removed and catheter intact. bandaid applied.

## 2017-06-09 ENCOUNTER — Encounter (HOSPITAL_COMMUNITY): Payer: Self-pay

## 2017-06-09 DIAGNOSIS — Z23 Encounter for immunization: Secondary | ICD-10-CM | POA: Diagnosis present

## 2017-06-09 DIAGNOSIS — I429 Cardiomyopathy, unspecified: Secondary | ICD-10-CM | POA: Diagnosis present

## 2017-06-09 DIAGNOSIS — I252 Old myocardial infarction: Secondary | ICD-10-CM | POA: Diagnosis not present

## 2017-06-09 DIAGNOSIS — N183 Chronic kidney disease, stage 3 (moderate): Secondary | ICD-10-CM | POA: Diagnosis present

## 2017-06-09 DIAGNOSIS — Z515 Encounter for palliative care: Secondary | ICD-10-CM | POA: Diagnosis not present

## 2017-06-09 DIAGNOSIS — Z882 Allergy status to sulfonamides status: Secondary | ICD-10-CM | POA: Diagnosis not present

## 2017-06-09 DIAGNOSIS — I130 Hypertensive heart and chronic kidney disease with heart failure and stage 1 through stage 4 chronic kidney disease, or unspecified chronic kidney disease: Secondary | ICD-10-CM | POA: Diagnosis present

## 2017-06-09 DIAGNOSIS — K219 Gastro-esophageal reflux disease without esophagitis: Secondary | ICD-10-CM | POA: Diagnosis present

## 2017-06-09 DIAGNOSIS — J9601 Acute respiratory failure with hypoxia: Secondary | ICD-10-CM | POA: Diagnosis not present

## 2017-06-09 DIAGNOSIS — G9349 Other encephalopathy: Secondary | ICD-10-CM | POA: Diagnosis present

## 2017-06-09 DIAGNOSIS — R0682 Tachypnea, not elsewhere classified: Secondary | ICD-10-CM | POA: Diagnosis not present

## 2017-06-09 DIAGNOSIS — E785 Hyperlipidemia, unspecified: Secondary | ICD-10-CM

## 2017-06-09 DIAGNOSIS — I340 Nonrheumatic mitral (valve) insufficiency: Secondary | ICD-10-CM | POA: Diagnosis not present

## 2017-06-09 DIAGNOSIS — F05 Delirium due to known physiological condition: Secondary | ICD-10-CM | POA: Diagnosis not present

## 2017-06-09 DIAGNOSIS — N390 Urinary tract infection, site not specified: Secondary | ICD-10-CM | POA: Diagnosis not present

## 2017-06-09 DIAGNOSIS — Z7189 Other specified counseling: Secondary | ICD-10-CM | POA: Diagnosis not present

## 2017-06-09 DIAGNOSIS — D696 Thrombocytopenia, unspecified: Secondary | ICD-10-CM | POA: Diagnosis present

## 2017-06-09 DIAGNOSIS — J811 Chronic pulmonary edema: Secondary | ICD-10-CM | POA: Diagnosis present

## 2017-06-09 DIAGNOSIS — I5022 Chronic systolic (congestive) heart failure: Secondary | ICD-10-CM | POA: Diagnosis not present

## 2017-06-09 DIAGNOSIS — E870 Hyperosmolality and hypernatremia: Secondary | ICD-10-CM | POA: Diagnosis not present

## 2017-06-09 DIAGNOSIS — I361 Nonrheumatic tricuspid (valve) insufficiency: Secondary | ICD-10-CM | POA: Diagnosis not present

## 2017-06-09 DIAGNOSIS — E039 Hypothyroidism, unspecified: Secondary | ICD-10-CM | POA: Diagnosis present

## 2017-06-09 DIAGNOSIS — Z66 Do not resuscitate: Secondary | ICD-10-CM | POA: Diagnosis present

## 2017-06-09 DIAGNOSIS — E871 Hypo-osmolality and hyponatremia: Secondary | ICD-10-CM | POA: Diagnosis not present

## 2017-06-09 DIAGNOSIS — G9340 Encephalopathy, unspecified: Secondary | ICD-10-CM | POA: Diagnosis not present

## 2017-06-09 DIAGNOSIS — M810 Age-related osteoporosis without current pathological fracture: Secondary | ICD-10-CM | POA: Diagnosis present

## 2017-06-09 DIAGNOSIS — I4891 Unspecified atrial fibrillation: Secondary | ICD-10-CM | POA: Diagnosis not present

## 2017-06-09 DIAGNOSIS — B961 Klebsiella pneumoniae [K. pneumoniae] as the cause of diseases classified elsewhere: Secondary | ICD-10-CM | POA: Diagnosis present

## 2017-06-09 DIAGNOSIS — I2510 Atherosclerotic heart disease of native coronary artery without angina pectoris: Secondary | ICD-10-CM | POA: Diagnosis present

## 2017-06-09 DIAGNOSIS — I10 Essential (primary) hypertension: Secondary | ICD-10-CM | POA: Diagnosis not present

## 2017-06-09 DIAGNOSIS — F0390 Unspecified dementia without behavioral disturbance: Secondary | ICD-10-CM | POA: Diagnosis present

## 2017-06-09 LAB — CBC
HEMATOCRIT: 34.9 % — AB (ref 36.0–46.0)
HEMOGLOBIN: 11.3 g/dL — AB (ref 12.0–15.0)
MCH: 29.2 pg (ref 26.0–34.0)
MCHC: 32.4 g/dL (ref 30.0–36.0)
MCV: 90.2 fL (ref 78.0–100.0)
Platelets: 73 10*3/uL — ABNORMAL LOW (ref 150–400)
RBC: 3.87 MIL/uL (ref 3.87–5.11)
RDW: 15.8 % — ABNORMAL HIGH (ref 11.5–15.5)
WBC: 4.6 10*3/uL (ref 4.0–10.5)

## 2017-06-09 LAB — COMPREHENSIVE METABOLIC PANEL
ALK PHOS: 79 U/L (ref 38–126)
ALT: 18 U/L (ref 14–54)
ANION GAP: 9 (ref 5–15)
AST: 30 U/L (ref 15–41)
Albumin: 2.7 g/dL — ABNORMAL LOW (ref 3.5–5.0)
BILIRUBIN TOTAL: 0.9 mg/dL (ref 0.3–1.2)
BUN: 32 mg/dL — ABNORMAL HIGH (ref 6–20)
CALCIUM: 8.2 mg/dL — AB (ref 8.9–10.3)
CO2: 23 mmol/L (ref 22–32)
CREATININE: 0.95 mg/dL (ref 0.44–1.00)
Chloride: 110 mmol/L (ref 101–111)
GFR calc non Af Amer: 50 mL/min — ABNORMAL LOW (ref >60)
GFR, EST AFRICAN AMERICAN: 58 mL/min — AB (ref >60)
Glucose, Bld: 126 mg/dL — ABNORMAL HIGH (ref 65–99)
Potassium: 3.5 mmol/L (ref 3.5–5.1)
Sodium: 142 mmol/L (ref 135–145)
TOTAL PROTEIN: 5.9 g/dL — AB (ref 6.5–8.1)

## 2017-06-09 LAB — MRSA PCR SCREENING: MRSA by PCR: NEGATIVE

## 2017-06-09 LAB — BRAIN NATRIURETIC PEPTIDE: B Natriuretic Peptide: 1040 pg/mL — ABNORMAL HIGH (ref 0.0–100.0)

## 2017-06-09 MED ORDER — NITROGLYCERIN 2 % TD OINT
1.0000 [in_us] | TOPICAL_OINTMENT | Freq: Four times a day (QID) | TRANSDERMAL | Status: DC
Start: 2017-06-09 — End: 2017-06-09

## 2017-06-09 MED ORDER — ORAL CARE MOUTH RINSE
15.0000 mL | Freq: Two times a day (BID) | OROMUCOSAL | Status: DC
  Administered 2017-06-09 – 2017-06-17 (×11): 15 mL via OROMUCOSAL

## 2017-06-09 MED ORDER — DEXTROSE 5 % IV SOLN
1.0000 g | INTRAVENOUS | Status: AC
  Administered 2017-06-09 – 2017-06-13 (×5): 1 g via INTRAVENOUS
  Filled 2017-06-09 (×5): qty 10

## 2017-06-09 MED ORDER — MORPHINE SULFATE (PF) 2 MG/ML IV SOLN
2.0000 mg | Freq: Once | INTRAVENOUS | Status: AC
  Administered 2017-06-09: 2 mg via INTRAVENOUS

## 2017-06-09 MED ORDER — IPRATROPIUM-ALBUTEROL 0.5-2.5 (3) MG/3ML IN SOLN
3.0000 mL | Freq: Three times a day (TID) | RESPIRATORY_TRACT | Status: DC
  Administered 2017-06-10: 3 mL via RESPIRATORY_TRACT
  Filled 2017-06-09: qty 3

## 2017-06-09 MED ORDER — MORPHINE SULFATE (PF) 2 MG/ML IV SOLN
INTRAVENOUS | Status: AC
  Filled 2017-06-09: qty 1

## 2017-06-09 MED ORDER — SODIUM CHLORIDE 0.9 % IV SOLN
INTRAVENOUS | Status: DC
  Administered 2017-06-09: 04:00:00 via INTRAVENOUS

## 2017-06-09 MED ORDER — ACETAMINOPHEN 650 MG RE SUPP
650.0000 mg | RECTAL | Status: DC | PRN
  Administered 2017-06-16 – 2017-06-17 (×2): 650 mg via RECTAL
  Filled 2017-06-09 (×2): qty 1

## 2017-06-09 MED ORDER — FUROSEMIDE 10 MG/ML IJ SOLN: 40.0000 mg | Freq: Two times a day (BID) | INTRAMUSCULAR | Status: DC

## 2017-06-09 MED ORDER — HYDRALAZINE HCL 20 MG/ML IJ SOLN: 5.0000 mg | Freq: Four times a day (QID) | INTRAMUSCULAR | Status: DC | PRN

## 2017-06-09 MED ORDER — CHLORHEXIDINE GLUCONATE 0.12 % MT SOLN
15.0000 mL | Freq: Two times a day (BID) | OROMUCOSAL | Status: DC
Start: 2017-06-09 — End: 2017-06-17
  Administered 2017-06-10 – 2017-06-17 (×6): 15 mL via OROMUCOSAL
  Filled 2017-06-09 (×8): qty 15

## 2017-06-09 MED ORDER — ONDANSETRON HCL 4 MG/2ML IJ SOLN: 4.0000 mg | Freq: Four times a day (QID) | INTRAMUSCULAR | Status: DC | PRN

## 2017-06-09 MED ORDER — GUAIFENESIN ER 600 MG PO TB12
1200.0000 mg | ORAL_TABLET | Freq: Two times a day (BID) | ORAL | Status: DC
  Administered 2017-06-09 – 2017-06-13 (×4): 1200 mg via ORAL
  Administered 2017-06-16: 600 mg via ORAL
  Filled 2017-06-09 (×6): qty 2

## 2017-06-09 MED ORDER — VITAMIN D (ERGOCALCIFEROL) 1.25 MG (50000 UNIT) PO CAPS: 50000.0000 [IU] | ORAL_CAPSULE | ORAL | Status: DC

## 2017-06-09 MED ORDER — POLYVINYL ALCOHOL 1.4 % OP SOLN
1.0000 [drp] | Freq: Four times a day (QID) | OPHTHALMIC | Status: DC
  Administered 2017-06-09 – 2017-06-17 (×25): 1 [drp] via OPHTHALMIC
  Filled 2017-06-09 (×2): qty 15

## 2017-06-09 MED ORDER — ASPIRIN 81 MG PO CHEW
81.0000 mg | CHEWABLE_TABLET | Freq: Every day | ORAL | Status: DC
  Administered 2017-06-10 – 2017-06-11 (×2): 81 mg via ORAL
  Filled 2017-06-09 (×4): qty 1

## 2017-06-09 MED ORDER — ENOXAPARIN SODIUM 40 MG/0.4ML ~~LOC~~ SOLN
40.0000 mg | SUBCUTANEOUS | Status: DC
  Administered 2017-06-09: 40 mg via SUBCUTANEOUS
  Filled 2017-06-09: qty 0.4

## 2017-06-09 MED ORDER — POLYETHYLENE GLYCOL 3350 17 G PO PACK
17.0000 g | PACK | ORAL | Status: DC
  Administered 2017-06-11: 17 g via ORAL
  Filled 2017-06-09 (×4): qty 1

## 2017-06-09 MED ORDER — FUROSEMIDE 10 MG/ML IJ SOLN
20.0000 mg | Freq: Once | INTRAMUSCULAR | Status: AC
  Administered 2017-06-09: 20 mg via INTRAVENOUS
  Filled 2017-06-09: qty 2

## 2017-06-09 MED ORDER — INFLUENZA VAC SPLIT HIGH-DOSE 0.5 ML IM SUSY
0.5000 mL | PREFILLED_SYRINGE | INTRAMUSCULAR | Status: AC
  Administered 2017-06-10: 0.5 mL via INTRAMUSCULAR
  Filled 2017-06-09: qty 0.5

## 2017-06-09 MED ORDER — LEVOTHYROXINE SODIUM 50 MCG PO TABS
125.0000 ug | ORAL_TABLET | Freq: Every day | ORAL | Status: DC
  Administered 2017-06-10 – 2017-06-14 (×3): 125 ug via ORAL
  Filled 2017-06-09 (×5): qty 3

## 2017-06-09 MED ORDER — METOPROLOL SUCCINATE ER 25 MG PO TB24
25.0000 mg | ORAL_TABLET | Freq: Every day | ORAL | Status: DC
  Administered 2017-06-10: 25 mg via ORAL
  Filled 2017-06-09: qty 1

## 2017-06-09 MED ORDER — IPRATROPIUM-ALBUTEROL 0.5-2.5 (3) MG/3ML IN SOLN
3.0000 mL | Freq: Four times a day (QID) | RESPIRATORY_TRACT | Status: DC
  Administered 2017-06-09 (×2): 3 mL via RESPIRATORY_TRACT
  Filled 2017-06-09 (×2): qty 3

## 2017-06-09 MED ORDER — POLYETHYL GLYCOL-PROPYL GLYCOL 0.4-0.3 % OP GEL
1.0000 "application " | Freq: Four times a day (QID) | OPHTHALMIC | Status: DC
  Filled 2017-06-09: qty 10

## 2017-06-09 MED ORDER — DEXTROSE 5 % IV SOLN
1.0000 g | Freq: Once | INTRAVENOUS | Status: AC
  Administered 2017-06-09: 1 g via INTRAVENOUS
  Filled 2017-06-09: qty 10

## 2017-06-09 MED ORDER — ONDANSETRON HCL 4 MG PO TABS: 4.0000 mg | ORAL_TABLET | Freq: Four times a day (QID) | ORAL | Status: DC | PRN

## 2017-06-09 MED ORDER — SIMVASTATIN 10 MG PO TABS
40.0000 mg | ORAL_TABLET | Freq: Every day | ORAL | Status: DC
  Administered 2017-06-10 – 2017-06-11 (×2): 40 mg via ORAL
  Filled 2017-06-09 (×4): qty 4

## 2017-06-09 NOTE — ED Notes (Signed)
Pt suctioned, has gurgling respirations and moderate amount of phlegm removed during suctioning. Dr Darrick Meigs aware- this nurse request that he call family as pt appears to be declining. DNR at bedside.

## 2017-06-09 NOTE — Progress Notes (Signed)
Patient admitted to the hospital earlier this morning by Dr. Darrick Meigs.  Patient seen and examined.  Appears to be resting comfortably.  It was reported that she was agitated earlier today, but has improved after family has arrived.  Family describes she was brought to the hospital with fever and change in mental status.  Patient has coarse breath sounds bilaterally.  She has mild lower extremity edema  Patient has been admitted to the hospital with what appears to be a urinary tract infection.  She has been started on intravenous antibiotics.  She is noted to have thrombocytopenia which may be related to her underlying infectious process.  Continue to monitor.  Regarding her respiratory failure, she has a new oxygen requirement.  Prior echo indicates EF of 30-35%.  Echo will be repeated since this was performed several years ago.  She received a dose of intravenous Lasix in the emergency room, but also received 2 L of fluid.  At this time, she does not appear to be particularly volume overloaded.  Chest exam reveals shortness of breath may be related to increased pulmonary secretions.  Will start on bronchodilators and mucolytics.  Continue to wean down oxygen as tolerated.  Allison Collier

## 2017-06-09 NOTE — Progress Notes (Signed)
PT offered Po fluids. PT became strangled and had difficulty swallowing small amount of fluids. MD notified. Pt now NPO. Continue to monitor and HOB up at 45 degrees

## 2017-06-09 NOTE — H&P (Signed)
TRH H&P    Patient Demographics:    Allison Collier, is a 83 y.o. female  MRN: [id number]  DOB - Aug 20, 1921  Admit Date - 06/08/2017  Referring MD/NP/PA: Dr. Erin Hearing  Outpatient Primary MD for the patient is Junie Spencer, FNP  Patient coming from: Skilled nursing facility  Chief Complaint  Patient presents with  . Altered Mental Status      HPI:    Allison Collier  is a 83 y.o. female, with history of hypertension, hypothyroidism, hyperlipidemia, chronic systolic CHF, CAD was brought to the hospital from [facility] for altered mental status.  No other history obtainable.  Apparently patient fell yesterday at [facility] clinic. In the ED she was found to have UTI and started on ceftriaxone.  Also given 2 L of IV normal saline. Chest x-ray showed increased pulmonary vascular congestion  At this time patient is in respiratory distress, with increased secretions. She is unable to provide any history    Review of systems:     Unobtainable as patient is unable to provide any history at this time.   With Past History of the following :    Past Medical History:  Diagnosis Date  . Anemia   . Arthritis   . CAD (coronary artery disease)    Distant MI  . Cancer (HCC)    Esophageal  . Cardiomyopathy (HCC)    EF  . CKD (chronic kidney disease) stage 2, GFR 60-89 ml/min   . Dementia   . GERD (gastroesophageal reflux disease)   . Hyperlipidemia   . Hypertension   . Hypothyroidism   . Osteoporosis   . Pleural effusion   . Thyroid disease       Past Surgical History:  Procedure Laterality Date  . CORONARY ANGIOPLASTY WITH STENT PLACEMENT    . ESOPHAGOGASTRODUODENOSCOPY Left 04/21/2013   Procedure: ESOPHAGOGASTRODUODENOSCOPY (EGD);  Surgeon: Corbin Ade, MD;  [facility];  Service: Endoscopy;  Laterality: Left;  . ESOPHAGOGASTRODUODENOSCOPY (EGD) WITH ESOPHAGEAL DILATION N/A  05/13/2013   Procedure: ESOPHAGOGASTRODUODENOSCOPY (EGD) WITH ESOPHAGEAL DILATION;  Surgeon: Corbin Ade, MD;  [facility];  Service: Endoscopy;  Laterality: N/A;  8:45-rescheduled to 12:30 Soledad Gerlach to notify pt  . Esophagogatroduodenoscopy     multiple dilatations  . EYE SURGERY    . ORIF HIP FRACTURE  08/31/2011   Procedure: OPEN REDUCTION INTERNAL FIXATION HIP;  Surgeon: Fuller Canada, MD;  Location: [facility];  Service: Orthopedics;  Laterality: Left;  ORIF/Gamma Nail Left Hip      Social History:      Social History  Substance Use Topics  . Smoking status: Never Smoker  . Smokeless tobacco: Current User    Types: Snuff  . Alcohol use No       Family History :   Unobtainable due to patient's worsening respiratory status   Home Medications:   Prior to Admission medications   Medication Sig Start Date End Date Taking? Authorizing Provider  acetaminophen (TYLENOL) 650 MG suppository Place 650 mg rectally every 4 (four) hours  as needed for mild pain.    [provider]  aspirin 81 MG chewable tablet Chew 81 mg by mouth daily.    [provider]  Cholecalciferol 50000 units TABS Take 50,000 Units by mouth every 30 (thirty) days.    [provider]  furosemide (LASIX) 20 MG tablet Take 20 mg by mouth daily.    [provider]  levothyroxine (SYNTHROID, LEVOTHROID) 125 MCG tablet Take 125 mcg by mouth daily before breakfast.    [provider]  Melatonin 3 MG CAPS Take 3 mg by mouth at bedtime.    [provider]  metoprolol succinate (TOPROL XL) 25 MG 24 hr tablet Take 1 tablet (25 mg total) by mouth at bedtime. 07/25/16   Rollene Rotunda, MD  multivitamin-lutein (OCUVITE-LUTEIN) CAPS capsule Take 1 capsule by mouth 2 (two) times daily.    [provider]  nitroGLYCERIN (NITROSTAT) 0.4 MG SL tablet Place 0.4 mg under the tongue every 5 (five) minutes as needed. For chest pains    [provider]    Polyethyl Glycol-Propyl Glycol (SYSTANE) 0.4-0.3 % GEL ophthalmic gel Place 1 application into both eyes 4 (four) times daily.    [provider]  polyethylene glycol (MIRALAX / GLYCOLAX) packet Take 17 g by mouth every other day.    [provider]  simvastatin (ZOCOR) 40 MG tablet Take 40 mg by mouth daily.    [provider]     Allergies:     Allergies  Allergen Reactions  . Sulfa Antibiotics     Reaction unknown     Physical Exam:   Vitals  Blood pressure (!) 189/90, pulse (!) 114, temperature 98.3 F (36.8 C), temperature source Rectal, resp. rate (!) 25, SpO2 (!) 85 %.  1.  General: Tachypneic, audible secretions  2. Psychiatric: Alert, not oriented x3  3. Neurologic: Moving all extremities  4. Eyes :  anicteric sclerae, moist conjunctivae with no lid lag. PERRLA.  5. ENMT:  Oropharynx clear with moist mucous membranes and good dentition  6. Neck:  supple, no cervical lymphadenopathy appriciated, No thyromegaly  7. Respiratory : Bilateral rhonchi auscultated  8. Cardiovascular : RRR, no gallops, rubs or murmurs, no leg edema  9. Gastrointestinal:  Positive bowel sounds, abdomen soft, non-tender to palpation,no hepatosplenomegaly, no rigidity or guarding       10. Skin:  No cyanosis, normal texture and turgor, no rash, lesions or ulcers  11.Musculoskeletal:  Good muscle tone,  joints appear normal , no effusions,  normal range of motion    Data Review:    CBC  Recent Labs Lab 06/08/17 2134  WBC 7.0  HGB 12.0  HCT 36.2  PLT PLATELETS APPEAR DECREASED  MCV 88.9  MCH 29.5  MCHC 33.1  RDW 15.7*  LYMPHSABS 0.3*  MONOABS 0.2  EOSABS 0.0  BASOSABS 0.0   ------------------------------------------------------------------------------------------------------------------  Chemistries   Recent Labs Lab 06/08/17 2133  NA 144  K 4.6  CL 107  CO2 27  GLUCOSE 131*  BUN 32*  CREATININE 1.25*  CALCIUM 9.1  AST  31  ALT 16  ALKPHOS 86  BILITOT 1.4*   ------------------------------------------------------------------------------------------------------------------  ------------------------------------------------------------------------------------------------------------------ GFR: CrCl cannot be calculated (Unknown ideal weight.). Liver Function Tests:  Recent Labs Lab 06/08/17 2133  AST 31  ALT 16  ALKPHOS 86  BILITOT 1.4*  PROT 6.6  ALBUMIN 3.1*   No results for input(s): LIPASE, AMYLASE in the last 168 hours. No results for input(s): AMMONIA in the last 168  hours. Coagulation Profile: No results for input(s): INR, PROTIME in the last 168 hours. Cardiac Enzymes: No results for input(s): CKTOTAL, CKMB, CKMBINDEX, TROPONINI in the last 168 hours. BNP (last 3 results) No results for input(s): PROBNP in the last 8760 hours. HbA1C: No results for input(s): HGBA1C in the last 72 hours. CBG:  Recent Labs Lab 06/08/17 2131  GLUCAP 113*   Lipid Profile: No results for input(s): CHOL, HDL, LDLCALC, TRIG, CHOLHDL, LDLDIRECT in the last 72 hours. Thyroid Function Tests: No results for input(s): TSH, T4TOTAL, FREET4, T3FREE, THYROIDAB in the last 72 hours. Anemia Panel: No results for input(s): VITAMINB12, FOLATE, FERRITIN, TIBC, IRON, RETICCTPCT in the last 72 hours.  --------------------------------------------------------------------------------------------------------------- Urine analysis:    Component Value Date/Time   COLORURINE AMBER (A) 06/08/2017 2257   APPEARANCEUR HAZY (A) 06/08/2017 2257   LABSPEC 1.015 06/08/2017 2257   PHURINE 6.0 06/08/2017 2257   GLUCOSEU NEGATIVE 06/08/2017 2257   HGBUR LARGE (A) 06/08/2017 2257   BILIRUBINUR NEGATIVE 06/08/2017 2257   KETONESUR 5 (A) 06/08/2017 2257   PROTEINUR 30 (A) 06/08/2017 2257   UROBILINOGEN 0.2 04/19/2013 2221   NITRITE NEGATIVE 06/08/2017 2257   LEUKOCYTESUR LARGE (A) 06/08/2017 2257      Imaging Results:     Dg Chest 1 View  Result Date: 06/08/2017 CLINICAL DATA:  Tachypnea and hypoxia with altered level level of consciousness. EXAM: CHEST 1 VIEW COMPARISON:  04/19/2013 FINDINGS: AP upright view of the chest demonstrates cardiomegaly with aortic arteriosclerosis. Blunting of the right costophrenic angle appears chronic and may reflect pleural thickening versus a small right effusion. There is central pulmonary vascular congestion and redistribution consistent with mild CHF. No acute nor suspicious osseous abnormality. IMPRESSION: 1. Cardiomegaly with aortic atherosclerosis. 2. Mild central vascular congestion consistent with mild CHF. 3. Blunting of the right costophrenic angle may reflect pleural thickening or small right effusion. Electronically Signed   By: Tollie Eth M.D.   On: 06/08/2017 23:05   Ct Head Wo Contrast  Result Date: 06/08/2017 CLINICAL DATA:  Initial evaluation for acute altered mental status. Recent fall. EXAM: CT HEAD WITHOUT CONTRAST TECHNIQUE: Contiguous axial images were obtained from the base of the skull through the vertex without intravenous contrast. COMPARISON:  Prior CT from 01/14/2013. FINDINGS: Brain: Generalized age related cerebral atrophy. Moderate chronic small vessel ischemic disease. Remote lacunar infarct present within the right thalamus. Small remote left cerebellar infarct. No acute intracranial hemorrhage. No evidence for acute large vessel territory infarct. No mass lesion, midline shift or mass effect. No hydrocephalus. No extra-axial fluid collection. Vascular: No hyperdense vessel. Scattered vascular calcifications noted within the carotid siphons. Skull: Scalp soft tissues and calvarium within normal limits. Sinuses/Orbits: Globes and orbital soft tissues normal. Patient status post lens extraction bilaterally. Visualized paranasal sinuses are clear. No mastoid effusion. Other: None. IMPRESSION: 1. No acute intracranial abnormality. 2. Remote right thalamic  lacunar infarct with additional small remote left cerebellar infarct. 3. Generalized age related cerebral atrophy with moderate chronic small vessel ischemic disease. Electronically Signed   By: Rise Mu M.D.   On: 06/08/2017 23:49    My personal review of EKG: Rhythm NSR   Assessment & Plan:    Active Problems:   Acute respiratory failure (HCC)   UTI  1. Acute respiratory failure-likely from worsening pulmonary edema.  Patient has increased secretions, with suction as needed.  1 dose of Lasix given in the ED 20 mg IV.  Will also give 1 dose of morphine 2 mg for worsening  respiratory status. 2. UTI-patient has abnormal urine.  Will obtain urine culture,  started on ceftriaxone.  We will continue ceftriaxone per pharmacy consultation. 3. Hypertension-blood pressure is elevated, continue Toprol XL. 4. Hypothyroidism-continue Synthroid   DVT Prophylaxis-   Lovenox   AM Labs Ordered, also please review Full Orders  Family Communication: Admission, patients condition and plan of care including tests being ordered have been discussed with the patient' daughter Olegario Messier little who indicate understanding and agree with the plan and Code Status.  Patient is a DNR.  Also explained to her the patient may not survive this hospitalization.  She is struggling to breathe.  Patient's daughter agrees to use morphine as needed for shortness of breath.  Code Status: DNR  Admission status: Inpatient  Time spent in minutes : 60 minutes   Bonni Neuser S M.D on 06/09/2017 at 2:18 AM  Between 7am to 7pm - Pager - [phone]. After 7pm go to [email or web address] - password [facility]  [facility] - Office  [phone]

## 2017-06-09 NOTE — Progress Notes (Signed)
Pharmacy Antibiotic Note  Allison Collier is a 83 y.o. female admitted on 06/08/2017 with UTI.  Pharmacy has been consulted for Ceftriaxone dosing. Ceftriaxone 1 GM IV given in ED  Plan: Ceftriaxone 1 GM IV every 24 hours F/U cultures, LOT Labs per protocol  Height: 6' (182.9 cm) Weight: 153 lb 14.1 oz (69.8 kg) IBW/kg (Calculated) : 73.1  Temp (24hrs), Avg:98.2 F (36.8 C), Min:97.7 F (36.5 C), Max:98.5 F (36.9 C)   Recent Labs Lab 06/08/17 2133 06/08/17 2134  WBC  --  7.0  CREATININE 1.25*  --     Estimated Creatinine Clearance: 30.3 mL/min (A) (by C-G formula based on SCr of 1.25 mg/dL (H)).    Allergies  Allergen Reactions  . Sulfa Antibiotics     Reaction unknown    Antimicrobials this admission: Ceftriaxone  10/21 >>    >>   Dose adjustments this admission:   Microbiology results:  BCx:   UCx:    Sputum:    MRSA PCR:   Thank you for allowing pharmacy to be a part of this patient's care.  Chriss Czar 06/09/2017 7:43 AM

## 2017-06-09 NOTE — ED Notes (Signed)
Pt suctioned, has gurgling respirations and moderate amount of phlegm removed during suctioning.

## 2017-06-10 ENCOUNTER — Inpatient Hospital Stay (HOSPITAL_COMMUNITY): Payer: Medicare Other

## 2017-06-10 DIAGNOSIS — I361 Nonrheumatic tricuspid (valve) insufficiency: Secondary | ICD-10-CM

## 2017-06-10 DIAGNOSIS — G9340 Encephalopathy, unspecified: Secondary | ICD-10-CM | POA: Diagnosis present

## 2017-06-10 DIAGNOSIS — I340 Nonrheumatic mitral (valve) insufficiency: Secondary | ICD-10-CM

## 2017-06-10 LAB — BASIC METABOLIC PANEL
ANION GAP: 7 (ref 5–15)
BUN: 36 mg/dL — AB (ref 6–20)
CALCIUM: 8.7 mg/dL — AB (ref 8.9–10.3)
CO2: 27 mmol/L (ref 22–32)
Chloride: 114 mmol/L — ABNORMAL HIGH (ref 101–111)
Creatinine, Ser: 0.91 mg/dL (ref 0.44–1.00)
GFR calc Af Amer: >60 mL/min (ref >60)
GFR, EST NON AFRICAN AMERICAN: 52 mL/min — AB (ref >60)
GLUCOSE: 119 mg/dL — AB (ref 65–99)
Potassium: 3.9 mmol/L (ref 3.5–5.1)
Sodium: 148 mmol/L — ABNORMAL HIGH (ref 135–145)

## 2017-06-10 LAB — CBC
HCT: 35.2 % — ABNORMAL LOW (ref 36.0–46.0)
Hemoglobin: 11.2 g/dL — ABNORMAL LOW (ref 12.0–15.0)
MCH: 29.1 pg (ref 26.0–34.0)
MCHC: 31.8 g/dL (ref 30.0–36.0)
MCV: 91.4 fL (ref 78.0–100.0)
Platelets: 77 10*3/uL — ABNORMAL LOW (ref 150–400)
RBC: 3.85 MIL/uL — ABNORMAL LOW (ref 3.87–5.11)
RDW: 15.9 % — AB (ref 11.5–15.5)
WBC: 3.9 10*3/uL — ABNORMAL LOW (ref 4.0–10.5)

## 2017-06-10 LAB — ECHOCARDIOGRAM COMPLETE
Height: 72 in
Weight: 2462.1 oz

## 2017-06-10 MED ORDER — IPRATROPIUM-ALBUTEROL 0.5-2.5 (3) MG/3ML IN SOLN
3.0000 mL | Freq: Two times a day (BID) | RESPIRATORY_TRACT | Status: DC
  Administered 2017-06-10: 3 mL via RESPIRATORY_TRACT
  Filled 2017-06-10: qty 3

## 2017-06-10 MED ORDER — PERFLUTREN LIPID MICROSPHERE
1.0000 mL | INTRAVENOUS | Status: AC | PRN
  Administered 2017-06-10: 2 mL via INTRAVENOUS
  Filled 2017-06-10: qty 10

## 2017-06-10 NOTE — Evaluation (Signed)
Clinical/Bedside Swallow Evaluation Patient Details  Name: Allison Collier MRN: [id number] Date of Birth: Jan 25, 1921  Today's Date: 06/10/2017 Time: SLP Start Time (ACUTE ONLY): 1223 SLP Stop Time (ACUTE ONLY): 1258 SLP Time Calculation (min) (ACUTE ONLY): 35 min  Past Medical History:  Past Medical History:  Diagnosis Date  . Anemia   . Arthritis   . CAD (coronary artery disease)    Distant MI  . Cancer (HCC)    Esophageal  . Cardiomyopathy (HCC)    EF  . CKD (chronic kidney disease) stage 2, GFR 60-89 ml/min   . Dementia   . GERD (gastroesophageal reflux disease)   . Hyperlipidemia   . Hypertension   . Hypothyroidism   . Osteoporosis   . Pleural effusion   . Thyroid disease    Past Surgical History:  Past Surgical History:  Procedure Laterality Date  . CORONARY ANGIOPLASTY WITH STENT PLACEMENT    . ESOPHAGOGASTRODUODENOSCOPY Left 04/21/2013   Procedure: ESOPHAGOGASTRODUODENOSCOPY (EGD);  Surgeon: Corbin Ade, MD;  [facility];  Service: Endoscopy;  Laterality: Left;  . ESOPHAGOGASTRODUODENOSCOPY (EGD) WITH ESOPHAGEAL DILATION N/A 05/13/2013   Procedure: ESOPHAGOGASTRODUODENOSCOPY (EGD) WITH ESOPHAGEAL DILATION;  Surgeon: Corbin Ade, MD;  [facility];  Service: Endoscopy;  Laterality: N/A;  8:45-rescheduled to 12:30 Soledad Gerlach to notify pt  . Esophagogatroduodenoscopy     multiple dilatations  . EYE SURGERY    . ORIF HIP FRACTURE  08/31/2011   Procedure: OPEN REDUCTION INTERNAL FIXATION HIP;  Surgeon: Fuller Canada, MD;  Location: [facility];  Service: Orthopedics;  Laterality: Left;  ORIF/Gamma Nail Left Hip   HPI:  MargaretHicksis a 83 y.o.female,with history of hypertension, hypothyroidism, hyperlipidemia, chronic systolic CHF, CAD was brought to the hospital from [facility] for altered mental status. No other history obtainable. Apparently patient fell yesterday at [facility] clinic. In the ED she was found to have UTI and started on  ceftriaxone. Also given 2 L of IV normal saline.Chest x-ray showed increased pulmonary vascular congestion. At this time patient is in respiratory distress, with increased secretions. Family reports Pt with PMH significant for esophageal cancer treated with chemo/rad years ago and need for esophageal dilation in the past with Dr. Jena Gauss. BSE ordered. Head CT shows: Remote right thalamic lacunar infarct with additional small remote left cerebellar infarct.   Assessment / Plan / Recommendation Clinical Impression  Clinical swallow evaluation completed with family in room. Pt received reclined in bed, head back, and open mouth posture breathing. Oral care completed and thick, dried secretions from posterior oral cavity removed. Pt unable to cough on command, but does attempt. Lingual pumping only noted with trials of single ice chips. Pt with occasional immediate coughing post swallow with sips of thin liquids (tsp, cup, straw). Improved oral control noted with NTL and purees. Recommend D1/puree with NTL and consider MBSS tomorrow pending clinical course. Above to Pt's daughters, nurse, and MD. SLP to follow during acute stay.   SLP Visit Diagnosis: Dysphagia, oropharyngeal phase (R13.12)    Aspiration Risk  Moderate aspiration risk    Diet Recommendation Dysphagia 1 (Puree);Nectar-thick liquid   Liquid Administration via: Cup;Straw Medication Administration: Crushed with puree Supervision: Patient able to self feed;Full supervision/cueing for compensatory strategies Compensations: Slow rate;Small sips/bites Postural Changes: Seated upright at 90 degrees;Remain upright for at least 30 minutes after po intake    Other  Recommendations Oral Care Recommendations: Staff/trained caregiver to provide oral care;Oral care before and after PO Other Recommendations: Order thickener from  pharmacy;Clarify dietary restrictions   Follow up Recommendations 24 hour supervision/assistance;Home health SLP       Frequency and Duration min 2x/week  1 week       Prognosis Prognosis for Safe Diet Advancement: Good Barriers to Reach Goals: Cognitive deficits Barriers/Prognosis Comment: posture in bed      Swallow Study   General Date of Onset: 06/08/17 HPI: MargaretHicksis a 83 y.o.female,with history of hypertension, hypothyroidism, hyperlipidemia, chronic systolic CHF, CAD was brought to the hospital from [facility] for altered mental status. No other history obtainable. Apparently patient fell yesterday at [facility] clinic. In the ED she was found to have UTI and started on ceftriaxone. Also given 2 L of IV normal saline.Chest x-ray showed increased pulmonary vascular congestion. At this time patient is in respiratory distress, with increased secretions. Family reports Pt with PMH significant for esophageal cancer treated with chemo/rad years ago and need for esophageal dilation in the past with Dr. Jena Gauss. BSE ordered. Head CT shows: Remote right thalamic lacunar infarct with additional small remote left cerebellar infarct. Type of Study: Bedside Swallow Evaluation Previous Swallow Assessment: none on record Diet Prior to this Study: NPO (previously was D2/thin) Temperature Spikes Noted: No Respiratory Status: Nasal cannula History of Recent Intubation: No Behavior/Cognition: Alert;Cooperative;Pleasant mood Oral Cavity Assessment: Dry;Dried secretions Oral Care Completed by SLP: Yes Oral Cavity - Dentition: Edentulous Vision: Functional for self-feeding Self-Feeding Abilities: Able to feed self;Needs set up Patient Positioning: Upright in bed (SLP repositioned with pillows due to cervical lordosis) Baseline Vocal Quality: Low vocal intensity Volitional Cough: Cognitively unable to elicit Volitional Swallow: Unable to elicit    Oral/Motor/Sensory Function Overall Oral Motor/Sensory Function: Mild impairment Facial ROM: Within Functional Limits Facial Symmetry: Within Functional  Limits Facial Strength: Reduced right;Reduced left Facial Sensation: Within Functional Limits Lingual ROM: Within Functional Limits Lingual Symmetry: Within Functional Limits Lingual Strength: Reduced Lingual Sensation: Within Functional Limits Velum: Within Functional Limits Mandible: Within Functional Limits   Ice Chips Ice chips: Impaired Presentation: Spoon Pharyngeal Phase Impairments:  (lingual pumping only) Other Comments: improved once oral cavity moist   Thin Liquid Thin Liquid: Impaired Presentation: Cup;Self Fed;Straw Oral Phase Impairments: Reduced lingual movement/coordination Pharyngeal  Phase Impairments: Suspected delayed Swallow;Decreased hyoid-laryngeal movement;Cough - Immediate;Cough - Delayed    Nectar Thick Nectar Thick Liquid: Impaired Presentation: Cup;Straw;Spoon Oral Phase Impairments: Reduced lingual movement/coordination Pharyngeal Phase Impairments: Suspected delayed Swallow   Honey Thick Honey Thick Liquid: Not tested   Puree Puree: Within functional limits Presentation: Spoon   Solid   Thank you,  Havery Moros, CCC-SLP [phone]    Solid: Not tested        Lanisha Stepanian 06/10/2017,1:04 PM

## 2017-06-10 NOTE — ED Provider Notes (Signed)
[facility] Provider Note   CSN: [id number] Arrival date & time: 06/08/17  2120     History   Chief Complaint Chief Complaint  Patient presents with  . Altered Mental Status    HPI Allison Collier is a 83 y.o. female.   Altered Mental Status   This is a new problem. The current episode started yesterday. Associated symptoms include confusion and somnolence.    Past Medical History:  Diagnosis Date  . Anemia   . Arthritis   . CAD (coronary artery disease)    Distant MI  . Cancer (HCC)    Esophageal  . Cardiomyopathy ([place])    EF  . CKD (chronic kidney disease) stage 2, GFR 60-89 ml/min   . Dementia   . GERD (gastroesophageal reflux disease)   . Hyperlipidemia   . Hypertension   . Hypothyroidism   . Osteoporosis   . Pleural effusion   . Thyroid disease     Patient Active Problem List   Diagnosis Date Noted  . Acute respiratory failure ([place]) 06/09/2017  . Thrombocytopenia ([place]) 06/09/2017  . Hypernatremia 04/19/2013  . Dysphagia 04/19/2013  . CAP (community acquired pneumonia) 01/16/2013  . Hydropneumothorax 01/16/2013  . Chronic systolic CHF (congestive heart failure) ([place]) 01/16/2013  . Fx metacarpal 01/15/2013  . Pleural effusion on right 01/14/2013  . Metacarpal bone fracture 01/14/2013  . Fall 01/14/2013  . Shingles outbreak 11/21/2012  . Lower urinary tract infectious disease 09/03/2011  . Closed left hip fracture ([place]) 08/30/2011  . Anemia 08/30/2011  . HTN (hypertension) 11/04/2010  . Hyperlipidemia 11/04/2010  . Osteoarthritis 11/04/2010  . GERD (gastroesophageal reflux disease) 11/04/2010  . Osteopenia 11/04/2010  . CAD (coronary artery disease) 11/04/2010  . Hypothyroid 11/04/2010  . Chronic back pain 11/04/2010  . Esophageal cancer ([place]) 11/04/2010    Past Surgical History:  Procedure Laterality Date  . CORONARY ANGIOPLASTY WITH STENT PLACEMENT    . ESOPHAGOGASTRODUODENOSCOPY Left 04/21/2013   Procedure:  ESOPHAGOGASTRODUODENOSCOPY (EGD);  Surgeon: Daneil Dolin, MD;  Location: [facility];  Service: Endoscopy;  Laterality: Left;  . ESOPHAGOGASTRODUODENOSCOPY (EGD) WITH ESOPHAGEAL DILATION N/A 05/13/2013   Procedure: ESOPHAGOGASTRODUODENOSCOPY (EGD) WITH ESOPHAGEAL DILATION;  Surgeon: Daneil Dolin, MD;  Location: [facility];  Service: Endoscopy;  Laterality: N/A;  8:45-rescheduled to 12:30 Darius Bump to notify pt  . Esophagogatroduodenoscopy     multiple dilatations  . EYE SURGERY    . ORIF HIP FRACTURE  08/31/2011   Procedure: OPEN REDUCTION INTERNAL FIXATION HIP;  Surgeon: Arther Abbott, MD;  Location: [facility];  Service: Orthopedics;  Laterality: Left;  ORIF/Gamma Nail Left Hip    OB History    No data available       Home Medications    Prior to Admission medications   Medication Sig Start Date End Date Taking? Authorizing Provider  aspirin 81 MG chewable tablet Chew 81 mg by mouth daily.   Yes [provider]  Cholecalciferol 50000 units TABS Take 50,000 Units by mouth every 30 (thirty) days.   Yes [provider]  furosemide (LASIX) 20 MG tablet Take 20 mg by mouth daily.   Yes [provider]  levothyroxine (SYNTHROID, LEVOTHROID) 125 MCG tablet Take 125 mcg by mouth daily before breakfast.   Yes [provider]  Melatonin 3 MG CAPS Take 3 mg by mouth at bedtime.   Yes [provider]  Menthol, Topical Analgesic, 5 % GEL Apply 1 application topically every 4 (four) hours.  Yes [provider]  metoprolol succinate (TOPROL XL) 25 MG 24 hr tablet Take 1 tablet (25 mg total) by mouth at bedtime. 07/25/16  Yes Minus Breeding, MD  multivitamin-lutein [facility]) CAPS capsule Take 1 capsule by mouth 2 (two) times daily.   Yes [provider]  nitroGLYCERIN (NITROSTAT) 0.4 MG SL tablet Place 0.4 mg under the tongue every 5 (five) minutes as needed. For chest pains   Yes [provider]  Polyethyl  Glycol-Propyl Glycol (SYSTANE) 0.4-0.3 % GEL ophthalmic gel Place 1 application into both eyes 4 (four) times daily.   Yes [provider]  polyethylene glycol (MIRALAX / GLYCOLAX) packet Take 17 g by mouth every other day.   Yes [provider]  senna (SENOKOT) 8.6 MG tablet Take 1 tablet by mouth 2 (two) times daily.   Yes [provider]    Family History History reviewed. No pertinent family history.  Social History Social History  Substance Use Topics  . Smoking status: Never Smoker  . Smokeless tobacco: Current User    Types: Snuff  . Alcohol use No     Allergies   Sulfa antibiotics   Review of Systems Review of Systems  Unable to perform ROS: Mental status change  Psychiatric/Behavioral: Positive for confusion.     Physical Exam Updated Vital Signs BP (!) 152/62 (BP Location: Right Arm)   Pulse (!) 105   Temp 98.4 F (36.9 C) (Axillary)   Resp 20   Ht 6' (1.829 m)   Wt 69.8 kg (153 lb 14.1 oz)   SpO2 99%   BMI 20.87 kg/m   Physical Exam  Constitutional: She appears well-developed and well-nourished.  HENT:  Head: Normocephalic and atraumatic.  Eyes: Conjunctivae and EOM are normal.  Neck: Normal range of motion.  Cardiovascular: Regular rhythm.  Tachycardia present.   Pulmonary/Chest: No stridor. No respiratory distress.  Abdominal: Soft. She exhibits no distension.  Neurological: She is alert.  Nursing note and vitals reviewed.    ED Treatments / Results  Labs (all labs ordered are listed, but only abnormal results are displayed) Labs Reviewed  COMPREHENSIVE METABOLIC PANEL - Abnormal; Notable for the following:       Result Value   Glucose, Bld 131 (*)    BUN 32 (*)    Creatinine, Ser 1.25 (*)    Albumin 3.1 (*)    Total Bilirubin 1.4 (*)    GFR calc non Af Amer 36 (*)    GFR calc Af Amer 41 (*)    All other components within normal limits  CBC WITH DIFFERENTIAL/PLATELET - Abnormal; Notable for the following:      RDW 15.7 (*)    Lymphs Abs 0.3 (*)    All other components within normal limits  BLOOD GAS, VENOUS - Abnormal; Notable for the following:    Acid-Base Excess 2.3 (*)    All other components within normal limits  URINALYSIS, ROUTINE W REFLEX MICROSCOPIC - Abnormal; Notable for the following:    Color, Urine AMBER (*)    APPearance HAZY (*)    Hgb urine dipstick LARGE (*)    Ketones, ur 5 (*)    Protein, ur 30 (*)    Leukocytes, UA LARGE (*)    Bacteria, UA MANY (*)    Squamous Epithelial / LPF 0-5 (*)    All other components within normal limits  CBC - Abnormal; Notable for the following:    Hemoglobin 11.3 (*)    HCT 34.9 (*)  RDW 15.8 (*)    Platelets 73 (*)    All other components within normal limits  COMPREHENSIVE METABOLIC PANEL - Abnormal; Notable for the following:    Glucose, Bld 126 (*)    BUN 32 (*)    Calcium 8.2 (*)    Total Protein 5.9 (*)    Albumin 2.7 (*)    GFR calc non Af Amer 50 (*)    GFR calc Af Amer 58 (*)    All other components within normal limits  BRAIN NATRIURETIC PEPTIDE - Abnormal; Notable for the following:    B Natriuretic Peptide 1,040.0 (*)    All other components within normal limits  CBG MONITORING, ED - Abnormal; Notable for the following:    Glucose-Capillary 113 (*)    All other components within normal limits  MRSA PCR SCREENING  URINE CULTURE  BASIC METABOLIC PANEL  CBC    EKG  EKG Interpretation  Date/Time:  Saturday June 08 2017 21:24:49 EDT Ventricular Rate:  88 PR Interval:    QRS Duration: 115 QT Interval:  398 QTC Calculation: 482 R Axis:   -44 Text Interpretation:  Sinus rhythm Incomplete left bundle branch block Left ventricular hypertrophy Anterior Q waves, possibly due to LVH No significant change since last tracing Confirmed by Merrily Pew [phone]) on 06/08/2017 9:57:06 PM       Radiology Dg Chest 1 View  Result Date: 06/08/2017 CLINICAL DATA:  Tachypnea and hypoxia with altered level level of  consciousness. EXAM: CHEST 1 VIEW COMPARISON:  04/19/2013 FINDINGS: AP upright view of the chest demonstrates cardiomegaly with aortic arteriosclerosis. Blunting of the right costophrenic angle appears chronic and may reflect pleural thickening versus a small right effusion. There is central pulmonary vascular congestion and redistribution consistent with mild CHF. No acute nor suspicious osseous abnormality. IMPRESSION: 1. Cardiomegaly with aortic atherosclerosis. 2. Mild central vascular congestion consistent with mild CHF. 3. Blunting of the right costophrenic angle may reflect pleural thickening or small right effusion. Electronically Signed   By: Ashley Royalty M.D.   On: 06/08/2017 23:05   Ct Head Wo Contrast  Result Date: 06/08/2017 CLINICAL DATA:  Initial evaluation for acute altered mental status. Recent fall. EXAM: CT HEAD WITHOUT CONTRAST TECHNIQUE: Contiguous axial images were obtained from the base of the skull through the vertex without intravenous contrast. COMPARISON:  Prior CT from 01/14/2013. FINDINGS: Brain: Generalized age related cerebral atrophy. Moderate chronic small vessel ischemic disease. Remote lacunar infarct present within the right thalamus. Small remote left cerebellar infarct. No acute intracranial hemorrhage. No evidence for acute large vessel territory infarct. No mass lesion, midline shift or mass effect. No hydrocephalus. No extra-axial fluid collection. Vascular: No hyperdense vessel. Scattered vascular calcifications noted within the carotid siphons. Skull: Scalp soft tissues and calvarium within normal limits. Sinuses/Orbits: Globes and orbital soft tissues normal. Patient status post lens extraction bilaterally. Visualized paranasal sinuses are clear. No mastoid effusion. Other: None. IMPRESSION: 1. No acute intracranial abnormality. 2. Remote right thalamic lacunar infarct with additional small remote left cerebellar infarct. 3. Generalized age related cerebral atrophy  with moderate chronic small vessel ischemic disease. Electronically Signed   By: Jeannine Boga M.D.   On: 06/08/2017 23:49    Procedures Procedures (including critical care time)  Medications Ordered in ED Medications  metoprolol succinate (TOPROL-XL) 24 hr tablet 25 mg (25 mg Oral Not Given 06/09/17 2200)  acetaminophen (TYLENOL) suppository 650 mg (not administered)  Vitamin D (Ergocalciferol) (DRISDOL) capsule 50,000 Units (50,000 Units Oral Not  Given 06/09/17 1000)  levothyroxine (SYNTHROID, LEVOTHROID) tablet 125 mcg (125 mcg Oral Not Given 06/09/17 0800)  polyethylene glycol (MIRALAX / GLYCOLAX) packet 17 g (17 g Oral Not Given 06/09/17 1000)  aspirin chewable tablet 81 mg (81 mg Oral Not Given 06/09/17 1000)  simvastatin (ZOCOR) tablet 40 mg (40 mg Oral Not Given 06/09/17 1000)  0.9 %  sodium chloride infusion ( Intravenous New Bag/Given 06/09/17 0350)  ondansetron (ZOFRAN) tablet 4 mg (not administered)    Or  ondansetron (ZOFRAN) injection 4 mg (not administered)  chlorhexidine (PERIDEX) 0.12 % solution 15 mL (15 mLs Mouth Rinse Not Given 06/09/17 2200)  MEDLINE mouth rinse (15 mLs Mouth Rinse Given 06/09/17 1451)  Influenza vac split quadrivalent PF (FLUZONE HIGH-DOSE) injection 0.5 mL (not administered)  polyvinyl alcohol (LIQUIFILM TEARS) 1.4 % ophthalmic solution 1 drop (1 drop Both Eyes Given 06/09/17 2237)  guaiFENesin (MUCINEX) 12 hr tablet 1,200 mg (1,200 mg Oral Not Given 06/09/17 2200)  cefTRIAXone (ROCEPHIN) 1 g in dextrose 5 % 50 mL IVPB (0 g Intravenous Stopped 06/09/17 2034)  hydrALAZINE (APRESOLINE) injection 5 mg (not administered)  ipratropium-albuterol (DUONEB) 0.5-2.5 (3) MG/3ML nebulizer solution 3 mL (not administered)  sodium chloride 0.9 % bolus 1,000 mL (0 mLs Intravenous Stopped 06/08/17 2306)  sodium chloride 0.9 % bolus 1,000 mL (0 mLs Intravenous Stopped 06/09/17 0100)  cefTRIAXone (ROCEPHIN) 1 g in dextrose 5 % 50 mL IVPB (0 g Intravenous  Stopped 06/09/17 0117)  furosemide (LASIX) injection 20 mg (20 mg Intravenous Given 06/09/17 0221)  morphine 2 MG/ML injection 2 mg (2 mg Intravenous Given 06/09/17 0223)     Initial Impression / Assessment and Plan / ED Course  I have reviewed the triage vital signs and the nursing notes.  Pertinent labs & imaging results that were available during my care of the patient were reviewed by me and considered in my medical decision making (see chart for details).     UTI as likely cause for encephalopahty. Rocephin given. UCx done. Fluids given. Will admit.   Final Clinical Impressions(s) / ED Diagnoses   Final diagnoses:  Tachypnea    New Prescriptions Current Discharge Medication List       Merrily Pew, MD 06/10/17 0040

## 2017-06-10 NOTE — Progress Notes (Addendum)
PROGRESS NOTE    Allison Collier  ZOX:[id number] DOB: Apr 18, 1921 DOA: 06/08/2017 PCP: Junie Spencer, FNP    Brief Narrative:  83 year old female with a history of systolic CHF, hypertension and hypothyroidism, was brought to the hospital with change in mental status.  There was concern that she may have developed a UTI and was started on Rocephin.  She was also noted to be short of breath and was hypoxic.  Chest x-ray indicated possible pulmonary vascular congestion.  She is admitted to the hospital.  It was noted that she was having difficulty swallowing and her shortness of breath may in fact be related to aspiration.  Speech therapy is evaluating.   Assessment & Plan:   Active Problems:   HTN (hypertension)   Hyperlipidemia   Hypothyroid   Lower urinary tract infectious disease   Chronic systolic CHF (congestive heart failure) (HCC)   Acute respiratory failure (HCC)   Thrombocytopenia (HCC)   1. Acute respiratory failure with hypoxia.  Initially thought to be due to decompensated CHF, but this may be secondary to aspiration.  Weaning down oxygen as tolerated.  Overall shortness of breath appears to have improved. 2. Chronic systolic congestive heart failure.  She received 1 dose of Lasix in the emergency room.  Upon evaluation, she does not appear to be particularly volume overloaded.  Chest x-ray does not appear to be markedly different compared to prior films.  She does not have any pedal edema.  We will hold off on further diuresis.  Ejection fraction is 30% on echocardiogram. 3. Dysphagia with possible aspiration.  Patient was noted to be choking while drinking water.  Speech therapy is evaluating. 4. Encephalopathy related to hypoxia.  Patient was less responsive on admission likely due to hypoxia.  This has since improved after having oxygen applied.  She appears to be approaching baseline. 5. Hypertension.  Blood pressures currently stable 6. Hyperlipidemia.  Continue on  statin 7. Hypothyroidism.  Continue on Synthroid 8. Thrombocytopenia.  Baseline labs are not available, since last lab values in our system from 2014.  At that time, she had mild thrombocytopenia with a platelet count of 125.  She does not have any evidence of bleeding at this time we will continue to monitor 9. Possible urinary tract infection.  Started on Rocephin.  Follow-up urine culture. 10. Chronic kidney disease stage II-III.  Creatinine appears to be near baseline.  Continue to monitor.   DVT prophylaxis: SCDs Code Status: DNR Family Communication: Discussed with family at the bedside Disposition Plan: Return to nursing facility on discharge   Consultants:     Procedures:  Echo:- Left ventricle: The cavity size was normal. Wall thickness was   normal. Systolic function was severely reduced. The estimated   ejection fraction was 30%. Diffuse hypokinesis. Doppler   parameters are consistent with abnormal left ventricular   relaxation (grade 1 diastolic dysfunction). Doppler parameters   are consistent with high ventricular filling pressure. - Regional wall motion abnormality: Akinesis of the basal-mid   inferoseptal myocardium; severe hypokinesis of the apical septal   myocardium; hypokinesis of the mid anterior and mid anteroseptal   myocardium; moderate hypokinesis of the apical myocardium; mild   hypokinesis of the apical lateral myocardium. - Aortic valve: Mildly to moderately calcified annulus. Trileaflet;   mildly thickened leaflets. There was severe regurgitation. - Aorta: Mild ascending aortic dilatation. Diameter 3.9 cm. - Mitral valve: There was moderate regurgitation.  - Tricuspid valve: There was mild regurgitation.  Antimicrobials:  Rocephin 10/21 >   Subjective: Speech is difficult to comprehend.  Family says that it is hard to understand her speech when her mouth is so dry.  She continues to have some cough.  Objective: Vitals:   06/10/17 0845  06/10/17 1500 06/10/17 1600 06/10/17 1632  BP:  134/65    Pulse:  87    Resp:  18    Temp:  98.3 F (36.8 C)    TempSrc:  Axillary    SpO2: 98% 99% 98% 98%  Weight:      Height:        Intake/Output Summary (Last 24 hours) at 06/10/17 1845 Last data filed at 06/10/17 1745  Gross per 24 hour  Intake           988.67 ml  Output                0 ml  Net           988.67 ml   Filed Weights   06/09/17 0328  Weight: 69.8 kg (153 lb 14.1 oz)    Examination:  General exam: Appears calm and comfortable  Respiratory system: Coarse breath sounds bilaterally. Respiratory effort normal. Cardiovascular system: S1 & S2 heard, RRR. No JVD, murmurs, rubs, gallops or clicks. No pedal edema. Gastrointestinal system: Abdomen is nondistended, soft and nontender. No organomegaly or masses felt. Normal bowel sounds heard. Central nervous system:  No focal neurological deficits. Extremities: Symmetric 5 x 5 power. Skin: No rashes, lesions or ulcers Psychiatry: Unable to assess since patient is difficult to comprehend    Data Reviewed: I have personally reviewed following labs and imaging studies  CBC:  Recent Labs Lab 06/08/17 2134 06/09/17 0704 06/10/17 0956  WBC 7.0 4.6 3.9*  NEUTROABS 6.5  --   --   HGB 12.0 11.3* 11.2*  HCT 36.2 34.9* 35.2*  MCV 88.9 90.2 91.4  PLT PLATELETS APPEAR DECREASED 73* 77*   Basic Metabolic Panel:  Recent Labs Lab 06/08/17 2133 06/09/17 0704 06/10/17 0956  NA 144 142 148*  K 4.6 3.5 3.9  CL 107 110 114*  CO2 27 23 27   GLUCOSE 131* 126* 119*  BUN 32* 32* 36*  CREATININE 1.25* 0.95 0.91  CALCIUM 9.1 8.2* 8.7*   GFR: Estimated Creatinine Clearance: 41.7 mL/min (by C-G formula based on SCr of 0.91 mg/dL). Liver Function Tests:  Recent Labs Lab 06/08/17 2133 06/09/17 0704  AST 31 30  ALT 16 18  ALKPHOS 86 79  BILITOT 1.4* 0.9  PROT 6.6 5.9*  ALBUMIN 3.1* 2.7*   No results for input(s): LIPASE, AMYLASE in the last 168 hours. No  results for input(s): AMMONIA in the last 168 hours. Coagulation Profile: No results for input(s): INR, PROTIME in the last 168 hours. Cardiac Enzymes: No results for input(s): CKTOTAL, CKMB, CKMBINDEX, TROPONINI in the last 168 hours. BNP (last 3 results) No results for input(s): PROBNP in the last 8760 hours. HbA1C: No results for input(s): HGBA1C in the last 72 hours. CBG:  Recent Labs Lab 06/08/17 2131  GLUCAP 113*   Lipid Profile: No results for input(s): CHOL, HDL, LDLCALC, TRIG, CHOLHDL, LDLDIRECT in the last 72 hours. Thyroid Function Tests: No results for input(s): TSH, T4TOTAL, FREET4, T3FREE, THYROIDAB in the last 72 hours. Anemia Panel: No results for input(s): VITAMINB12, FOLATE, FERRITIN, TIBC, IRON, RETICCTPCT in the last 72 hours. Sepsis Labs: No results for input(s): PROCALCITON, LATICACIDVEN in the last 168 hours.  Recent Results (from the past 240 hour(s))  Culture, Urine     Status: None (Preliminary result)   Collection Time: 06/08/17 11:03 PM  Result Value Ref Range Status   Specimen Description URINE, CLEAN CATCH  Final   Special Requests NONE  Final   Culture   Final    CULTURE REINCUBATED FOR BETTER GROWTH Performed at [facility], [address] [place], [place] [address]    Report Status PENDING  Incomplete  MRSA PCR Screening     Status: None   Collection Time: 06/09/17  3:56 AM  Result Value Ref Range Status   MRSA by PCR NEGATIVE NEGATIVE Final    Comment:        The GeneXpert MRSA Assay (FDA approved for NASAL specimens only), is one component of a comprehensive MRSA colonization surveillance program. It is not intended to diagnose MRSA infection nor to guide or monitor treatment for MRSA infections.          Radiology Studies: Dg Chest 1 View  Result Date: 06/08/2017 CLINICAL DATA:  Tachypnea and hypoxia with altered level level of consciousness. EXAM: CHEST 1 VIEW COMPARISON:  04/19/2013 FINDINGS: AP upright view of  the chest demonstrates cardiomegaly with aortic arteriosclerosis. Blunting of the right costophrenic angle appears chronic and may reflect pleural thickening versus a small right effusion. There is central pulmonary vascular congestion and redistribution consistent with mild CHF. No acute nor suspicious osseous abnormality. IMPRESSION: 1. Cardiomegaly with aortic atherosclerosis. 2. Mild central vascular congestion consistent with mild CHF. 3. Blunting of the right costophrenic angle may reflect pleural thickening or small right effusion. Electronically Signed   By: Tollie Eth M.D.   On: 06/08/2017 23:05   Ct Head Wo Contrast  Result Date: 06/08/2017 CLINICAL DATA:  Initial evaluation for acute altered mental status. Recent fall. EXAM: CT HEAD WITHOUT CONTRAST TECHNIQUE: Contiguous axial images were obtained from the base of the skull through the vertex without intravenous contrast. COMPARISON:  Prior CT from 01/14/2013. FINDINGS: Brain: Generalized age related cerebral atrophy. Moderate chronic small vessel ischemic disease. Remote lacunar infarct present within the right thalamus. Small remote left cerebellar infarct. No acute intracranial hemorrhage. No evidence for acute large vessel territory infarct. No mass lesion, midline shift or mass effect. No hydrocephalus. No extra-axial fluid collection. Vascular: No hyperdense vessel. Scattered vascular calcifications noted within the carotid siphons. Skull: Scalp soft tissues and calvarium within normal limits. Sinuses/Orbits: Globes and orbital soft tissues normal. Patient status post lens extraction bilaterally. Visualized paranasal sinuses are clear. No mastoid effusion. Other: None. IMPRESSION: 1. No acute intracranial abnormality. 2. Remote right thalamic lacunar infarct with additional small remote left cerebellar infarct. 3. Generalized age related cerebral atrophy with moderate chronic small vessel ischemic disease. Electronically Signed   By: Rise Mu M.D.   On: 06/08/2017 23:49        Scheduled Meds: . aspirin  81 mg Oral Daily  . chlorhexidine  15 mL Mouth Rinse BID  . guaiFENesin  1,200 mg Oral BID  . ipratropium-albuterol  3 mL Nebulization BID  . levothyroxine  125 mcg Oral QAC breakfast  . mouth rinse  15 mL Mouth Rinse q12n4p  . metoprolol succinate  25 mg Oral QHS  . polyethylene glycol  17 g Oral QODAY  . polyvinyl alcohol  1 drop Both Eyes QID  . simvastatin  40 mg Oral Daily  . Vitamin D (Ergocalciferol)  50,000 Units Oral Q30 days   Continuous Infusions: . sodium chloride 10 mL/hr at 06/09/17 0350  .  cefTRIAXone (ROCEPHIN)  IV Stopped (06/09/17 2034)     LOS: 1 day    Time spent:    Canio Winokur, MD [facility] Pager [phone]  If 7PM-7AM, please contact night-coverage [email or web address] Password [facility] 06/10/2017, 6:45 PM

## 2017-06-10 NOTE — [place] FL2 (Signed)
[place] MEDICAID FL2 LEVEL OF CARE SCREENING TOOL     IDENTIFICATION  Patient Name: Allison Collier Birthdate: March 31, 1921 Sex: female Admission Date (Current Location): 06/08/2017  [facility] and [place] Number:  [name] and Address:  [name],  [address], [place] [address]      Provider Number: [id number]  Attending Physician Name and Address:  Erick Blinks, MD  Relative Name and Phone Number:       Current Level of Care: Hospital Recommended Level of Care: Skilled Nursing Facility Prior Approval Number:    Date Approved/Denied:   PASRR Number:    Discharge Plan: SNF    Current Diagnoses: Patient Active Problem List   Diagnosis Date Noted  . Acute respiratory failure (HCC) 06/09/2017  . Thrombocytopenia (HCC) 06/09/2017  . Hypernatremia 04/19/2013  . Dysphagia 04/19/2013  . CAP (community acquired pneumonia) 01/16/2013  . Hydropneumothorax 01/16/2013  . Chronic systolic CHF (congestive heart failure) (HCC) 01/16/2013  . Fx metacarpal 01/15/2013  . Pleural effusion on right 01/14/2013  . Metacarpal bone fracture 01/14/2013  . Fall 01/14/2013  . Shingles outbreak 11/21/2012  . Lower urinary tract infectious disease 09/03/2011  . Closed left hip fracture (HCC) 08/30/2011  . Anemia 08/30/2011  . HTN (hypertension) 11/04/2010  . Hyperlipidemia 11/04/2010  . Osteoarthritis 11/04/2010  . GERD (gastroesophageal reflux disease) 11/04/2010  . Osteopenia 11/04/2010  . CAD (coronary artery disease) 11/04/2010  . Hypothyroid 11/04/2010  . Chronic back pain 11/04/2010  . Esophageal cancer (HCC) 11/04/2010    Orientation RESPIRATION BLADDER Height & Weight     Self  O2 (3L) Incontinent Weight: 153 lb 14.1 oz (69.8 kg) Height:  6' (182.9 cm)  BEHAVIORAL SYMPTOMS/MOOD NEUROLOGICAL BOWEL NUTRITION STATUS      Incontinent Diet (see discharge summary)  AMBULATORY STATUS COMMUNICATION OF NEEDS Skin   Extensive Assist (wheelchair )  Verbally Normal                       Personal Care Assistance Level of Assistance  Bathing, Feeding, Dressing Bathing Assistance: Limited assistance Feeding assistance: Independent Dressing Assistance: Limited assistance     Functional Limitations Info  Sight, Hearing, Speech Sight Info: Adequate Hearing Info: Adequate Speech Info: Adequate    SPECIAL CARE FACTORS FREQUENCY                       Contractures Contractures Info: Not present    Additional Factors Info  Code Status, Allergies Code Status Info: DNR Allergies Info: Sulfa Antibiotics.            Current Medications (06/10/2017):  This is the current hospital active medication list Current Facility-Administered Medications  Medication Dose Route Frequency Provider Last Rate Last Dose  . 0.9 %  sodium chloride infusion   Intravenous Continuous Meredeth Ide, MD 10 mL/hr at 06/09/17 0350    . acetaminophen (TYLENOL) suppository 650 mg  650 mg Rectal Q4H PRN Meredeth Ide, MD      . aspirin chewable tablet 81 mg  81 mg Oral Daily [place], Sarina Ill, MD      . cefTRIAXone (ROCEPHIN) 1 g in dextrose 5 % 50 mL IVPB  1 g Intravenous Q24H Erick Blinks, MD   Stopped at 06/09/17 2034  . chlorhexidine (PERIDEX) 0.12 % solution 15 mL  15 mL Mouth Rinse BID Meredeth Ide, MD      . guaiFENesin (MUCINEX) 12 hr tablet 1,200 mg  1,200  mg Oral BID Erick Blinks, MD   1,200 mg at 06/09/17 1358  . hydrALAZINE (APRESOLINE) injection 5 mg  5 mg Intravenous Q6H PRN Schorr, Roma Kayser, NP      . Influenza vac split quadrivalent PF (FLUZONE HIGH-DOSE) injection 0.5 mL  0.5 mL Intramuscular Tomorrow-1000 [place], Gagan S, MD      . ipratropium-albuterol (DUONEB) 0.5-2.5 (3) MG/3ML nebulizer solution 3 mL  3 mL Nebulization BID Erick Blinks, MD      . levothyroxine (SYNTHROID, LEVOTHROID) tablet 125 mcg  125 mcg Oral QAC breakfast Meredeth Ide, MD      . MEDLINE mouth rinse  15 mL Mouth Rinse q12n4p Sharl Ma, Sarina Ill, MD   15 mL  at 06/09/17 1451  . metoprolol succinate (TOPROL-XL) 24 hr tablet 25 mg  25 mg Oral QHS Sharl Ma, Sarina Ill, MD      . ondansetron (ZOFRAN) tablet 4 mg  4 mg Oral Q6H PRN Meredeth Ide, MD       Or  . ondansetron (ZOFRAN) injection 4 mg  4 mg Intravenous Q6H PRN Sharl Ma, Sarina Ill, MD      . polyethylene glycol (MIRALAX / GLYCOLAX) packet 17 g  17 g Oral QODAY Sharl Ma, Sarina Ill, MD      . polyvinyl alcohol (LIQUIFILM TEARS) 1.4 % ophthalmic solution 1 drop  1 drop Both Eyes QID Erick Blinks, MD   1 drop at 06/09/17 2237  . simvastatin (ZOCOR) tablet 40 mg  40 mg Oral Daily Sharl Ma, Sarina Ill, MD      . Vitamin D (Ergocalciferol) (DRISDOL) capsule 50,000 Units  50,000 Units Oral Q30 days Meredeth Ide, MD         Discharge Medications: Please see discharge summary for a list of discharge medications.  Relevant Imaging Results:  Relevant Lab Results:   Additional Information    Sharryn Belding, Juleen China, LCSW

## 2017-06-10 NOTE — Progress Notes (Signed)
*  PRELIMINARY RESULTS* Echocardiogram 2D Echocardiogram with definity has been performed.  Leavy Cella 06/10/2017, 9:49 AM

## 2017-06-10 NOTE — Clinical Social Work Note (Signed)
  Clinical Social Work Assessment  Patient Details  Name: Allison Collier MRN: [id number] Date of Birth: Dec 07, 1920  Date of referral:  06/10/17               Reason for consult:  Discharge Planning                Permission sought to share information with:    Permission granted to share information::     Name::        Agency::  Lynnae Sandhoff at [facility].   Relationship::     Contact Information:     Housing/Transportation Living arrangements for the past 2 months:  [place] of Information:  Adult Children, Facility Patient Interpreter Needed:  None Criminal Activity/Legal Involvement Pertinent to Current Situation/Hospitalization:    Significant Relationships:  Adult Children Lives with:  Facility Resident Do you feel safe going back to the place where you live?  Yes Need for family participation in patient care:  Yes (Comment)  Care giving concerns: Per Lynnae Sandhoff at [facility], patient is confused. She requires assistance with bathing and dressing. Patient feeds herself. Patient is documented that she transfers to her wheelchair without assistance but on 06/06/17, she recently sat in the floor while trying to transfer independently. Larene Beach stated that patient will likely need to be a +1 or supervision for transfers.  Patient has a supportive family.  She can return at discharge. Patient's daughter, Leona Singleton, confirmed Shannon's statements and states that the family wants patient to return to [facility] at discharge.   Social Worker assessment / plan:  LCSW will follow patient through discharge and completed discharge documentation when patient is medically appropriate for discharge.   Employment status:  Retired Nurse, adult PT Recommendations:  Not assessed at this time Information / Referral to community resources:     Patient/Family's Response to care:  Family is agreeable for patient to return to the  facility at discharge.   Patient/Family's Understanding of and Emotional Response to Diagnosis, Current Treatment, and Prognosis: Family understands patient's diagnosis, treatment and prognosis.   Emotional Assessment Appearance:  Appears stated age Attitude/Demeanor/Rapport:    Affect (typically observed):  Unable to Assess Orientation:  Oriented to Self Alcohol / Substance use:  Not Applicable Psych involvement (Current and /or in the community):  No (Comment)  Discharge Needs  Concerns to be addressed:    Readmission within the last 30 days:  No Current discharge risk:  None Barriers to Discharge:  No Barriers Identified   Ihor Gully, LCSW 06/10/2017, 2:00 PM

## 2017-06-11 ENCOUNTER — Inpatient Hospital Stay (HOSPITAL_COMMUNITY): Payer: Medicare Other

## 2017-06-11 DIAGNOSIS — E870 Hyperosmolality and hypernatremia: Secondary | ICD-10-CM

## 2017-06-11 LAB — CBC
HCT: 37.8 % (ref 36.0–46.0)
Hemoglobin: 11.9 g/dL — ABNORMAL LOW (ref 12.0–15.0)
MCH: 28.8 pg (ref 26.0–34.0)
MCHC: 31.5 g/dL (ref 30.0–36.0)
MCV: 91.5 fL (ref 78.0–100.0)
PLATELETS: 82 10*3/uL — AB (ref 150–400)
RBC: 4.13 MIL/uL (ref 3.87–5.11)
RDW: 16 % — ABNORMAL HIGH (ref 11.5–15.5)
WBC: 4 10*3/uL (ref 4.0–10.5)

## 2017-06-11 LAB — BASIC METABOLIC PANEL
Anion gap: 10 (ref 5–15)
BUN: 34 mg/dL — AB (ref 6–20)
CHLORIDE: 117 mmol/L — AB (ref 101–111)
CO2: 25 mmol/L (ref 22–32)
CREATININE: 0.85 mg/dL (ref 0.44–1.00)
Calcium: 9.1 mg/dL (ref 8.9–10.3)
GFR, EST NON AFRICAN AMERICAN: 57 mL/min — AB (ref >60)
Glucose, Bld: 111 mg/dL — ABNORMAL HIGH (ref 65–99)
Potassium: 4.1 mmol/L (ref 3.5–5.1)
SODIUM: 152 mmol/L — AB (ref 135–145)

## 2017-06-11 MED ORDER — IPRATROPIUM-ALBUTEROL 0.5-2.5 (3) MG/3ML IN SOLN: 3.0000 mL | Freq: Four times a day (QID) | RESPIRATORY_TRACT | Status: DC | PRN

## 2017-06-11 MED ORDER — DEXTROSE 5 % IV SOLN
INTRAVENOUS | Status: DC
  Filled 2017-06-11 (×3): qty 1000

## 2017-06-11 MED ORDER — LORAZEPAM 2 MG/ML IJ SOLN
0.5000 mg | Freq: Once | INTRAMUSCULAR | Status: AC
  Administered 2017-06-11: 0.5 mg via INTRAVENOUS
  Filled 2017-06-11: qty 1

## 2017-06-11 MED ORDER — DEXTROSE 5 % IV SOLN
INTRAVENOUS | Status: DC
  Administered 2017-06-11 – 2017-06-15 (×6): via INTRAVENOUS

## 2017-06-11 MED ORDER — RESOURCE THICKENUP CLEAR PO POWD
ORAL | Status: DC | PRN
  Filled 2017-06-11: qty 125

## 2017-06-11 NOTE — Progress Notes (Signed)
SLP Cancellation Note  Patient Details Name: Allison Collier MRN: [id number] DOB: [birth year]   Cancelled treatment:       Reason Eval/Treat Not Completed: Other (comment); MBSS planned, but not completed this day due to alertness. RN reports poor tolerance with NTL with coughing during ingestion. Recommend hold PO if pt not alert/upright; when alert offer Magic Cup. Will plan for MBSS in the AM (Wednesday).  Thank you,  Genene Churn, [place]    [place] 06/11/2017, 6:54 PM

## 2017-06-11 NOTE — Progress Notes (Signed)
PROGRESS NOTE    Allison Collier  ZOX:[id number] DOB: 1920/10/01 DOA: 06/08/2017 PCP: Junie Spencer, FNP    Brief Narrative:  83 year old female with a history of systolic CHF, hypertension and hypothyroidism, was brought to the hospital with change in mental status.  There was concern that she may have developed a UTI and was started on Rocephin.  She was also noted to be short of breath and was hypoxic.  Chest x-ray indicated possible pulmonary vascular congestion.  She is admitted to the hospital.  It was noted that she was having difficulty swallowing and her shortness of breath may in fact be related to aspiration.  Speech therapy is evaluating.   Assessment & Plan:   Active Problems:   HTN (hypertension)   Hyperlipidemia   Hypothyroid   Lower urinary tract infectious disease   Chronic systolic CHF (congestive heart failure) (HCC)   Acute respiratory failure with hypoxia (HCC)   Thrombocytopenia (HCC)   Encephalopathy   1. Acute respiratory failure with hypoxia.  Initially thought to be due to decompensated CHF, but this may be secondary to aspiration.  Weaning down oxygen as tolerated.  Overall shortness of breath appears to have improved. 2. Chronic systolic congestive heart failure.  She received 1 dose of Lasix in the emergency room.  Upon evaluation, she does not appear to be particularly volume overloaded.  Chest x-ray does not appear to be markedly different compared to prior films.  She does not have any pedal edema.  We will hold off on further diuresis.  Ejection fraction is 30% on echocardiogram. 3. Dysphagia with possible aspiration.  Patient was noted to be choking while drinking water.  Speech therapy is evaluating. 4. Encephalopathy related to hypoxia.  Patient was less responsive on admission likely due to hypoxia.  This has since improved after having oxygen applied.  She appears to be approaching baseline. 5. Hypertension.  Blood pressures currently  stable 6. Hyperlipidemia.  Continue on statin 7. Hypothyroidism.  Continue on Synthroid 8. Thrombocytopenia.  Baseline labs are not available, since last lab values in our system from 2014.  At that time, she had mild thrombocytopenia with a platelet count of 125.  She does not have any evidence of bleeding at this time we will continue to monitor 9. Urinary tract infection.  Started on Rocephin.  Urine culture shows gram-negative rods.  Follow-up further identification. 10. Chronic kidney disease stage II-III.  Creatinine appears to be near baseline.  Continue to monitor. 11. Hyponatremia.  Suspect related to decreased p.o. intake and volume depletion.  Started on hypotonic fluids.  Recheck labs in a.m.   DVT prophylaxis: SCDs Code Status: DNR Family Communication: Discussed with family at the bedside Disposition Plan: Return to nursing facility on discharge   Consultants:     Procedures:  Echo:- Left ventricle: The cavity size was normal. Wall thickness was   normal. Systolic function was severely reduced. The estimated   ejection fraction was 30%. Diffuse hypokinesis. Doppler   parameters are consistent with abnormal left ventricular   relaxation (grade 1 diastolic dysfunction). Doppler parameters   are consistent with high ventricular filling pressure. - Regional wall motion abnormality: Akinesis of the basal-mid   inferoseptal myocardium; severe hypokinesis of the apical septal   myocardium; hypokinesis of the mid anterior and mid anteroseptal   myocardium; moderate hypokinesis of the apical myocardium; mild   hypokinesis of the apical lateral myocardium. - Aortic valve: Mildly to moderately calcified annulus. Trileaflet;   mildly thickened  leaflets. There was severe regurgitation. - Aorta: Mild ascending aortic dilatation. Diameter 3.9 cm. - Mitral valve: There was moderate regurgitation.  - Tricuspid valve: There was mild regurgitation.  Antimicrobials:  Rocephin 10/21  >   Subjective: Continues to have difficulty swallowing.  Overall shortness of breath has improved.  She has been weaned down to room air.  P.o. intake has been poor.  Objective: Vitals:   06/10/17 2040 06/10/17 2232 06/11/17 0701 06/11/17 1400  BP:  (!) 149/62 (!) 170/86 (!) 175/77  Pulse:  98 96 93  Resp:  18 18 18   Temp:  97.8 F (36.6 C) (!) 97.5 F (36.4 C) 98.6 F (37 C)  TempSrc:  Axillary Axillary   SpO2: 94% 93% 97% 96%  Weight:      Height:        Intake/Output Summary (Last 24 hours) at 06/11/17 1729 Last data filed at 06/11/17 1500  Gross per 24 hour  Intake           471.25 ml  Output                0 ml  Net           471.25 ml   Filed Weights   06/09/17 0328  Weight: 69.8 kg (153 lb 14.1 oz)    Examination:  General exam: Appears calm and comfortable, mucous membranes are dry  Respiratory system: Coarse breath sounds at bases. Respiratory effort normal. Cardiovascular system: S1 & S2 heard, RRR. No JVD, murmurs, rubs, gallops or clicks. No pedal edema. Gastrointestinal system: Abdomen is nondistended, soft and nontender. No organomegaly or masses felt. Normal bowel sounds heard. Central nervous system:  No focal neurological deficits. Extremities: Symmetric 5 x 5 power. Skin: No rashes, lesions or ulcers Psychiatry: Unable to assess since patient is difficult to comprehend    Data Reviewed: I have personally reviewed following labs and imaging studies  CBC:  Recent Labs Lab 06/08/17 2134 06/09/17 0704 06/10/17 0956 06/11/17 0943  WBC 7.0 4.6 3.9* 4.0  NEUTROABS 6.5  --   --   --   HGB 12.0 11.3* 11.2* 11.9*  HCT 36.2 34.9* 35.2* 37.8  MCV 88.9 90.2 91.4 91.5  PLT PLATELETS APPEAR DECREASED 73* 77* 82*   Basic Metabolic Panel:  Recent Labs Lab 06/08/17 2133 06/09/17 0704 06/10/17 0956 06/11/17 0943  NA 144 142 148* 152*  K 4.6 3.5 3.9 4.1  CL 107 110 114* 117*  CO2 27 23 27 25   GLUCOSE 131* 126* 119* 111*  BUN 32* 32* 36* 34*   CREATININE 1.25* 0.95 0.91 0.85  CALCIUM 9.1 8.2* 8.7* 9.1   GFR: Estimated Creatinine Clearance: 44.6 mL/min (by C-G formula based on SCr of 0.85 mg/dL). Liver Function Tests:  Recent Labs Lab 06/08/17 2133 06/09/17 0704  AST 31 30  ALT 16 18  ALKPHOS 86 79  BILITOT 1.4* 0.9  PROT 6.6 5.9*  ALBUMIN 3.1* 2.7*   No results for input(s): LIPASE, AMYLASE in the last 168 hours. No results for input(s): AMMONIA in the last 168 hours. Coagulation Profile: No results for input(s): INR, PROTIME in the last 168 hours. Cardiac Enzymes: No results for input(s): CKTOTAL, CKMB, CKMBINDEX, TROPONINI in the last 168 hours. BNP (last 3 results) No results for input(s): PROBNP in the last 8760 hours. HbA1C: No results for input(s): HGBA1C in the last 72 hours. CBG:  Recent Labs Lab 06/08/17 2131  GLUCAP 113*   Lipid Profile: No results for input(s): CHOL,  HDL, LDLCALC, TRIG, CHOLHDL, LDLDIRECT in the last 72 hours. Thyroid Function Tests: No results for input(s): TSH, T4TOTAL, FREET4, T3FREE, THYROIDAB in the last 72 hours. Anemia Panel: No results for input(s): VITAMINB12, FOLATE, FERRITIN, TIBC, IRON, RETICCTPCT in the last 72 hours. Sepsis Labs: No results for input(s): PROCALCITON, LATICACIDVEN in the last 168 hours.  Recent Results (from the past 240 hour(s))  Culture, Urine     Status: Abnormal (Preliminary result)   Collection Time: 06/08/17 11:03 PM  Result Value Ref Range Status   Specimen Description URINE, CLEAN CATCH  Final   Special Requests NONE  Final   Culture (A)  Final    >=100,000 COLONIES/mL KLEBSIELLA OXYTOCA SUSCEPTIBILITIES TO FOLLOW Performed at [facility], [address] [place], [place] [address]    Report Status PENDING  Incomplete  MRSA PCR Screening     Status: None   Collection Time: 06/09/17  3:56 AM  Result Value Ref Range Status   MRSA by PCR NEGATIVE NEGATIVE Final    Comment:        The GeneXpert MRSA Assay (FDA approved for  NASAL specimens only), is one component of a comprehensive MRSA colonization surveillance program. It is not intended to diagnose MRSA infection nor to guide or monitor treatment for MRSA infections.          Radiology Studies: No results found.      Scheduled Meds: . aspirin  81 mg Oral Daily  . chlorhexidine  15 mL Mouth Rinse BID  . guaiFENesin  1,200 mg Oral BID  . levothyroxine  125 mcg Oral QAC breakfast  . mouth rinse  15 mL Mouth Rinse q12n4p  . metoprolol succinate  25 mg Oral QHS  . polyethylene glycol  17 g Oral QODAY  . polyvinyl alcohol  1 drop Both Eyes QID  . simvastatin  40 mg Oral Daily  . Vitamin D (Ergocalciferol)  50,000 Units Oral Q30 days   Continuous Infusions: . sodium chloride 10 mL/hr at 06/09/17 0350  . cefTRIAXone (ROCEPHIN)  IV Stopped (06/10/17 2129)  . dextrose 75 mL/hr at 06/11/17 1155     LOS: 2 days    Time spent:    Feather Berrie, MD [facility] Pager [phone]  If 7PM-7AM, please contact night-coverage [email or web address] Password [email or web address] 06/11/2017, 5:29 PM

## 2017-06-12 ENCOUNTER — Inpatient Hospital Stay (HOSPITAL_COMMUNITY): Payer: Medicare Other

## 2017-06-12 DIAGNOSIS — G9340 Encephalopathy, unspecified: Secondary | ICD-10-CM

## 2017-06-12 DIAGNOSIS — E039 Hypothyroidism, unspecified: Secondary | ICD-10-CM

## 2017-06-12 DIAGNOSIS — I10 Essential (primary) hypertension: Secondary | ICD-10-CM

## 2017-06-12 DIAGNOSIS — N390 Urinary tract infection, site not specified: Secondary | ICD-10-CM

## 2017-06-12 DIAGNOSIS — D696 Thrombocytopenia, unspecified: Secondary | ICD-10-CM

## 2017-06-12 DIAGNOSIS — I5022 Chronic systolic (congestive) heart failure: Secondary | ICD-10-CM

## 2017-06-12 DIAGNOSIS — J9601 Acute respiratory failure with hypoxia: Principal | ICD-10-CM

## 2017-06-12 LAB — BASIC METABOLIC PANEL
ANION GAP: 8 (ref 5–15)
BUN: 31 mg/dL — ABNORMAL HIGH (ref 6–20)
CO2: 26 mmol/L (ref 22–32)
Calcium: 8.9 mg/dL (ref 8.9–10.3)
Chloride: 113 mmol/L — ABNORMAL HIGH (ref 101–111)
Creatinine, Ser: 0.73 mg/dL (ref 0.44–1.00)
GFR calc Af Amer: >60 mL/min (ref >60)
Glucose, Bld: 128 mg/dL — ABNORMAL HIGH (ref 65–99)
POTASSIUM: 4.2 mmol/L (ref 3.5–5.1)
SODIUM: 147 mmol/L — AB (ref 135–145)

## 2017-06-12 LAB — CBC
HEMATOCRIT: 38.4 % (ref 36.0–46.0)
HEMOGLOBIN: 12.1 g/dL (ref 12.0–15.0)
MCH: 29 pg (ref 26.0–34.0)
MCHC: 31.5 g/dL (ref 30.0–36.0)
MCV: 92.1 fL (ref 78.0–100.0)
PLATELETS: 88 10*3/uL — AB (ref 150–400)
RBC: 4.17 MIL/uL (ref 3.87–5.11)
RDW: 15.6 % — AB (ref 11.5–15.5)
WBC: 4.2 10*3/uL (ref 4.0–10.5)

## 2017-06-12 LAB — URINE CULTURE

## 2017-06-12 MED ORDER — LORAZEPAM 2 MG/ML IJ SOLN
0.5000 mg | Freq: Once | INTRAMUSCULAR | Status: AC
  Administered 2017-06-12: 0.5 mg via INTRAVENOUS
  Filled 2017-06-12: qty 1

## 2017-06-12 NOTE — Progress Notes (Signed)
Pharmacy Antibiotic Note  Allison Collier is a 83 y.o. female admitted on 06/08/2017 with UTI.  Pharmacy has been consulted for Ceftriaxone dosing.  Plan: Continue Ceftriaxone 1 GM IV every 24 hours Duration of therapy per MD F/U cultures, LOT Labs per protocol  Height: 6' (182.9 cm) Weight: 153 lb 14.1 oz (69.8 kg) IBW/kg (Calculated) : 73.1  Temp (24hrs), Avg:97.9 F (36.6 C), Min:97.5 F (36.4 C), Max:98.6 F (37 C)   Recent Labs Lab 06/08/17 2133 06/08/17 2134 06/09/17 0704 06/10/17 0956 06/11/17 0943 06/12/17 0418  WBC  --  7.0 4.6 3.9* 4.0 4.2  CREATININE 1.25*  --  0.95 0.91 0.85 0.73    Estimated Creatinine Clearance: 47.4 mL/min (by C-G formula based on SCr of 0.73 mg/dL).    Allergies  Allergen Reactions  . Sulfa Antibiotics     Reaction unknown   Antimicrobials this admission: Ceftriaxone  10/21 >>   Dose adjustments this admission:  Microbiology results:  UCx:  Klebsiella S to Rocephin  MRSA PCR: negative  Thank you for allowing pharmacy to be a part of this patient's care.  Hart Robinsons A 06/12/2017 10:55 AM

## 2017-06-12 NOTE — Progress Notes (Signed)
Modified Barium Swallow Progress Note  Patient Details  Name: Allison Collier MRN: [id number] Date of Birth: August 21, 1920  Today's Date: 06/12/2017  Modified Barium Swallow completed.  Full report located under Chart Review in the Imaging Section.  Brief recommendations include the following:  Clinical Impression  Functionally moderate/severe oral phase dysphagia and moderate pharyngeal phase dysphagia characterized by weak oral sling muscles (decreased labial closure, inefficient lingual movement) resulting in anterior labial spillage with liquids, decreased anterior posterior transit, lingual pumping, oral holding, and premature spillage. Pharyngeal phase is characterized by delay in swallow initiation, reduced tongue base retraction, epiglottic deflection, and reduced pharyngeal pressure resulting in mod/severe vallecular residue after the swallow. Pt with deep penetration of NTL before the swallow which was sensed with a cough. Although gross aspiration was not observed during imaging, Pt is at high risk for aspiration due to pharyngeal weakness and resulting residuals. Esophageal sweep revealed barium stasis throughout the esophageal column with some retrograde movement indicated a component of esophageal phase dysphagia in addition to oropharyngeal dysphagia. Pt reportedly has a history of esophageal CA treated with radiation and has had dilation in the past to disrupt scar tissue per family. Pt had  PEG in the past in order to get through CA treatment. Pt's poor cognitive status and decreased alertness negatively impact safe swallow at this time in addition to weakness. This assessment and imaging was reviewed with Pt's two daughters. SLP indicated that Pt is at high risk for aspiration, dehydration, and malnutrition. SLP suggested that family speak with MD and palliative care team to help establish goals of care. Family indicated that "she is 83 years old" and likely would not be interested in  feeding tubes and hope to offer oral diet if possible. SLP provided further education regarding negative implications of feeding tubes in patient's with dementia. SLP recommended "safest diet" with understanding of risk of aspiration of D1/puree with honey-thick liquids by teaspoon when Pt is alert and upright. Above reviewed with MD and RN. Will request palliative care consult. PT consult may be beneficial given that Pt appears deconditioned and was reportedly able to ambulate to bathroom with a walker PTA. Head CT showed Remote right thalamic lacunar infarct with additional small remote left cerebellar infarct. Pt presenting with seemingly right upper extremity weakness, although difficult to determine if this is "new" or not. SLP will follow per goals of care.   Swallow Evaluation Recommendations   Recommended Consults:  (PT; Palliative care)   SLP Diet Recommendations: Dysphagia 1 (Puree) solids;Honey thick liquids;Ice chips PRN after oral care   Liquid Administration via: Spoon;No straw   Medication Administration: Crushed with puree   Supervision: Full assist for feeding;Full supervision/cueing for compensatory strategies   Compensations: Slow rate;Small sips/bites   Postural Changes: Remain semi-upright after after feeds/meals (Comment);Seated upright at 90 degrees   Oral Care Recommendations: Oral care before and after PO;Staff/trained caregiver to provide oral care;Oral care QID   Other Recommendations: Order thickener from pharmacy;Clarify dietary restrictions   Thank you,  Genene Churn, [place]  Carmichael Burdette 06/12/2017,3:09 PM

## 2017-06-12 NOTE — Plan of Care (Signed)
Family meeting scheduled for 10/25 at 1300 with daughter Francile Woolford. No charge

## 2017-06-12 NOTE — Progress Notes (Signed)
PROGRESS NOTE    Allison Collier  YOV:[id number] DOB: 12-04-1920 DOA: 06/08/2017 PCP: Sharion Balloon, FNP   Brief Narrative:  83 year old female with a history of systolic CHF, hypertension and hypothyroidism, was brought to the hospital with change in mental status.  There was concern that she may have developed a UTI and was started on Rocephin.  She was also noted to be short of breath and was hypoxic.  Chest x-ray indicated possible pulmonary vascular congestion.  She is admitted to the hospital.  It was noted that she was having difficulty swallowing and her shortness of breath may in fact be related to aspiration.  Speech therapy is evaluating.  Assessment & Plan:   Active Problems:   HTN (hypertension)   Hyperlipidemia   Hypothyroid   Lower urinary tract infectious disease   Chronic systolic CHF (congestive heart failure) (HCC)   Acute respiratory failure with hypoxia (HCC)   Thrombocytopenia (HCC)   Encephalopathy   1. Acute respiratory failure with hypoxia.  Initially thought to be due to decompensated CHF but likely secondary to aspiration.  Weaning down oxygen as tolerated.  Overall shortness of breath appears to have improved. She is due for swallow evaluation this morning.  2. Chronic systolic congestive heart failure.  She received 1 dose of Lasix in the emergency room.  Upon evaluation, she does not appear to be particularly volume overloaded.  Chest x-ray does not appear to be markedly different compared to prior films.  She does not have any pedal edema.  We will hold off on further diuresis.  Ejection fraction is 30% on echocardiogram. 3. Dysphagia with possible aspiration.  Patient was noted to be choking while drinking water.  Speech therapy is evaluating. MBS this morning.   4. Encephalopathy related to hypoxia and UTI.  Patient was less responsive on admission likely due to hypoxia.  This has since improved after having oxygen applied.  She appears to be approaching  baseline. She is arousable but still lethargic.  5. Hypertension.  Blood pressures currently stable 6. Hyperlipidemia.  Continue on statin 7. Hypothyroidism.  Continue on Synthroid 8. Thrombocytopenia.  Baseline labs are not available, since last lab values in our system from 2014.  At that time, she had mild thrombocytopenia with a platelet count of 125.  She does not have any evidence of bleeding at this time we will continue to monitor 9. Urinary tract infection.  Klebsiella oxytoca sensitive to Rocephin and macrobid and cipro.  Will continue ceftriaxone IV for now given that patient is not taking p.o. well.  10. Chronic kidney disease stage II-III.  Creatinine appears to be near baseline.  Continue to monitor. 11. Hypernatremia.  Suspect related to decreased p.o. intake and volume depletion.  Started on hypotonic fluids.  Slightly improved, following. Continue IVF for now as p.o. Intake remains poor.    DVT prophylaxis: SCDs Code Status: DNR Family Communication: Discussed with family at the bedside Disposition Plan: Return to nursing facility on discharge   Consultants: SLP    Procedures:  Echo:- Left ventricle: The cavity size was normal. Wall thickness was   normal. Systolic function was severely reduced. The estimated  ejection fraction was 30%. Diffuse hypokinesis. Doppler  parameters are consistent with abnormal left ventricular  relaxation (grade 1 diastolic dysfunction). Doppler parameters  are consistent with high ventricular filling pressure. - Regional wall motion abnormality: Akinesis of the basal-mid  inferoseptal myocardium; severe hypokinesis of the apical septal  myocardium; hypokinesis of the mid anterior  and mid anteroseptal myocardium; moderate hypokinesis of the apical myocardium; mild hypokinesis of the apical lateral myocardium. - Aortic valve: Mildly to moderately calcified annulus. Trileaflet; mildly thickened leaflets. There was severe regurgitation. - Aorta:  Mild ascending aortic dilatation. Diameter 3.9 cm. - Mitral valve: There was moderate regurgitation.  - Tricuspid valve: There was mild regurgitation.  Antimicrobials:  Rocephin 10/21 >  Subjective: She continues to have poor oral intake.   Objective: Vitals:   06/11/17 1400 06/11/17 2039 06/11/17 2100 06/12/17 0439  BP: (!) 175/77  (!) 175/80 (!) 167/79  Pulse: 93  100 93  Resp: 18  17 18   Temp: 98.6 F (37 C)  (!) 97.5 F (36.4 C) (!) 97.5 F (36.4 C)  TempSrc:   Oral Oral  SpO2: 96% 95% 99% 98%  Weight:      Height:        Intake/Output Summary (Last 24 hours) at 06/12/17 1029 Last data filed at 06/11/17 1900  Gross per 24 hour  Intake           241.25 ml  Output                0 ml  Net           241.25 ml   Filed Weights   06/09/17 0328  Weight: 69.8 kg (153 lb 14.1 oz)    Examination:  General exam: Appears calm and comfortable, mucous membranes are dry  Respiratory system: Coarse breath sounds at bases. Respiratory effort normal. Cardiovascular system: S1 & S2 heard, RRR. No JVD, murmurs, rubs, gallops or clicks. No pedal edema. Gastrointestinal system: Abdomen is nondistended, soft and nontender. No organomegaly or masses felt. Normal bowel sounds heard. Central nervous system:  No focal neurological deficits. Extremities: Symmetric 5 x 5 power. Skin: No rashes, lesions or ulcers Psychiatry: Unable to assess since patient is difficult to comprehend    Data Reviewed: I have personally reviewed following labs and imaging studies  CBC:  Recent Labs Lab 06/08/17 2134 06/09/17 0704 06/10/17 0956 06/11/17 0943 06/12/17 0418  WBC 7.0 4.6 3.9* 4.0 4.2  NEUTROABS 6.5  --   --   --   --   HGB 12.0 11.3* 11.2* 11.9* 12.1  HCT 36.2 34.9* 35.2* 37.8 38.4  MCV 88.9 90.2 91.4 91.5 92.1  PLT PLATELETS APPEAR DECREASED 73* 77* 82* 88*   Basic Metabolic Panel:  Recent Labs Lab 06/08/17 2133 06/09/17 0704 06/10/17 0956 06/11/17 0943 06/12/17 0418    NA 144 142 148* 152* 147*  K 4.6 3.5 3.9 4.1 4.2  CL 107 110 114* 117* 113*  CO2 27 23 27 25 26   GLUCOSE 131* 126* 119* 111* 128*  BUN 32* 32* 36* 34* 31*  CREATININE 1.25* 0.95 0.91 0.85 0.73  CALCIUM 9.1 8.2* 8.7* 9.1 8.9   GFR: Estimated Creatinine Clearance: 47.4 mL/min (by C-G formula based on SCr of 0.73 mg/dL). Liver Function Tests:  Recent Labs Lab 06/08/17 2133 06/09/17 0704  AST 31 30  ALT 16 18  ALKPHOS 86 79  BILITOT 1.4* 0.9  PROT 6.6 5.9*  ALBUMIN 3.1* 2.7*   No results for input(s): LIPASE, AMYLASE in the last 168 hours. No results for input(s): AMMONIA in the last 168 hours. Coagulation Profile: No results for input(s): INR, PROTIME in the last 168 hours. Cardiac Enzymes: No results for input(s): CKTOTAL, CKMB, CKMBINDEX, TROPONINI in the last 168 hours. BNP (last 3 results) No results for input(s): PROBNP in the last 8760 hours.  HbA1C: No results for input(s): HGBA1C in the last 72 hours. CBG:  Recent Labs Lab 06/08/17 2131  GLUCAP 113*   Lipid Profile: No results for input(s): CHOL, HDL, LDLCALC, TRIG, CHOLHDL, LDLDIRECT in the last 72 hours. Thyroid Function Tests: No results for input(s): TSH, T4TOTAL, FREET4, T3FREE, THYROIDAB in the last 72 hours. Anemia Panel: No results for input(s): VITAMINB12, FOLATE, FERRITIN, TIBC, IRON, RETICCTPCT in the last 72 hours. Sepsis Labs: No results for input(s): PROCALCITON, LATICACIDVEN in the last 168 hours.  Recent Results (from the past 240 hour(s))  Culture, Urine     Status: Abnormal   Collection Time: 06/08/17 11:03 PM  Result Value Ref Range Status   Specimen Description URINE, CLEAN CATCH  Final   Special Requests NONE  Final   Culture >=100,000 COLONIES/mL KLEBSIELLA OXYTOCA (A)  Final   Report Status 06/12/2017 FINAL  Final   Organism ID, Bacteria KLEBSIELLA OXYTOCA (A)  Final      Susceptibility   Klebsiella oxytoca - MIC*    AMPICILLIN >=32 RESISTANT Resistant     CEFAZOLIN >=64  RESISTANT Resistant     CEFTRIAXONE 4 SENSITIVE Sensitive     CIPROFLOXACIN <=0.25 SENSITIVE Sensitive     GENTAMICIN <=1 SENSITIVE Sensitive     IMIPENEM <=0.25 SENSITIVE Sensitive     NITROFURANTOIN 32 SENSITIVE Sensitive     TRIMETH/SULFA <=20 SENSITIVE Sensitive     AMPICILLIN/SULBACTAM >=32 RESISTANT Resistant     PIP/TAZO >=128 RESISTANT Resistant     Extended ESBL NEGATIVE Sensitive     * >=100,000 COLONIES/mL KLEBSIELLA OXYTOCA  MRSA PCR Screening     Status: None   Collection Time: 06/09/17  3:56 AM  Result Value Ref Range Status   MRSA by PCR NEGATIVE NEGATIVE Final    Comment:        The GeneXpert MRSA Assay (FDA approved for NASAL specimens only), is one component of a comprehensive MRSA colonization surveillance program. It is not intended to diagnose MRSA infection nor to guide or monitor treatment for MRSA infections.    Radiology Studies: No results found.  Scheduled Meds: . aspirin  81 mg Oral Daily  . chlorhexidine  15 mL Mouth Rinse BID  . guaiFENesin  1,200 mg Oral BID  . levothyroxine  125 mcg Oral QAC breakfast  . mouth rinse  15 mL Mouth Rinse q12n4p  . metoprolol succinate  25 mg Oral QHS  . polyethylene glycol  17 g Oral QODAY  . polyvinyl alcohol  1 drop Both Eyes QID  . simvastatin  40 mg Oral Daily  . Vitamin D (Ergocalciferol)  50,000 Units Oral Q30 days   Continuous Infusions: . sodium chloride 10 mL/hr at 06/09/17 0350  . cefTRIAXone (ROCEPHIN)  IV Stopped (06/11/17 2045)  . dextrose 60 mL/hr at 06/12/17 0759     LOS: 3 days    Time spent: 26 mins  Irwin Brakeman, MD [facility] Pager [phone]  If 7PM-7AM, please contact night-coverage [email or web address] Password [email or web address] 06/12/2017, 10:29 AM

## 2017-06-13 ENCOUNTER — Encounter (HOSPITAL_COMMUNITY): Payer: Self-pay | Admitting: Primary Care

## 2017-06-13 DIAGNOSIS — Z7189 Other specified counseling: Secondary | ICD-10-CM

## 2017-06-13 DIAGNOSIS — Z515 Encounter for palliative care: Secondary | ICD-10-CM

## 2017-06-13 LAB — BASIC METABOLIC PANEL
ANION GAP: 7 (ref 5–15)
BUN: 30 mg/dL — ABNORMAL HIGH (ref 6–20)
CALCIUM: 8.5 mg/dL — AB (ref 8.9–10.3)
CO2: 26 mmol/L (ref 22–32)
CREATININE: 0.86 mg/dL (ref 0.44–1.00)
Chloride: 111 mmol/L (ref 101–111)
GFR, EST NON AFRICAN AMERICAN: 56 mL/min — AB (ref >60)
Glucose, Bld: 140 mg/dL — ABNORMAL HIGH (ref 65–99)
Potassium: 3.8 mmol/L (ref 3.5–5.1)
SODIUM: 144 mmol/L (ref 135–145)

## 2017-06-13 MED ORDER — METOPROLOL TARTRATE 5 MG/5ML IV SOLN
5.0000 mg | Freq: Four times a day (QID) | INTRAVENOUS | Status: DC
  Administered 2017-06-13 – 2017-06-16 (×12): 5 mg via INTRAVENOUS
  Filled 2017-06-13 (×13): qty 5

## 2017-06-13 NOTE — Care Management (Signed)
PT eval discontinued as pt is a nursing home resident and does not require PT eval to return to facility.

## 2017-06-13 NOTE — Progress Notes (Signed)
PROGRESS NOTE    Allison Collier  WCH:[id number] DOB: [birth year] DOA: 06/08/2017 PCP: Sharion Balloon, FNP   Brief Narrative:  83 year old female with a history of systolic CHF, hypertension and hypothyroidism, was brought to the hospital with change in mental status.  There was concern that she may have developed a UTI and was started on Rocephin.  She was also noted to be short of breath and was hypoxic.  Chest x-ray indicated possible pulmonary vascular congestion.  She is admitted to the hospital.  It was noted that she was having difficulty swallowing and her shortness of breath may in fact be related to aspiration.  Speech therapy is evaluating.  Assessment & Plan:   Active Problems:   HTN (hypertension)   Hyperlipidemia   Hypothyroid   Lower urinary tract infectious disease   Chronic systolic CHF (congestive heart failure) (HCC)   Acute respiratory failure with hypoxia (HCC)   Thrombocytopenia (HCC)   Encephalopathy   1. Acute respiratory failure with hypoxia.  Initially thought to be due to decompensated CHF but likely secondary to aspiration.  Weaning down oxygen as tolerated.  Overall shortness of breath appears to have improved.  2. Chronic systolic congestive heart failure.  She received 1 dose of Lasix in the emergency room.  Upon evaluation, she does not appear to be particularly volume overloaded.  Chest x-ray does not appear to be markedly different compared to prior films.  She does not have any pedal edema.  We will hold off on further diuresis.  Ejection fraction is 30% on echocardiogram. 3. Dysphagia with high risk for aspiration.  Patient performed poorly during MBS.  I spoke with SLP. They have recommended palliative medicine consult.  Pt has had a PEG before and I asked for palliative to consult for goals of care.  I explained to daughter that there are a lot of risks and complications associated with a PEG.  4. Encephalopathy related to hypoxia and UTI.  Pt has  underlying dementia and has been sundowning nightly.  She is arousable but lethargic during the day likely because she is not sleeping at night.  Family at bedside is helping a lot during the day.   5. Hypertension.  Blood pressures currently stable 6. Afib with RVR - exacerbated by agitation and not having been given her oral metoprolol for last 2 days, will give IV for now. Follow.  7. Hyperlipidemia.  Continue on statin 8. Hypothyroidism.  Continue on Synthroid 9. Thrombocytopenia.  Baseline labs are not available, since last lab values in our system from 2014.  At that time, she had mild thrombocytopenia with a platelet count of 125.  She does not have any evidence of bleeding at this time we will continue to monitor. SCDs for DVT prophylaxis.  10. Urinary tract infection.  Klebsiella oxytoca sensitive to Rocephin and macrobid and cipro.  Final dose of ceftriaxone IV today.   11. Chronic kidney disease stage II-III.  Creatinine appears to be near baseline.  Continue to monitor. 12. Hypernatremia.  Suspect related to decreased p.o. intake and volume depletion.  Started on hypotonic fluids.  Improving, following. Continue IVF for now as p.o. Intake remains poor and await palliative medicine consult and recommendations.    DVT prophylaxis: SCDs Code Status: DNR Family Communication: Discussed with family at the bedside Disposition Plan: Return to nursing facility on discharge   Consultants: SLP    Procedures:  Echo:- Left ventricle: The cavity size was normal. Wall thickness was  normal. Systolic function was severely reduced. The estimated  ejection fraction was 30%. Diffuse hypokinesis. Doppler  parameters are consistent with abnormal left ventricular  relaxation (grade 1 diastolic dysfunction). Doppler parameters  are consistent with high ventricular filling pressure. - Regional wall motion abnormality: Akinesis of the basal-mid  inferoseptal myocardium; severe hypokinesis of the apical  septal  myocardium; hypokinesis of the mid anterior and mid anteroseptal myocardium; moderate hypokinesis of the apical myocardium; mild hypokinesis of the apical lateral myocardium. - Aortic valve: Mildly to moderately calcified annulus. Trileaflet; mildly thickened leaflets. There was severe regurgitation. - Aorta: Mild ascending aortic dilatation. Diameter 3.9 cm. - Mitral valve: There was moderate regurgitation.  - Tricuspid valve: There was mild regurgitation.  Antimicrobials:  Rocephin 10/21 >  Subjective: She is demented, but arousable, in/out of sleepiness   Objective: Vitals:   06/12/17 1450 06/12/17 2003 06/12/17 2306 06/13/17 0751  BP: (!) 159/79  127/76 137/75  Pulse: 96  60 99  Resp: 16  18 18   Temp: 97.8 F (36.6 C)  98.3 F (36.8 C) 98.6 F (37 C)  TempSrc: Axillary  Axillary Axillary  SpO2: 98% 97% 98% 98%  Weight:      Height:        Intake/Output Summary (Last 24 hours) at 06/13/17 0843 Last data filed at 06/12/17 1040  Gross per 24 hour  Intake          1434.75 ml  Output                0 ml  Net          1434.75 ml   Filed Weights   06/09/17 0328  Weight: 69.8 kg (153 lb 14.1 oz)    Examination:  General exam: sleeping but arousable, mucous membranes are dry  Respiratory system: Coarse breath sounds at bases. Respiratory effort normal. Cardiovascular system: S1 & S2 heard, RRR. No JVD, murmurs, rubs, gallops or clicks. No pedal edema. Gastrointestinal system: Abdomen is nondistended, soft and nontender. No organomegaly or masses felt. Normal bowel sounds heard. Central nervous system:  No focal neurological deficits. Extremities: Symmetric 5 x 5 power. Skin: No rashes, lesions or ulcers Psychiatry: demented  Data Reviewed: I have personally reviewed following labs and imaging studies  CBC:  Recent Labs Lab 06/08/17 2134 06/09/17 0704 06/10/17 0956 06/11/17 0943 06/12/17 0418  WBC 7.0 4.6 3.9* 4.0 4.2  NEUTROABS 6.5  --   --   --    --   HGB 12.0 11.3* 11.2* 11.9* 12.1  HCT 36.2 34.9* 35.2* 37.8 38.4  MCV 88.9 90.2 91.4 91.5 92.1  PLT PLATELETS APPEAR DECREASED 73* 77* 82* 88*   Basic Metabolic Panel:  Recent Labs Lab 06/09/17 0704 06/10/17 0956 06/11/17 0943 06/12/17 0418 06/13/17 0531  NA 142 148* 152* 147* 144  K 3.5 3.9 4.1 4.2 3.8  CL 110 114* 117* 113* 111  CO2 23 27 25 26 26   GLUCOSE 126* 119* 111* 128* 140*  BUN 32* 36* 34* 31* 30*  CREATININE 0.95 0.91 0.85 0.73 0.86  CALCIUM 8.2* 8.7* 9.1 8.9 8.5*   GFR: Estimated Creatinine Clearance: 44.1 mL/min (by C-G formula based on SCr of 0.86 mg/dL). Liver Function Tests:  Recent Labs Lab 06/08/17 2133 06/09/17 0704  AST 31 30  ALT 16 18  ALKPHOS 86 79  BILITOT 1.4* 0.9  PROT 6.6 5.9*  ALBUMIN 3.1* 2.7*   No results for input(s): LIPASE, AMYLASE in the last 168 hours. No results for input(s):  AMMONIA in the last 168 hours. Coagulation Profile: No results for input(s): INR, PROTIME in the last 168 hours. Cardiac Enzymes: No results for input(s): CKTOTAL, CKMB, CKMBINDEX, TROPONINI in the last 168 hours. BNP (last 3 results) No results for input(s): PROBNP in the last 8760 hours. HbA1C: No results for input(s): HGBA1C in the last 72 hours. CBG:  Recent Labs Lab 06/08/17 2131  GLUCAP 113*   Lipid Profile: No results for input(s): CHOL, HDL, LDLCALC, TRIG, CHOLHDL, LDLDIRECT in the last 72 hours. Thyroid Function Tests: No results for input(s): TSH, T4TOTAL, FREET4, T3FREE, THYROIDAB in the last 72 hours. Anemia Panel: No results for input(s): VITAMINB12, FOLATE, FERRITIN, TIBC, IRON, RETICCTPCT in the last 72 hours. Sepsis Labs: No results for input(s): PROCALCITON, LATICACIDVEN in the last 168 hours.  Recent Results (from the past 240 hour(s))  Culture, Urine     Status: Abnormal   Collection Time: 06/08/17 11:03 PM  Result Value Ref Range Status   Specimen Description URINE, CLEAN CATCH  Final   Special Requests NONE  Final     Culture >=100,000 COLONIES/mL KLEBSIELLA OXYTOCA (A)  Final   Report Status 06/12/2017 FINAL  Final   Organism ID, Bacteria KLEBSIELLA OXYTOCA (A)  Final      Susceptibility   Klebsiella oxytoca - MIC*    AMPICILLIN >=32 RESISTANT Resistant     CEFAZOLIN >=64 RESISTANT Resistant     CEFTRIAXONE 4 SENSITIVE Sensitive     CIPROFLOXACIN <=0.25 SENSITIVE Sensitive     GENTAMICIN <=1 SENSITIVE Sensitive     IMIPENEM <=0.25 SENSITIVE Sensitive     NITROFURANTOIN 32 SENSITIVE Sensitive     TRIMETH/SULFA <=20 SENSITIVE Sensitive     AMPICILLIN/SULBACTAM >=32 RESISTANT Resistant     PIP/TAZO >=128 RESISTANT Resistant     Extended ESBL NEGATIVE Sensitive     * >=100,000 COLONIES/mL KLEBSIELLA OXYTOCA  MRSA PCR Screening     Status: None   Collection Time: 06/09/17  3:56 AM  Result Value Ref Range Status   MRSA by PCR NEGATIVE NEGATIVE Final    Comment:        The GeneXpert MRSA Assay (FDA approved for NASAL specimens only), is one component of a comprehensive MRSA colonization surveillance program. It is not intended to diagnose MRSA infection nor to guide or monitor treatment for MRSA infections.    Radiology Studies: No results found.  Scheduled Meds: . aspirin  81 mg Oral Daily  . chlorhexidine  15 mL Mouth Rinse BID  . guaiFENesin  1,200 mg Oral BID  . levothyroxine  125 mcg Oral QAC breakfast  . mouth rinse  15 mL Mouth Rinse q12n4p  . metoprolol tartrate  5 mg Intravenous Q6H  . polyethylene glycol  17 g Oral QODAY  . polyvinyl alcohol  1 drop Both Eyes QID  . simvastatin  40 mg Oral Daily  . Vitamin D (Ergocalciferol)  50,000 Units Oral Q30 days   Continuous Infusions: . sodium chloride 10 mL/hr at 06/09/17 0350  . cefTRIAXone (ROCEPHIN)  IV Stopped (06/12/17 2956)  . dextrose 70 mL/hr at 06/13/17 0833     LOS: 4 days   Time spent: 24 mins  Irwin Brakeman, MD [facility] Pager [phone]  If 7PM-7AM, please contact  night-coverage [email or web address] Password [email or web address] 06/13/2017, 8:43 AM

## 2017-06-13 NOTE — Progress Notes (Signed)
Pt HR sustaining between 140s-160s. Pt is really agitated. Pulling at lines. Pulled out IV. Obtain new IV access. Pt trying to hit staff. Mitts were applied. Vitals are stable. MD made aware. New orders were followed. Will continue to monitor patient.

## 2017-06-13 NOTE — Progress Notes (Signed)
Pt is sleeping and will not not rouse enough for oral medications.

## 2017-06-13 NOTE — Progress Notes (Signed)
SLP Cancellation Note  Patient Details Name: Allison Collier MRN: [id number] DOB: Jul 23, 1921   Cancelled treatment:       Reason Eval/Treat Not Completed: Fatigue/lethargy limiting ability to participate; Pt currently not alert enough for safe participation in po trials. Per RN, Pt was active over night and has been lethargic all day. Acknowledge palliative care meeting. SLP will continue to make attempts during acute stay.  Thank you,  Genene Churn, [place]    [place] 06/13/2017, 5:29 PM

## 2017-06-13 NOTE — Consult Note (Signed)
Consultation Note Date: 06/13/2017   Patient Name: Allison Collier  DOB: 1921-03-03  MRN: [id number]  Age / Sex: 83 y.o., female  PCP: Sharion Balloon, FNP Referring Physician: Murlean Iba, MD  Reason for Consultation: Establishing goals of care and Psychosocial/spiritual support  HPI/Patient Profile: 83 y.o. female  with past medical history of high blood pressure and cholesterol, chronic systolic heart failure, coronary artery disease, history of esophageal cancer with PEG tube, dementia, chronic kidney disease stage II, resident of [facility] admitted on 06/08/2017 with acute respiratory failure related to CHF and aspiration.   Clinical Assessment and Goals of Care: Mrs. Console is resting quietly in bed. She does not open eyes when I touch her or call her name. She appears very frail and weak. Family and bedside includes son Brianny Soulliere, daughters Royetta Asal, and Bethena Roys. We go to my office for a family meeting, but Ladell Pier states that he must return to work.  We talk about Mrs. Alonge health history, her work history, her functional status. Family states that she is been in [facility] for approximately 3 to 4 years. They share that she has had a decreased appetite over the last several months, but had been walking, feeding self up until a week ago.  We review our current treatment plan. I share a diagram of the chronic illness pathway, what is normal and expected. We talk about decision making. We also talk about allow a natural death (DNR), and also the concept of let nature take its course.  We talk about 24 to 48 hours for outcomes, that Mrs. Birkhead body will decide whether she is able to overcome.  We talk about disposition to SNF with hospice, or the hospice of [facility]. There are still 3 siblings are not present at this time, and I encourage family to take time,  listen to what's later on their hearts for their mother. I share that we will talk again tomorrow. All questions answered.  Healthcare power of attorney NEXT OF KIN - daughter Leona Singleton is primary contact person/decision-maker. There are 4 other daughters including Martie Round, Fayelynn Distel, Faye, Strohman, son Channah Godeaux.   SUMMARY OF RECOMMENDATIONS   24 to 48 hours for outcomes. Family is considering hospice home of [place].  At this point, treat the treatable, no CPR, no intubation, no PEG tube.   Code Status/Advance Care Planning:  DNR - we discussed the concepts of allow a natural death, and let nature take its course.  Symptom Management:   per hospitalist, no additional needs at this time.  Palliative Prophylaxis:   Aspiration and Turn Reposition  Additional Recommendations (Limitations, Scope, Preferences):  No Artificial Feeding and At this point, treat the treatable, no CPR, no intubation, no PEG tube  Psycho-social/Spiritual:   Desire for further Chaplaincy support:yes  Additional Recommendations: Caregiving  Support/Resources and Education on Hospice  Prognosis:   < 3 months, would not be surprising based on frailty, aspiration, bedbound status, advanced dementia  Discharge Planning: Family is considering hospice home of [facility]      Primary Diagnoses: Present on Admission: . Acute respiratory failure with hypoxia ([place]) . Thrombocytopenia ([place]) . Lower urinary tract infectious disease . HTN (hypertension) . Hypothyroid . Chronic systolic CHF (congestive heart failure) ([place]) . Hyperlipidemia . Encephalopathy   I have reviewed the medical record, interviewed the patient and family, and examined the patient. The following aspects are pertinent.  Past Medical History:  Diagnosis Date  . Anemia   . Arthritis   . CAD (coronary artery disease)    Distant MI  . Cancer (HCC)    Esophageal  . Cardiomyopathy ([place])     EF  . CKD (chronic kidney disease) stage 2, GFR 60-89 ml/min   . Dementia   . GERD (gastroesophageal reflux disease)   . Hyperlipidemia   . Hypertension   . Hypothyroidism   . Osteoporosis   . Pleural effusion   . Thyroid disease    Social History   Social History  . Marital status: Widowed    Spouse name: N/A  . Number of children: 7  . Years of education: N/A   Social History Main Topics  . Smoking status: Never Smoker  . Smokeless tobacco: Current User    Types: Snuff  . Alcohol use No  . Drug use: No  . Sexual activity: Not Currently   Other Topics Concern  . None   Social History Narrative   Staying at [facility]   History reviewed. No pertinent family history. Scheduled Meds: . aspirin  81 mg Oral Daily  . chlorhexidine  15 mL Mouth Rinse BID  . guaiFENesin  1,200 mg Oral BID  . levothyroxine  125 mcg Oral QAC breakfast  . mouth rinse  15 mL Mouth Rinse q12n4p  . metoprolol tartrate  5 mg Intravenous Q6H  . polyethylene glycol  17 g Oral QODAY  . polyvinyl alcohol  1 drop Both Eyes QID  . simvastatin  40 mg Oral Daily  . Vitamin D (Ergocalciferol)  50,000 Units Oral Q30 days   Continuous Infusions: . sodium chloride 10 mL/hr at 06/09/17 0350  . cefTRIAXone (ROCEPHIN)  IV Stopped (06/12/17 2831)  . dextrose 70 mL/hr at 06/13/17 0833   PRN Meds:.acetaminophen, hydrALAZINE, ipratropium-albuterol, ondansetron **OR** ondansetron (ZOFRAN) IV, RESOURCE THICKENUP CLEAR Medications Prior to Admission:  Prior to Admission medications   Medication Sig Start Date End Date Taking? Authorizing Provider  aspirin 81 MG chewable tablet Chew 81 mg by mouth daily.   Yes [provider]  Cholecalciferol 50000 units TABS Take 50,000 Units by mouth every 30 (thirty) days.   Yes [provider]  furosemide (LASIX) 20 MG tablet Take 20 mg by mouth daily.   Yes [provider]  levothyroxine (SYNTHROID, LEVOTHROID) 125 MCG tablet Take 125 mcg by  mouth daily before breakfast.   Yes [provider]  Melatonin 3 MG CAPS Take 3 mg by mouth at bedtime.   Yes [provider]  Menthol, Topical Analgesic, 5 % GEL Apply 1 application topically every 4 (four) hours.   Yes [provider]  metoprolol succinate (TOPROL XL) 25 MG 24 hr tablet Take 1 tablet (25 mg total) by mouth at bedtime. 07/25/16  Yes Minus Breeding, MD  multivitamin-lutein [facility]) CAPS capsule Take 1 capsule by mouth 2 (two) times daily.   Yes [provider]  nitroGLYCERIN (NITROSTAT) 0.4 MG SL tablet Place 0.4 mg under the tongue every 5 (five) minutes as  needed. For chest pains   Yes [provider]  Polyethyl Glycol-Propyl Glycol (SYSTANE) 0.4-0.3 % GEL ophthalmic gel Place 1 application into both eyes 4 (four) times daily.   Yes [provider]  polyethylene glycol (MIRALAX / GLYCOLAX) packet Take 17 g by mouth every other day.   Yes [provider]  senna (SENOKOT) 8.6 MG tablet Take 1 tablet by mouth 2 (two) times daily.   Yes [provider]   Allergies  Allergen Reactions  . Sulfa Antibiotics     Reaction unknown   Review of Systems  Unable to perform ROS: Dementia    Physical Exam  Constitutional: No distress.  HENT:  Head: Atraumatic.  Temporal wasting  Cardiovascular: Normal rate.   Abdominal: Soft. She exhibits no distension.  Musculoskeletal: She exhibits no edema.  Skin: Skin is warm and dry.  Nursing note and vitals reviewed.   Vital Signs: BP (!) 145/56 (BP Location: Right Arm)   Pulse 76   Temp 98.6 F (37 C) (Axillary)   Resp 18   Ht 6' (1.829 m)   Wt 69.8 kg (153 lb 14.1 oz)   SpO2 98%   BMI 20.87 kg/m  Pain Assessment: PAINAD   Pain Score: 0-No pain   SpO2: SpO2: 98 % O2 Device:SpO2: 98 % O2 Flow Rate: .O2 Flow Rate (L/min): 0 L/min  IO: Intake/output summary: No intake or output data in the 24 hours ending 06/13/17 1241  LBM: Last BM Date:  (Pt  unable to remember) Baseline Weight: Weight: 69.8 kg (153 lb 14.1 oz) Most recent weight: Weight: 69.8 kg (153 lb 14.1 oz)     Palliative Assessment/Data:   Flowsheet Rows     Most Recent Value  Intake Tab  Referral Department  Hospitalist  Unit at Time of Referral  Med/Surg Unit  Palliative Care Primary Diagnosis  Pulmonary  Date Notified  06/12/17  Palliative Care Type  New Palliative care  Reason for referral  Clarify Goals of Care  Date of Admission  06/08/17  Date first seen by Palliative Care  06/12/17  # of days Palliative referral response time  0 Day(s)  # of days IP prior to Palliative referral  4  Clinical Assessment  Palliative Performance Scale Score  20%  Pain Max last 24 hours  Not able to report  Pain Min Last 24 hours  Not able to report  Dyspnea Max Last 24 Hours  Not able to report  Dyspnea Min Last 24 hours  Not able to report  Psychosocial & Spiritual Assessment  Palliative Care Outcomes  Patient/Family meeting held?  Yes  Who was at the meeting?  Daughters Remo Lipps and [place] at bedside, family meeting with Thayer Headings 10/25  [place] regarding hospice, Provided advance care planning, Provided psychosocial or spiritual support  Patient/Family wishes: Interventions discontinued/not started   Mechanical Ventilation      Time In: 1320 Time Out: 1430 Time Total: 70 minutes Greater than 50%  of this time was spent counseling and coordinating care related to the above assessment and plan.  Signed by: Drue Novel, NP   Please contact Palliative Medicine Team phone at [phone] for questions and concerns.  For individual provider: See Shea Evans

## 2017-06-14 DIAGNOSIS — Z7189 Other specified counseling: Secondary | ICD-10-CM

## 2017-06-14 NOTE — Progress Notes (Signed)
  Speech Language Pathology Treatment: Dysphagia  Patient Details Name: Allison Collier MRN: [id number] DOB: Feb 24, 1921 Today's Date: 06/14/2017 Time: 6440-3474 SLP Time Calculation (min) (ACUTE ONLY): 17 min  Assessment / Plan / Recommendation Clinical Impression  Dysphagia treatment provided. Pt was asleep upon arrival but easily wakened; appeared comfortable and relaxed during tx, speaking but incoherent. Pt consumed small bites of puree and honey-thick liquids with consistent delayed cough. Family at bedside acknowledged aspiration risk as SLP had reviewed MBS results in depth with them on 10/24. While there continues to be a high risk of aspiration, dysphagia 1/ honey thick liquids continues to appear to be safest diet at this time. Will continue to follow for diet tolerance/ family education.  HPI HPI: MargaretHicksis a 83 y.o.female,with history of hypertension, hypothyroidism, hyperlipidemia, chronic systolic CHF, CAD was brought to the hospital from [facility] for altered mental status. No other history obtainable. Apparently patient fell yesterday at [facility] clinic. In the ED she was found to have UTI and started on ceftriaxone. Also given 2 L of IV normal saline.Chest x-ray showed increased pulmonary vascular congestion. At this time patient is in respiratory distress, with increased secretions. Family reports Pt with PMH significant for esophageal cancer treated with chemo/rad years ago and need for esophageal dilation in the past with Dr. Gala Romney. BSE ordered. Head CT shows: Remote right thalamic lacunar infarct with additional small remote left cerebellar infarct.      SLP Plan  Continue with current plan of care       Recommendations  Diet recommendations: Dysphagia 1 (puree);Honey-thick liquid Liquids provided via: Teaspoon Medication Administration: Crushed with puree Supervision: Staff to assist with self feeding;Full supervision/cueing for compensatory  strategies Compensations: Minimize environmental distractions;Slow rate;Small sips/bites Postural Changes and/or Swallow Maneuvers: Seated upright 90 degrees                Oral Care Recommendations: Oral care BID Follow up Recommendations: 24 hour supervision/assistance SLP Visit Diagnosis: Dysphagia, oropharyngeal phase (R13.12) Plan: Continue with current plan of care       GO                Kern Reap, MA, CCC-SLP 06/14/2017, 6:15 PM

## 2017-06-14 NOTE — Clinical Social Work Note (Signed)
LCSW provided status update to [employer] at [facility].          Adam Sanjuan, Clydene Pugh, LCSW

## 2017-06-14 NOTE — Care Management Important Message (Signed)
Important Message  Patient Details  Name: Allison Collier MRN: [id number] Date of Birth: [birth year]   Medicare Important Message Given:  Yes    Sherald Barge, RN 06/14/2017, 11:59 AM

## 2017-06-14 NOTE — Progress Notes (Signed)
PROGRESS NOTE    Allison Collier  JJH:[id number] DOB: 1920-09-07 DOA: 06/08/2017 PCP: Sharion Balloon, FNP   Brief Narrative:  83 year old female with a history of systolic CHF, hypertension and hypothyroidism, was brought to the hospital with change in mental status.  There was concern that she may have developed a UTI and was started on Rocephin.  She was also noted to be short of breath and was hypoxic.  Chest x-ray indicated possible pulmonary vascular congestion.  She is admitted to the hospital.  It was noted that she was having difficulty swallowing and her shortness of breath may in fact be related to aspiration.  Speech therapy is evaluating.  Assessment & Plan:   Active Problems:   HTN (hypertension)   Hyperlipidemia   Hypothyroid   Lower urinary tract infectious disease   Chronic systolic CHF (congestive heart failure) (HCC)   Acute respiratory failure with hypoxia (HCC)   Thrombocytopenia (HCC)   Encephalopathy   Palliative care encounter   Goals of care, counseling/discussion   DNR (do not resuscitate) discussion   Encounter for hospice care discussion   1. Acute respiratory failure with hypoxia.  Initially thought to be due to decompensated CHF but likely secondary to aspiration.  Weaning down oxygen as tolerated.  Overall shortness of breath seems to have improved.  2. Chronic systolic congestive heart failure.  She received 1 dose of Lasix in the emergency room.  Upon evaluation, she does not appear to be particularly volume overloaded.  Chest x-ray does not appear to be markedly different compared to prior films.  She does not have any pedal edema.  We will hold off on further diuresis.  Ejection fraction is 30% on echocardiogram. 3. Dysphagia with high risk for aspiration.  Patient performed poorly during MBS.  I spoke with SLP. They have recommended palliative medicine consult.  Family met with palliative medicine and decided against PEG and making a decision about  hospice placement.    4. Encephalopathy related to hypoxia and UTI.  Pt has underlying dementia and has been sundowning nightly.  She is arousable but lethargic during the day likely because she is not sleeping at night.  Family at bedside is helping a lot during the day.   5. Hypertension.  Blood pressures currently stable 6. Afib with RVR - exacerbated by agitation and not having been given her oral metoprolol for last 2 days, will give IV for now. Follow.  7. Hyperlipidemia.  Continue on statin 8. Hypothyroidism.  Continue on Synthroid 9. Thrombocytopenia.  Baseline labs are not available, since last lab values in our system from 2014.  At that time, she had mild thrombocytopenia with a platelet count of 125.  She does not have any evidence of bleeding at this time we will continue to monitor. SCDs for DVT prophylaxis.  10. Urinary tract infection.  Klebsiella oxytoca sensitive to Rocephin and macrobid and cipro.  Treated with IV ceftriaxone.   11. Chronic kidney disease stage II-III.  Creatinine appears to be near baseline.  Continue to monitor. 12. Hypernatremia.  Suspect related to decreased p.o. intake and volume depletion.  Started on hypotonic fluids.  Improving, following. Continue IVF for now as p.o. Intake remains poor and await palliative medicine consult and recommendations.    DVT prophylaxis: SCDs Code Status: DNR Family Communication: Discussed with family at the bedside Disposition Plan: Family deciding about hospice home placement [facility])   Consultants: SLP    Procedures:  Echo:- Left ventricle: The cavity size  was normal. Wall thickness was   normal. Systolic function was severely reduced. The estimated  ejection fraction was 30%. Diffuse hypokinesis. Doppler  parameters are consistent with abnormal left ventricular  relaxation (grade 1 diastolic dysfunction). Doppler parameters  are consistent with high ventricular filling pressure. - Regional wall motion  abnormality: Akinesis of the basal-mid  inferoseptal myocardium; severe hypokinesis of the apical septal  myocardium; hypokinesis of the mid anterior and mid anteroseptal myocardium; moderate hypokinesis of the apical myocardium; mild hypokinesis of the apical lateral myocardium. - Aortic valve: Mildly to moderately calcified annulus. Trileaflet; mildly thickened leaflets. There was severe regurgitation. - Aorta: Mild ascending aortic dilatation. Diameter 3.9 cm. - Mitral valve: There was moderate regurgitation.  - Tricuspid valve: There was mild regurgitation.  Antimicrobials:  Rocephin 10/21 >  Subjective: She is being fed by care techs this morning.    Objective: Vitals:   06/13/17 2203 06/14/17 0015 06/14/17 0620 06/14/17 1228  BP: (!) 159/62 (!) 167/67 (!) 157/69 (!) 150/61  Pulse: 81 94 87 84  Resp: 16  20   Temp: (!) 97.5 F (36.4 C)  98 F (36.7 C)   TempSrc: Axillary  Oral   SpO2: 100%  97%   Weight:      Height:        Intake/Output Summary (Last 24 hours) at 06/14/17 1514 Last data filed at 06/14/17 1449  Gross per 24 hour  Intake          1922.33 ml  Output              830 ml  Net          1092.33 ml   Filed Weights   06/09/17 0328  Weight: 69.8 kg (153 lb 14.1 oz)    Examination:  General exam: awake and eating, mucous membranes are dry, but some improvement noted.  Respiratory system: Coarse breath sounds at bases. Respiratory effort normal. Cardiovascular system: S1 & S2 heard, RRR. No JVD, murmurs, rubs, gallops or clicks. No pedal edema. Gastrointestinal system: Abdomen is nondistended, soft and nontender. No organomegaly or masses felt. Normal bowel sounds heard. Central nervous system:  No focal neurological deficits. Extremities: Symmetric 5 x 5 power. Skin: No rashes, lesions or ulcers Psychiatry: demented  Data Reviewed: I have personally reviewed following labs and imaging studies  CBC:  Recent Labs Lab 06/08/17 2134 06/09/17 0704  06/10/17 0956 06/11/17 0943 06/12/17 0418  WBC 7.0 4.6 3.9* 4.0 4.2  NEUTROABS 6.5  --   --   --   --   HGB 12.0 11.3* 11.2* 11.9* 12.1  HCT 36.2 34.9* 35.2* 37.8 38.4  MCV 88.9 90.2 91.4 91.5 92.1  PLT PLATELETS APPEAR DECREASED 73* 77* 82* 88*   Basic Metabolic Panel:  Recent Labs Lab 06/09/17 0704 06/10/17 0956 06/11/17 0943 06/12/17 0418 06/13/17 0531  NA 142 148* 152* 147* 144  K 3.5 3.9 4.1 4.2 3.8  CL 110 114* 117* 113* 111  CO2 _0 GLUCOSE 126* 119* 111* 128* 140*  BUN 32* 36* 34* 31* 30*  CREATININE 0.95 0.91 0.85 0.73 0.86  CALCIUM 8.2* 8.7* 9.1 8.9 8.5*   GFR: Estimated Creatinine Clearance: 44.1 mL/min (by C-G formula based on SCr of 0.86 mg/dL). Liver Function Tests:  Recent Labs Lab 06/08/17 2133 06/09/17 0704  AST 31 30  ALT 16 18  ALKPHOS 86 79  BILITOT 1.4* 0.9  PROT 6.6 5.9*  ALBUMIN 3.1* 2.7*   No results for  input(s): LIPASE, AMYLASE in the last 168 hours. No results for input(s): AMMONIA in the last 168 hours. Coagulation Profile: No results for input(s): INR, PROTIME in the last 168 hours. Cardiac Enzymes: No results for input(s): CKTOTAL, CKMB, CKMBINDEX, TROPONINI in the last 168 hours. BNP (last 3 results) No results for input(s): PROBNP in the last 8760 hours. HbA1C: No results for input(s): HGBA1C in the last 72 hours. CBG:  Recent Labs Lab 06/08/17 2131  GLUCAP 113*   Lipid Profile: No results for input(s): CHOL, HDL, LDLCALC, TRIG, CHOLHDL, LDLDIRECT in the last 72 hours. Thyroid Function Tests: No results for input(s): TSH, T4TOTAL, FREET4, T3FREE, THYROIDAB in the last 72 hours. Anemia Panel: No results for input(s): VITAMINB12, FOLATE, FERRITIN, TIBC, IRON, RETICCTPCT in the last 72 hours. Sepsis Labs: No results for input(s): PROCALCITON, LATICACIDVEN in the last 168 hours.  Recent Results (from the past 240 hour(s))  Culture, Urine     Status: Abnormal   Collection Time: 06/08/17 11:03 PM  Result  Value Ref Range Status   Specimen Description URINE, CLEAN CATCH  Final   Special Requests NONE  Final   Culture >=100,000 COLONIES/mL KLEBSIELLA OXYTOCA (A)  Final   Report Status 06/12/2017 FINAL  Final   Organism ID, Bacteria KLEBSIELLA OXYTOCA (A)  Final      Susceptibility   Klebsiella oxytoca - MIC*    AMPICILLIN >=32 RESISTANT Resistant     CEFAZOLIN >=64 RESISTANT Resistant     CEFTRIAXONE 4 SENSITIVE Sensitive     CIPROFLOXACIN <=0.25 SENSITIVE Sensitive     GENTAMICIN <=1 SENSITIVE Sensitive     IMIPENEM <=0.25 SENSITIVE Sensitive     NITROFURANTOIN 32 SENSITIVE Sensitive     TRIMETH/SULFA <=20 SENSITIVE Sensitive     AMPICILLIN/SULBACTAM >=32 RESISTANT Resistant     PIP/TAZO >=128 RESISTANT Resistant     Extended ESBL NEGATIVE Sensitive     * >=100,000 COLONIES/mL KLEBSIELLA OXYTOCA  MRSA PCR Screening     Status: None   Collection Time: 06/09/17  3:56 AM  Result Value Ref Range Status   MRSA by PCR NEGATIVE NEGATIVE Final    Comment:        The GeneXpert MRSA Assay (FDA approved for NASAL specimens only), is one component of a comprehensive MRSA colonization surveillance program. It is not intended to diagnose MRSA infection nor to guide or monitor treatment for MRSA infections.    Radiology Studies: No results found.  Scheduled Meds: . aspirin  81 mg Oral Daily  . chlorhexidine  15 mL Mouth Rinse BID  . guaiFENesin  1,200 mg Oral BID  . levothyroxine  125 mcg Oral QAC breakfast  . mouth rinse  15 mL Mouth Rinse q12n4p  . metoprolol tartrate  5 mg Intravenous Q6H  . polyethylene glycol  17 g Oral QODAY  . polyvinyl alcohol  1 drop Both Eyes QID  . simvastatin  40 mg Oral Daily  . Vitamin D (Ergocalciferol)  50,000 Units Oral Q30 days   Continuous Infusions: . sodium chloride 10 mL/hr at 06/09/17 0350  . dextrose 70 mL/hr at 06/14/17 1414     LOS: 5 days   Time spent: 22 mins  Irwin Brakeman, MD [facility] Pager [phone]  If  7PM-7AM, please contact night-coverage [email or web address] Password [email or web address] 06/14/2017, 3:14 PM

## 2017-06-14 NOTE — Progress Notes (Signed)
Daily Progress Note   Patient Name: Allison Collier       Date: 06/14/2017 DOB: [birth year]  Age: 83 y.o. MRN#: [id number] Attending Physician: Murlean Iba, MD Primary Care Physician: Sharion Balloon, FNP Admit Date: 06/08/2017  Reason for Consultation/Follow-up: Establishing goals of care and Psychosocial/spiritual support  Subjective: Allison Collier is resting quietly in bed. She will briefly make but not keep eye contact. She looks relatively comfortable. She is unable to make her basic needs known. I asked if she is thirsty, she is able to take a spoonful of thickened liquids, without overt signs of aspiration. There is no family at bedside at this time.  I returned later in the day, and present our daughters [name], and [name].  We talk about her alertness, but her inability to communicate effectively. We talk about her by mouth intake which family states has been minimal.    We talk about what next. Remo Lipps and Bethena Roys states that some of her siblings are requesting more time. We talk about returning to [facility], daughters state they do not wish for her to return there.  I ask if they are interested in another facility, but they say they are not, that it is either the hospital for hospice home. Daughter [name], per her sister's has physical and mental problems, she is requesting that her mother stay in the hospital, but she clearly does not understand her mother's condition.   We talk about giving until Sunday for decision making. I share that we should know by then if Allison Collier is recovering.  Bethena Roys asks how long someone can stay in the hospice home. I asked that Bethena Roys and Remo Lipps meet in the hallway.   I share prognosis with permission, with Remo Lipps and Bethena Roys. I share that the body cannot  live more than a few weeks without food.  We discuss sleeping more, eating and interacting less before death.  I encourage daughters to keep Allison Collier at the center and not their siblings.   Family is requesting 24-48 hours for outcomes. They do not wish to return to [facility]. Daughters Remo Lipps and Bethena Roys are proponents of [facility] [place], but state brother is requesting more time.    Treat the treatable, no CPR, no intubation, No PEG, NO BiPAP.  Length  of Stay: 5  Current Medications: Scheduled Meds:  . aspirin  81 mg Oral Daily  . chlorhexidine  15 mL Mouth Rinse BID  . guaiFENesin  1,200 mg Oral BID  . levothyroxine  125 mcg Oral QAC breakfast  . mouth rinse  15 mL Mouth Rinse q12n4p  . metoprolol tartrate  5 mg Intravenous Q6H  . polyethylene glycol  17 g Oral QODAY  . polyvinyl alcohol  1 drop Both Eyes QID  . simvastatin  40 mg Oral Daily  . Vitamin D (Ergocalciferol)  50,000 Units Oral Q30 days    Continuous Infusions: . sodium chloride 10 mL/hr at 06/09/17 0350  . dextrose 70 mL/hr at 06/13/17 2055    PRN Meds: acetaminophen, hydrALAZINE, ipratropium-albuterol, ondansetron **OR** ondansetron (ZOFRAN) IV, RESOURCE THICKENUP CLEAR  Physical Exam  Constitutional: No distress.  Makes but does not keep eye contact, few word answers at times.   HENT:  Head: Atraumatic.  temporal wasting  Cardiovascular: Normal rate.   Pulmonary/Chest: Effort normal. No respiratory distress.  Abdominal: Soft. She exhibits no distension.  Musculoskeletal: She exhibits no edema.  Neurological: She is alert.  Known dementia  Skin: Skin is warm and dry.  Nursing note and vitals reviewed.           Vital Signs: BP (!) 157/69 (BP Location: Left Arm)   Pulse 87   Temp 98 F (36.7 C) (Oral)   Resp 20   Ht 6' (1.829 m)   Wt 69.8 kg (153 lb 14.1 oz)   SpO2 97%   BMI 20.87 kg/m  SpO2: SpO2: 97 % O2 Device: O2 Device: Not Delivered O2 Flow Rate: O2 Flow Rate  (L/min): 0 L/min  Intake/output summary:  Intake/Output Summary (Last 24 hours) at 06/14/17 1213 Last data filed at 06/14/17 0800  Gross per 24 hour  Intake           1321.5 ml  Output              830 ml  Net            491.5 ml   LBM: Last BM Date:  (Unknown) Baseline Weight: Weight: 69.8 kg (153 lb 14.1 oz) Most recent weight: Weight: 69.8 kg (153 lb 14.1 oz)       Palliative Assessment/Data:    Flowsheet Rows     Most Recent Value  Intake Tab  Referral Department  Hospitalist  Unit at Time of Referral  Med/Surg Unit  Palliative Care Primary Diagnosis  Pulmonary  Date Notified  06/12/17  Palliative Care Type  New Palliative care  Reason for referral  Clarify Goals of Care  Date of Admission  06/08/17  Date first seen by Palliative Care  06/12/17  # of days Palliative referral response time  0 Day(s)  # of days IP prior to Palliative referral  4  Clinical Assessment  Palliative Performance Scale Score  20%  Pain Max last 24 hours  Not able to report  Pain Min Last 24 hours  Not able to report  Dyspnea Max Last 24 Hours  Not able to report  Dyspnea Min Last 24 hours  Not able to report  Psychosocial & Spiritual Assessment  Palliative Care Outcomes  Patient/Family meeting held?  Yes  Who was at the meeting?  Daughters Remo Lipps and Thayer Headings at bedside, family meeting with Thayer Headings 10/25  [place] regarding hospice, Provided advance care planning, Provided psychosocial or spiritual support  Patient/Family wishes: Interventions discontinued/not started  Mechanical Ventilation      Patient Active Problem List   Diagnosis Date Noted  . Palliative care encounter   . Goals of care, counseling/discussion   . DNR (do not resuscitate) discussion   . Encounter for hospice care discussion   . Encephalopathy 06/10/2017  . Acute respiratory failure with hypoxia ([place]) 06/09/2017  . Thrombocytopenia ([place]) 06/09/2017  . Hypernatremia 04/19/2013  . Dysphagia  04/19/2013  . CAP (community acquired pneumonia) 01/16/2013  . Hydropneumothorax 01/16/2013  . Chronic systolic CHF (congestive heart failure) ([place]) 01/16/2013  . Fx metacarpal 01/15/2013  . Pleural effusion on right 01/14/2013  . Metacarpal bone fracture 01/14/2013  . Fall 01/14/2013  . Shingles outbreak 11/21/2012  . Lower urinary tract infectious disease 09/03/2011  . Closed left hip fracture ([place]) 08/30/2011  . Anemia 08/30/2011  . HTN (hypertension) 11/04/2010  . Hyperlipidemia 11/04/2010  . Osteoarthritis 11/04/2010  . GERD (gastroesophageal reflux disease) 11/04/2010  . Osteopenia 11/04/2010  . CAD (coronary artery disease) 11/04/2010  . Hypothyroid 11/04/2010  . Chronic back pain 11/04/2010  . Esophageal cancer ([place]) 11/04/2010    Palliative Care Assessment & Plan   Patient Profile:  83 y.o. female  with past medical history of high blood pressure and cholesterol, chronic systolic heart failure, coronary artery disease, history of esophageal cancer with PEG tube, dementia, chronic kidney disease stage II, resident of [facility] admitted on 06/08/2017 with acute respiratory failure related to CHF and aspiration.   Assessment: CHF and aspiration; fluid overload managed, diet modified, still with poor PO intake.   Recommendations/Plan:  Family is requesting 24-48 hours for outcomes.   They do not wish to return to [facility].   Daughters Remo Lipps and Bethena Roys are proponents of [facility] [place], but state brother is requesting more time.   Goals of Care and Additional Recommendations:  Limitations on Scope of Treatment: Treat the treatable, no CPR, no intubation, No PEG, NO BiPAP.   Code Status:    Code Status Orders        Start     Ordered   06/09/17 0304  Do not attempt resuscitation (DNR)  Continuous    Question Answer Comment  In the event of cardiac or respiratory ARREST Do not call a "code blue"   In the event of cardiac or  respiratory ARREST Do not perform Intubation, CPR, defibrillation or ACLS   In the event of cardiac or respiratory ARREST Use medication by any route, position, wound care, and other measures to relive pain and suffering. May use oxygen, suction and manual treatment of airway obstruction as needed for comfort.      06/09/17 0303    Code Status History    Date Active Date Inactive Code Status Order ID Comments User Context   06/09/2017  3:03 AM 06/09/2017  3:03 AM DNR [id number]  Oswald Hillock, MD ED   04/19/2013  6:33 PM 04/23/2013  3:48 PM DNR [id number]  Caren Griffins, MD ED   01/14/2013 11:25 PM 01/21/2013  3:51 PM Full Code [id number]  Karlyn Agee, MD Inpatient   08/31/2011  3:38 PM 09/03/2011  7:50 PM DNR [id number]  Morene Antu, LPN Inpatient   04/13/538  5:14 AM 08/31/2011  3:38 PM DNR [id number]  Jacqulyn Ducking, RN Inpatient    Advance Directive Documentation     Most Recent Value  Type of Advance Directive  Healthcare Power of Attorney, Out of facility DNR (pink MOST or yellow form)  Pre-existing  out of facility DNR order (yellow form or pink MOST form)  Yellow form placed in chart (order not valid for inpatient use)  "MOST" Form in Place?  -       Prognosis:   < 6 weeks or less, would not be surprising based on frailty, functional decline, advanced dementia, minimal by mouth intake.  Discharge Planning:  Family is requesting 24-48 hours for outcomes. Daughters Remo Lipps and Bethena Roys are proponents of hospice home of [place], but state some of the siblings need more time.   Care plan was discussed with nursing staff, case management, social worker, and Dr. Wynetta Emery.  Thank you for allowing the Palliative Medicine Team to assist in the care of this patient.   Time In: 3552 [id number] Time Out: 0930 1440 Total Time 10+15=25 minutes Prolonged Time Billed  no       Greater than 50%  of this time was spent counseling and coordinating care related to the above assessment and  plan.  Drue Novel, NP  Please contact Palliative Medicine Team phone at [phone] for questions and concerns.

## 2017-06-15 LAB — BASIC METABOLIC PANEL
ANION GAP: 8 (ref 5–15)
BUN: 23 mg/dL — ABNORMAL HIGH (ref 6–20)
CO2: 26 mmol/L (ref 22–32)
Calcium: 8.6 mg/dL — ABNORMAL LOW (ref 8.9–10.3)
Chloride: 106 mmol/L (ref 101–111)
Creatinine, Ser: 0.78 mg/dL (ref 0.44–1.00)
Glucose, Bld: 102 mg/dL — ABNORMAL HIGH (ref 65–99)
POTASSIUM: 3.9 mmol/L (ref 3.5–5.1)
SODIUM: 140 mmol/L (ref 135–145)

## 2017-06-15 NOTE — Progress Notes (Signed)
PROGRESS NOTE   Allison Collier  ZDG:[id number] DOB: 08/03/1921 DOA: 06/08/2017 PCP: Sharion Balloon, FNP   Brief Narrative:  83 year old female with a history of systolic CHF, hypertension and hypothyroidism, was brought to the hospital with change in mental status.  There was concern that she may have developed a UTI and was started on Rocephin.  She was also noted to be short of breath and was hypoxic.  Chest x-ray indicated possible pulmonary vascular congestion.  She is admitted to the hospital.  It was noted that she was having difficulty swallowing and her shortness of breath may in fact be related to aspiration.  Speech therapy is evaluating.  Assessment & Plan:   Active Problems:   HTN (hypertension)   Hyperlipidemia   Hypothyroid   Lower urinary tract infectious disease   Chronic systolic CHF (congestive heart failure) (HCC)   Acute respiratory failure with hypoxia (HCC)   Thrombocytopenia (HCC)   Encephalopathy   Palliative care encounter   Goals of care, counseling/discussion   DNR (do not resuscitate) discussion   Encounter for hospice care discussion   1. Acute respiratory failure with hypoxia.  Initially thought to be due to decompensated CHF but likely secondary to aspiration.  Weaning down oxygen as tolerated.  Overall shortness of breath seems to have improved.  2. Chronic systolic congestive heart failure.  She received 1 dose of Lasix in the emergency room.  Upon evaluation, she does not appear to be particularly volume overloaded.  Chest x-ray does not appear to be markedly different compared to prior films.  She does not have any pedal edema.  We will hold off on further diuresis.  Ejection fraction is 30% on echocardiogram. 3. Dysphagia with high risk for aspiration.  Patient performed poorly during MBS.  I spoke with SLP. They have recommended palliative medicine consult.  Family met with palliative medicine and decided against PEG and making a decision about  hospice placement.    4. Encephalopathy related to hypoxia and UTI.  Pt has underlying dementia and has been sundowning nightly.  She is arousable but lethargic during the day likely because she is not sleeping at night.  Family at bedside is helping a lot during the day.   5. Hypertension.  Blood pressures currently stable 6. Afib with RVR - exacerbated by agitation and not having been given her oral metoprolol for last 2 days, will give IV for now. Follow.  7. Hyperlipidemia.  Continue on statin 8. Hypothyroidism.  Continue on Synthroid 9. Thrombocytopenia.  Baseline labs are not available, since last lab values in our system from 2014.  At that time, she had mild thrombocytopenia with a platelet count of 125.  She does not have any evidence of bleeding at this time we will continue to monitor. SCDs for DVT prophylaxis.  10. Urinary tract infection.  Klebsiella oxytoca sensitive to Rocephin and macrobid and cipro.  Treated with IV ceftriaxone.   11. Chronic kidney disease stage II-III.  Creatinine appears to be near baseline.  Continue to monitor. 12. Hypernatremia.  Suspect related to decreased p.o. intake and volume depletion.  Started on hypotonic fluids.  Improving, following. Continue IVF for now as p.o. Intake remains poor.    DVT prophylaxis: SCDs Code Status: DNR Family Communication: Discussed with family at the bedside Disposition Plan: Family deciding about hospice home placement [facility]), will have a decision Sunday 10/28   Consultants: SLP    Procedures:  Echo:- Left ventricle: The cavity size was normal.  Wall thickness was   normal. Systolic function was severely reduced. The estimated  ejection fraction was 30%. Diffuse hypokinesis. Doppler  parameters are consistent with abnormal left ventricular  relaxation (grade 1 diastolic dysfunction). Doppler parameters  are consistent with high ventricular filling pressure. - Regional wall motion abnormality: Akinesis of the  basal-mid  inferoseptal myocardium; severe hypokinesis of the apical septal  myocardium; hypokinesis of the mid anterior and mid anteroseptal myocardium; moderate hypokinesis of the apical myocardium; mild hypokinesis of the apical lateral myocardium. - Aortic valve: Mildly to moderately calcified annulus. Trileaflet; mildly thickened leaflets. There was severe regurgitation. - Aorta: Mild ascending aortic dilatation. Diameter 3.9 cm. - Mitral valve: There was moderate regurgitation.  - Tricuspid valve: There was mild regurgitation.  Antimicrobials:  Rocephin 10/21 >10/25  Subjective: She is arousable and denies complaints    Objective: Vitals:   06/14/17 1228 06/14/17 1543 06/14/17 2150 06/15/17 0600  BP: (!) 150/61 (!) 151/67 (!) 149/66 (!) 154/67  Pulse: 84 82 86 84  Resp:  '18 18 18  '$ Temp:  97.7 F (36.5 C) 97.9 F (36.6 C) 97.9 F (36.6 C)  TempSrc:  Axillary Axillary Axillary  SpO2:  99% 99% 97%  Weight:      Height:        Intake/Output Summary (Last 24 hours) at 06/15/17 0904 Last data filed at 06/15/17 0300  Gross per 24 hour  Intake          1680.41 ml  Output              500 ml  Net          1180.41 ml   Filed Weights   06/09/17 0328  Weight: 69.8 kg (153 lb 14.1 oz)   Examination:  General exam: arousable, mouth breathing, denies complaints, NAD. Appears comfortable.   Respiratory system: Coarse breath sounds at bases. Respiratory effort normal. Cardiovascular system: S1 & S2 heard, RRR. No JVD, murmurs, rubs, gallops or clicks. No pedal edema. Gastrointestinal system: Abdomen is nondistended, soft and nontender. No organomegaly or masses felt. Normal bowel sounds heard. Central nervous system:  No focal neurological deficits. Extremities: Symmetric 5 x 5 power. Skin: No rashes, lesions or ulcers Psychiatry: demented  Data Reviewed: I have personally reviewed following labs and imaging studies  CBC:  Recent Labs Lab 06/08/17 2134 06/09/17 0704  06/10/17 0956 06/11/17 0943 06/12/17 0418  WBC 7.0 4.6 3.9* 4.0 4.2  NEUTROABS 6.5  --   --   --   --   HGB 12.0 11.3* 11.2* 11.9* 12.1  HCT 36.2 34.9* 35.2* 37.8 38.4  MCV 88.9 90.2 91.4 91.5 92.1  PLT PLATELETS APPEAR DECREASED 73* 77* 82* 88*   Basic Metabolic Panel:  Recent Labs Lab 06/09/17 0704 06/10/17 0956 06/11/17 0943 06/12/17 0418 06/13/17 0531  NA 142 148* 152* 147* 144  K 3.5 3.9 4.1 4.2 3.8  CL 110 114* 117* 113* 111  CO2 '23 27 25 26 26  '$ GLUCOSE 126* 119* 111* 128* 140*  BUN 32* 36* 34* 31* 30*  CREATININE 0.95 0.91 0.85 0.73 0.86  CALCIUM 8.2* 8.7* 9.1 8.9 8.5*   GFR: Estimated Creatinine Clearance: 44.1 mL/min (by C-G formula based on SCr of 0.86 mg/dL). Liver Function Tests:  Recent Labs Lab 06/08/17 2133 06/09/17 0704  AST 31 30  ALT 16 18  ALKPHOS 86 79  BILITOT 1.4* 0.9  PROT 6.6 5.9*  ALBUMIN 3.1* 2.7*   No results for input(s): LIPASE, AMYLASE in the last  168 hours. No results for input(s): AMMONIA in the last 168 hours. Coagulation Profile: No results for input(s): INR, PROTIME in the last 168 hours. Cardiac Enzymes: No results for input(s): CKTOTAL, CKMB, CKMBINDEX, TROPONINI in the last 168 hours. BNP (last 3 results) No results for input(s): PROBNP in the last 8760 hours. HbA1C: No results for input(s): HGBA1C in the last 72 hours. CBG:  Recent Labs Lab 06/08/17 2131  GLUCAP 113*   Lipid Profile: No results for input(s): CHOL, HDL, LDLCALC, TRIG, CHOLHDL, LDLDIRECT in the last 72 hours. Thyroid Function Tests: No results for input(s): TSH, T4TOTAL, FREET4, T3FREE, THYROIDAB in the last 72 hours. Anemia Panel: No results for input(s): VITAMINB12, FOLATE, FERRITIN, TIBC, IRON, RETICCTPCT in the last 72 hours. Sepsis Labs: No results for input(s): PROCALCITON, LATICACIDVEN in the last 168 hours.  Recent Results (from the past 240 hour(s))  Culture, Urine     Status: Abnormal   Collection Time: 06/08/17 11:03 PM  Result  Value Ref Range Status   Specimen Description URINE, CLEAN CATCH  Final   Special Requests NONE  Final   Culture >=100,000 COLONIES/mL KLEBSIELLA OXYTOCA (A)  Final   Report Status 06/12/2017 FINAL  Final   Organism ID, Bacteria KLEBSIELLA OXYTOCA (A)  Final      Susceptibility   Klebsiella oxytoca - MIC*    AMPICILLIN >=32 RESISTANT Resistant     CEFAZOLIN >=64 RESISTANT Resistant     CEFTRIAXONE 4 SENSITIVE Sensitive     CIPROFLOXACIN <=0.25 SENSITIVE Sensitive     GENTAMICIN <=1 SENSITIVE Sensitive     IMIPENEM <=0.25 SENSITIVE Sensitive     NITROFURANTOIN 32 SENSITIVE Sensitive     TRIMETH/SULFA <=20 SENSITIVE Sensitive     AMPICILLIN/SULBACTAM >=32 RESISTANT Resistant     PIP/TAZO >=128 RESISTANT Resistant     Extended ESBL NEGATIVE Sensitive     * >=100,000 COLONIES/mL KLEBSIELLA OXYTOCA  MRSA PCR Screening     Status: None   Collection Time: 06/09/17  3:56 AM  Result Value Ref Range Status   MRSA by PCR NEGATIVE NEGATIVE Final    Comment:        The GeneXpert MRSA Assay (FDA approved for NASAL specimens only), is one component of a comprehensive MRSA colonization surveillance program. It is not intended to diagnose MRSA infection nor to guide or monitor treatment for MRSA infections.    Radiology Studies: No results found.  Scheduled Meds: . aspirin  81 mg Oral Daily  . chlorhexidine  15 mL Mouth Rinse BID  . guaiFENesin  1,200 mg Oral BID  . levothyroxine  125 mcg Oral QAC breakfast  . mouth rinse  15 mL Mouth Rinse q12n4p  . metoprolol tartrate  5 mg Intravenous Q6H  . polyethylene glycol  17 g Oral QODAY  . polyvinyl alcohol  1 drop Both Eyes QID  . simvastatin  40 mg Oral Daily  . Vitamin D (Ergocalciferol)  50,000 Units Oral Q30 days   Continuous Infusions: . sodium chloride 10 mL/hr at 06/09/17 0350  . dextrose 45 mL/hr at 06/14/17 1545    LOS: 6 days   Time spent: 21 mins  Irwin Brakeman, MD [facility] Pager [phone]  If  7PM-7AM, please contact night-coverage [email or web address] Password [email or web address] 06/15/2017, 9:04 AM

## 2017-06-15 NOTE — Progress Notes (Signed)
Patient repositioned, and mouth care attempted.  Patient refused mouth care by locking down and verbally spoke to me to stop it.  She did not want her mouth swabbed.

## 2017-06-15 NOTE — Progress Notes (Signed)
Received report on this patient today at 1515 and assumed care.

## 2017-06-16 MED ORDER — METOPROLOL SUCCINATE ER 25 MG PO TB24
25.0000 mg | ORAL_TABLET | Freq: Every day | ORAL | Status: DC
  Administered 2017-06-16: 25 mg via ORAL
  Filled 2017-06-16: qty 1

## 2017-06-16 NOTE — Progress Notes (Signed)
Loss of IV access, paged MD to see if PICC was needed.  No need for PICC at this time, as patient will most likely be palliative within the next 24 hours.  IV fluids and IV meds discontinued per MD.

## 2017-06-16 NOTE — Progress Notes (Signed)
RN asking to put PICC line in patient.  Given her current situation and family leaning to hospice care and declining PEG and making patient DNR I don't think it is appropriate to place a PICC line in this 83 y/o patient.  Will continue to encourage p.o. Will DC IVF order and IV metoprolol order.  Awaiting family to make final decision about disposition either to residential hospice or return to SNF.  I'm planning to discharge patient tomorrow.   Murvin Natal MD

## 2017-06-16 NOTE — Progress Notes (Signed)
PROGRESS NOTE   Allison Collier  UJW:[id number] DOB: [birth year] DOA: 06/08/2017 PCP: Sharion Balloon, FNP   Brief Narrative:  83 year old female with a history of systolic CHF, hypertension and hypothyroidism, was brought to the hospital with change in mental status.  There was concern that she may have developed a UTI and was started on Rocephin.  She was also noted to be short of breath and was hypoxic.  Chest x-ray indicated possible pulmonary vascular congestion.  She is admitted to the hospital.  It was noted that she was having difficulty swallowing and her shortness of breath may in fact be related to aspiration.  Speech therapy is evaluating.  Assessment & Plan:   Active Problems:   HTN (hypertension)   Hyperlipidemia   Hypothyroid   Lower urinary tract infectious disease   Chronic systolic CHF (congestive heart failure) (HCC)   Acute respiratory failure with hypoxia (HCC)   Thrombocytopenia (HCC)   Encephalopathy   Palliative care encounter   Goals of care, counseling/discussion   DNR (do not resuscitate) discussion   Encounter for hospice care discussion   1. Acute respiratory failure with hypoxia.  Initially thought to be due to decompensated CHF but likely secondary to aspiration.  Weaning down oxygen as tolerated.  Overall shortness of breath seems to have improved.  2. Chronic systolic congestive heart failure.  She received 1 dose of Lasix in the emergency room.  Upon evaluation, she does not appear to be particularly volume overloaded.  Chest x-ray does not appear to be markedly different compared to prior films.  She does not have any pedal edema.  We will hold off on further diuresis.  Ejection fraction is 30% on echocardiogram. 3. Dysphagia with high risk for aspiration.  Patient performed poorly during MBS.  I spoke with SLP. They have recommended palliative medicine consult.  Family met with palliative medicine and decided against PEG and making a decision about  hospice placement.    4. Encephalopathy related to hypoxia and UTI.  Pt has underlying dementia and has been sundowning nightly.  She is arousable but lethargic during the day likely because she is not sleeping at night.  Family at bedside is helping a lot during the day.   5. Urinary retention - foley placed 10/27.  6. Hypertension.  Blood pressures currently stable 7. Afib with RVR - resolved now.  Likely exacerbated by agitation and not having been given her oral metoprolol and started IV for now. Follow.  8. Hyperlipidemia.  Continue on statin 9. Hypothyroidism.  Continue on Synthroid 10. Thrombocytopenia.  Baseline labs are not available, since last lab values in our system from 2014.  At that time, she had mild thrombocytopenia with a platelet count of 125.  She does not have any evidence of bleeding at this time we will continue to monitor. SCDs for DVT prophylaxis.  11. Urinary tract infection.  Klebsiella oxytoca sensitive to Rocephin and macrobid and cipro.  Treated with IV ceftriaxone.   12. Chronic kidney disease stage II-III.  Creatinine appears to be near baseline.  Continue to monitor. 13. Hypernatremia.  Resolved now, suspect related to decreased p.o. intake and volume depletion.  Started on hypotonic fluids.  Improving, following. Continue IVF as p.o. Intake remains poor.    DVT prophylaxis: SCDs Code Status: DNR Family Communication: Discussed with family at the bedside Disposition Plan: likely hospice home placement tomorrow   Consultants: SLP    Procedures:  Echo:- Left ventricle: The cavity size was normal.  Wall thickness was   normal. Systolic function was severely reduced. The estimated  ejection fraction was 30%. Diffuse hypokinesis. Doppler  parameters are consistent with abnormal left ventricular  relaxation (grade 1 diastolic dysfunction). Doppler parameters  are consistent with high ventricular filling pressure. - Regional wall motion abnormality: Akinesis of the  basal-mid  inferoseptal myocardium; severe hypokinesis of the apical septal  myocardium; hypokinesis of the mid anterior and mid anteroseptal myocardium; moderate hypokinesis of the apical myocardium; mild hypokinesis of the apical lateral myocardium. - Aortic valve: Mildly to moderately calcified annulus. Trileaflet; mildly thickened leaflets. There was severe regurgitation. - Aorta: Mild ascending aortic dilatation. Diameter 3.9 cm. - Mitral valve: There was moderate regurgitation.  - Tricuspid valve: There was mild regurgitation.  Antimicrobials:  Rocephin 10/21 >10/25  Subjective: She was awake this morning and denies complaints.     Objective: Vitals:   06/15/17 1400 06/15/17 1839 06/15/17 2240 06/16/17 0520  BP: (!) 146/61 130/89 (!) 146/61 (!) 146/64  Pulse: 73  84 84  Resp: _0 Temp: 98 F (36.7 C)  97.9 F (36.6 C) (!) 97.5 F (36.4 C)  TempSrc: Oral  Oral Oral  SpO2: 97%  97% 98%  Weight:      Height:        Intake/Output Summary (Last 24 hours) at 06/16/17 0904 Last data filed at 06/16/17 0600  Gross per 24 hour  Intake              300 ml  Output              500 ml  Net             -200 ml   Filed Weights   06/09/17 0328  Weight: 69.8 kg (153 lb 14.1 oz)   Examination:  General exam: arousable, mouth breathing, denies complaints, NAD. Appears comfortable.   Respiratory system: Coarse breath sounds at bases. Respiratory effort normal. Cardiovascular system: S1 & S2 heard, RRR. No JVD, murmurs, rubs, gallops or clicks. No pedal edema. Gastrointestinal system: Abdomen is nondistended, soft and nontender. No organomegaly or masses felt. Normal bowel sounds heard. Central nervous system:  No focal neurological deficits. Extremities: Symmetric 5 x 5 power. Skin: No rashes, lesions or ulcers Psychiatry: demented  Data Reviewed: I have personally reviewed following labs and imaging studies  CBC:  Recent Labs Lab 06/10/17 0956 06/11/17 0943  06/12/17 0418  WBC 3.9* 4.0 4.2  HGB 11.2* 11.9* 12.1  HCT 35.2* 37.8 38.4  MCV 91.4 91.5 92.1  PLT 77* 82* 88*   Basic Metabolic Panel:  Recent Labs Lab 06/10/17 0956 06/11/17 0943 06/12/17 0418 06/13/17 0531 06/15/17 0812  NA 148* 152* 147* 144 140  K 3.9 4.1 4.2 3.8 3.9  CL 114* 117* 113* 111 106  CO2 _1 GLUCOSE 119* 111* 128* 140* 102*  BUN 36* 34* 31* 30* 23*  CREATININE 0.91 0.85 0.73 0.86 0.78  CALCIUM 8.7* 9.1 8.9 8.5* 8.6*   GFR: Estimated Creatinine Clearance: 47.4 mL/min (by C-G formula based on SCr of 0.78 mg/dL). Liver Function Tests: No results for input(s): AST, ALT, ALKPHOS, BILITOT, PROT, ALBUMIN in the last 168 hours. No results for input(s): LIPASE, AMYLASE in the last 168 hours. No results for input(s): AMMONIA in the last 168 hours. Coagulation Profile: No results for input(s): INR, PROTIME in the last 168 hours. Cardiac Enzymes: No results for input(s): CKTOTAL, CKMB, CKMBINDEX, TROPONINI in the last 168 hours.  BNP (last 3 results) No results for input(s): PROBNP in the last 8760 hours. HbA1C: No results for input(s): HGBA1C in the last 72 hours. CBG: No results for input(s): GLUCAP in the last 168 hours. Lipid Profile: No results for input(s): CHOL, HDL, LDLCALC, TRIG, CHOLHDL, LDLDIRECT in the last 72 hours. Thyroid Function Tests: No results for input(s): TSH, T4TOTAL, FREET4, T3FREE, THYROIDAB in the last 72 hours. Anemia Panel: No results for input(s): VITAMINB12, FOLATE, FERRITIN, TIBC, IRON, RETICCTPCT in the last 72 hours. Sepsis Labs: No results for input(s): PROCALCITON, LATICACIDVEN in the last 168 hours.  Recent Results (from the past 240 hour(s))  Culture, Urine     Status: Abnormal   Collection Time: 06/08/17 11:03 PM  Result Value Ref Range Status   Specimen Description URINE, CLEAN CATCH  Final   Special Requests NONE  Final   Culture >=100,000 COLONIES/mL KLEBSIELLA OXYTOCA (A)  Final   Report Status  06/12/2017 FINAL  Final   Organism ID, Bacteria KLEBSIELLA OXYTOCA (A)  Final      Susceptibility   Klebsiella oxytoca - MIC*    AMPICILLIN >=32 RESISTANT Resistant     CEFAZOLIN >=64 RESISTANT Resistant     CEFTRIAXONE 4 SENSITIVE Sensitive     CIPROFLOXACIN <=0.25 SENSITIVE Sensitive     GENTAMICIN <=1 SENSITIVE Sensitive     IMIPENEM <=0.25 SENSITIVE Sensitive     NITROFURANTOIN 32 SENSITIVE Sensitive     TRIMETH/SULFA <=20 SENSITIVE Sensitive     AMPICILLIN/SULBACTAM >=32 RESISTANT Resistant     PIP/TAZO >=128 RESISTANT Resistant     Extended ESBL NEGATIVE Sensitive     * >=100,000 COLONIES/mL KLEBSIELLA OXYTOCA  MRSA PCR Screening     Status: None   Collection Time: 06/09/17  3:56 AM  Result Value Ref Range Status   MRSA by PCR NEGATIVE NEGATIVE Final    Comment:        The GeneXpert MRSA Assay (FDA approved for NASAL specimens only), is one component of a comprehensive MRSA colonization surveillance program. It is not intended to diagnose MRSA infection nor to guide or monitor treatment for MRSA infections.    Radiology Studies: No results found.  Scheduled Meds: . aspirin  81 mg Oral Daily  . chlorhexidine  15 mL Mouth Rinse BID  . guaiFENesin  1,200 mg Oral BID  . levothyroxine  125 mcg Oral QAC breakfast  . mouth rinse  15 mL Mouth Rinse q12n4p  . metoprolol tartrate  5 mg Intravenous Q6H  . polyethylene glycol  17 g Oral QODAY  . polyvinyl alcohol  1 drop Both Eyes QID  . simvastatin  40 mg Oral Daily  . Vitamin D (Ergocalciferol)  50,000 Units Oral Q30 days   Continuous Infusions: . sodium chloride 10 mL/hr at 06/09/17 0350  . dextrose 45 mL/hr at 06/15/17 1251    LOS: 7 days   Time spent: 17 mins  Irwin Brakeman, MD [facility] Pager [phone]  If 7PM-7AM, please contact night-coverage [email or web address] Password [email or web address] 06/16/2017, 9:04 AM

## 2017-06-17 MED ORDER — LORAZEPAM 2 MG/ML IJ SOLN
0.5000 mg | Freq: Once | INTRAMUSCULAR | Status: AC
  Administered 2017-06-17: 0.5 mg via INTRAVENOUS
  Filled 2017-06-17: qty 1

## 2017-06-17 NOTE — Clinical Social Work Note (Signed)
Patient Information   Patient Name Allison Collier, Allison Collier ([id number]) Sex Female DOB June 30, 1921 SSN [id number]  Room Bed  A329 A329-01  Patient Demographics   Address [place] [address] Phone [phone] (Home)  Patient Ethnicity & Race   Ethnic Group Patient Race  Not Hispanic or Latino White or Caucasian  Emergency Contact(s)   Name Relation Home Work Mobile  Priddy,Joan Daughter [phone]  [phone]  Martie Round Daughter [phone]  [phone]  [place] Son [phone]  [phone]  [place],Laura Daughter [phone]    [place],Janice Daughter [phone]    [place],Judy Daughter [phone]    Documents on File    Status Date Received Description  Documents for the Patient  Driver's License Not Received    Outside Record Not Received    Insurance Card Not Received    [place] Received 08/29/11   [place] E-Signature HIPAA Notice of Privacy Received 10/08/11   [place] E-Signature HIPAA Notice of Privacy Spanish Not Received    Advance Directives/Living Will/HCPOA/POA Not Received    Financial Application Not Received    AMB Outside Consult Note Not Received  01/13 Ruthe Mannan MD   AMB Outside Consult Note Not Received  02/13 Ruthe Mannan  Insurance Card Received 03/06/12   Insurance Card Received 11/10/12 MCR/MCD/LS  Insurance Card Not Received    Advanced Beneficiary Notice (ABN) Not Received    [place] HIPAA NOTICE OF PRIVACY - Scanned Received 11/10/12 [facility]  Release of Information Received 11/10/12 [place] E-Signature HIPAA Notice of Privacy Signed 11/10/12 [facility]  Release of Information Received 11/10/12 [facility]  Insurance Card Received 12/03/12   Insurance Card Received 12/03/12 [facility]  HIM ROI Authorization Not Received  01/29/13 - Please send MAR (IV medications) from last hospital stay.  Release of Information Not Received    Insurance Card Not Received    Insurance  Card Not Received    Insurance Card Not Received    Insurance Card Received 04/16/13   AMB Correspondence Not Received  06/14 d/s Anastasio Champion MD, N  HIM ROI Authorization Not Received    Release of Information Not Received    AMB Correspondence  10/03/10 OFFICE VISIT [facility]  Other Photo ID Not Received    AMB Correspondence  06/07/16 MONTHLY NOTE [facility]  AMB HH/NH/Hospice  04/23/13 ADMISSION RECORD [facility]  HIM ROI Authorization  07/03/16 [place]  AMB HH/NH/Hospice  07/10/16 09/14-09/17 OFFICE NOTES [facility]  HIM ROI Authorization (Expired) 01/31/17 Authorization for batch CIOX/[employer] Medicare Risk Adjustment  fbg  02/10/75  Driver's License (Deleted) 28/31/51   Outside Record (Deleted) 01/23/10   Release of Information (Deleted) 05/07/13   AMB HH/NH/Hospice (Deleted) 06/13/16 ADMISSION RECORD [facility]  Documents for the Encounter  AOB (Assignment of Insurance Benefits) Received 06/08/17 unable to obtain  E-signature AOB     MEDICARE RIGHTS Received 06/08/17 unable to obtain  E-signature Medicare Rights     Cardiac Monitoring Strip Shift Summary Received 06/09/17   Cardiac Monitoring Strip Received 06/09/17   ED Patient Billing Extract   ED PB Summary  Study Attachment for Report   External Report  Admission Information   Attending Provider Admitting Provider Admission Type Admission Date/Time  Murlean Iba, MD Oswald Hillock, MD Emergency 06/08/17 2120  Discharge Date Hospital Service Auth/Cert Status Service Area   Internal Medicine Incomplete [place]  [place]  Unit Room/Bed Admission Status   AP-DEPT 300 A329/A329-01 Admission (Confirmed)   Admission   Complaint  [facility] Account   Name Acct ID Class Status Primary Coverage  Aubrei, Bouchie [id number] [place]      Guarantor  Account (for Hospital Account [id number])   Name Relation to Pt Service Area Active? Acct Type  Sandi Mealy Self CHSA Yes Personal/Family  Address Phone    [place], [place] [address] [phone])        Coverage Information (for Hospital Account [id number])   F/O Payor/Plan Precert #  [facility] #  Aishani, Kalis [id number]  [place] [address] [phone]   Patient Information   Patient Name Allison Collier, Allison Collier ([id number]) Sex Female DOB [birth year]  Room Bed  A329 A329-01  Patient Demographics   Address [place] [address] Phone [phone] (Home)  Patient Ethnicity & Race   Ethnic Group Patient Race  Not Hispanic or Latino White or Caucasian  Emergency Contact(s)   Name Relation Home Work Mobile  Priddy,Joan Daughter [phone]  [phone]  Martie Round Daughter [phone]  [phone]  [address] [place] [address] Son [phone]  [phone]  [place],Laura Daughter [phone]    [place],Janice Daughter [phone]    [place],Judy Daughter [phone]    Documents on File    Status Date Received Description  Documents for the Patient  Driver's License Not Received    Outside Record Not Received    Insurance Card Not Received    [place] Received 08/29/11   [place] E-Signature HIPAA Notice of Privacy Received 10/08/11   [place] E-Signature HIPAA Notice of Privacy Spanish Not Received    Advance Directives/Living Will/HCPOA/POA Not Received    Financial Application Not Received    AMB Outside Consult Note Not Received  01/13 Ruthe Mannan MD   AMB Outside Consult Note Not Received  02/13 Ruthe Mannan  Insurance Card Received 03/06/12   Insurance Card Received 11/10/12 MCR/MCD/LS  Insurance Card Not Received    Advanced Beneficiary Notice (ABN) Not Received    [place] HIPAA NOTICE  OF PRIVACY - Scanned Received 11/10/12 [facility]  Release of Information Received 11/10/12 [place] E-Signature HIPAA Notice of Privacy Signed 11/10/12 [facility]  Release of Information Received 11/10/12 [facility]  Insurance Card Received 12/03/12   Insurance Card Received 12/03/12 [facility]  HIM ROI Authorization Not Received  01/29/13 - Please send MAR (IV medications) from last hospital stay.  Release of Information Not Received    Insurance Card Not Received    Insurance Card Not Received    Insurance Card Not Received    Insurance Card Received 04/16/13   AMB Correspondence Not Received  06/14 d/s Anastasio Champion MD, N  HIM ROI Authorization Not Received    Release of Information Not Received    AMB Correspondence  10/03/10 OFFICE VISIT [facility]  Other Photo ID Not Received    AMB Correspondence  06/07/16 MONTHLY NOTE [facility]  AMB HH/NH/Hospice  04/23/13 ADMISSION RECORD [facility]  HIM ROI Authorization  07/03/16 [place]  AMB HH/NH/Hospice  07/10/16 09/14-09/17 OFFICE NOTES [facility]  HIM ROI Authorization (Expired) 01/31/17 Authorization for batch CIOX/[employer]Health[employer] Medicare Risk Adjustment  fbg  2/63/33  Driver's License (Deleted) 54/56/25  Outside Record (Deleted) 01/23/10   Release of Information (Deleted) 05/07/13   AMB HH/NH/Hospice (Deleted) 06/13/16 ADMISSION RECORD [facility]  Documents for the Encounter  AOB (Assignment of Insurance Benefits) Received 06/08/17 unable to obtain  E-signature AOB     MEDICARE RIGHTS Received 06/08/17 unable to obtain  E-signature Medicare Rights     Cardiac Monitoring Strip Shift Summary Received 06/09/17   Cardiac Monitoring Strip Received 06/09/17   ED Patient Billing Extract   ED PB Summary  Study Attachment for Report   External Report  Admission Information   Attending Provider Admitting Provider Admission Type Admission Date/Time   Murlean Iba, MD Oswald Hillock, MD Emergency 06/08/17 2120  Discharge Date Hospital Service Auth/Cert Status Service Area   Internal Medicine Incomplete [place]  Unit Room/Bed Admission Status   AP-DEPT 300 A329/A329-01 Admission (Confirmed)   Admission   Complaint  [facility] Account   Name Acct ID Class Status Primary Coverage  [place], Sieben [id number] Inpatient [place]      Guarantor Account (for [place] [id number])   Name Relation to Pt Service Area Active? Acct Type  Sandi Mealy Self [facility] Yes Personal/Family  Address Phone    [place], [place] [address] [phone])        Coverage Information (for Hospital Account [id number])   F/O Payor/Plan Precert #  [facility] [place] #  Jeanetta, Alonzo [id number]  Address [place] [address] [phone]

## 2017-06-17 NOTE — Progress Notes (Signed)
NURSING PROGRESS NOTE  Allison Collier [id number] Discharge Data: 06/17/2017 4:36 PM Attending Provider: Murlean Iba, MD [id number], Theador Hawthorne, FNP     Sandi Mealy to be D/C'd [facility] per MD order.   All IV's discontinued with no bleeding noted. Report called to Elane Fritz, Therapist, sports at [facility].   Last Vital Signs:  Blood pressure 129/66, pulse 88, temperature 98 F (36.7 C), temperature source Oral, resp. rate 16, height 6' (1.829 m), weight 69.8 kg (153 lb 14.1 oz), SpO2 97 %.  Discharge Medication List Allergies as of 06/17/2017      Reactions   Sulfa Antibiotics    Reaction unknown      Medication List    STOP taking these medications   aspirin 81 MG chewable tablet   Cholecalciferol 50000 units Tabs   furosemide 20 MG tablet Commonly known as:  LASIX   levothyroxine 125 MCG tablet Commonly known as:  SYNTHROID, LEVOTHROID   Melatonin 3 MG Caps   Menthol (Topical Analgesic) 5 % Gel   multivitamin-lutein Caps capsule   nitroGLYCERIN 0.4 MG SL tablet Commonly known as:  NITROSTAT   polyethylene glycol packet Commonly known as:  MIRALAX / GLYCOLAX   senna 8.6 MG tablet Commonly known as:  SENOKOT     TAKE these medications   metoprolol succinate 25 MG 24 hr tablet Commonly known as:  TOPROL XL Take 1 tablet (25 mg total) by mouth at bedtime.   SYSTANE 0.4-0.3 % Gel ophthalmic gel Generic drug:  Polyethyl Glycol-Propyl Glycol Place 1 application into both eyes 4 (four) times daily.

## 2017-06-17 NOTE — Discharge Summary (Signed)
Physician Discharge Summary  ETTA GASSETT YSA:[id number] DOB: January 24, 1921 DOA: 06/08/2017  PCP: Sharion Balloon, FNP  Admit date: 06/08/2017 Discharge date: 06/17/2017  Admitted From: SNF Disposition: Hospice Home  Discharge Condition: Hospice   CODE STATUS: DNR    Brief Hospitalization Summary: Please see all hospital notes, images, labs for full details of the hospitalization.  HPI:   Auri Jahnke  is a 83 y.o. female, with history of hypertension, hypothyroidism, hyperlipidemia, chronic systolic CHF, CAD was brought to the hospital from [facility] for altered mental status.  No other history obtainable.  Apparently patient fell yesterday at [facility] clinic. In the ED she was found to have UTI and started on ceftriaxone.  Also given 2 L of IV normal saline. Chest x-ray showed increased pulmonary vascular congestion  At this time patient is in respiratory distress, with increased secretions. She is unable to provide any history  Brief Narrative:  83 year old female with a history of systolic CHF, hypertension and hypothyroidism, was brought to the hospital with change in mental status.  There was concern that she may have developed a UTI and was started on Rocephin.  She was also noted to be short of breath and was hypoxic.  Chest x-ray indicated possible pulmonary vascular congestion.  She is admitted to the hospital.  It was noted that she was having difficulty swallowing and her shortness of breath may in fact be related to aspiration.  Speech therapy is evaluating.  Assessment & Plan:   Active Problems:   HTN (hypertension)   Hyperlipidemia   Hypothyroid   Lower urinary tract infectious disease   Chronic systolic CHF (congestive heart failure) (HCC)   Acute respiratory failure with hypoxia (HCC)   Thrombocytopenia (HCC)   Encephalopathy   Palliative care encounter   Goals of care, counseling/discussion   DNR (do not resuscitate) discussion   Encounter for  hospice care discussion   1. Acute respiratory failure with hypoxia.  Initially thought to be due to decompensated CHF but likely secondary to aspiration.  Weaning down oxygen as tolerated.  Overall shortness of breath seems to have improved.  2. Chronic systolic congestive heart failure.  She received 1 dose of Lasix in the emergency room.  Upon evaluation, she does not appear to be particularly volume overloaded.  Chest x-ray does not appear to be markedly different compared to prior films.  She does not have any pedal edema.  We will hold off on further diuresis.  Ejection fraction is 30% on echocardiogram. 3. Dysphagia with high risk for aspiration.  Patient performed poorly during MBS.  I spoke with SLP. They have recommended palliative medicine consult.  Family met with palliative medicine and decided against PEG and favored hospice placement.  They requested hospice home placement.   4. Encephalopathy related to hypoxia and UTI.  Pt has underlying dementia and has been sundowning nightly.  She is arousable but lethargic during the day likely because she is not sleeping at night.  Family at bedside is helping a lot during the day.  She received lorazepam at night but sleepy during most of day. 5. Urinary retention - foley placed 10/27 will remain in place for comfort.   6. Hypertension.  Blood pressures currently stable 7. Afib with RVR - resolved now. Controlled with oral metoprolol.   8. Hyperlipidemia.  Continue on statin 9. Hypothyroidism.  Continue on Synthroid 10. Urinary tract infection.  Klebsiella oxytoca sensitive to Rocephin and macrobid and cipro.  Treated with IV ceftriaxone.  11. Chronic kidney disease stage II-III.  Creatinine appears to be near baseline. Improved with IVF hydration.  12. Hypernatremia.  Resolved now, suspect related to decreased p.o. intake and volume depletion.  Started on hypotonic fluids.  Improved with IVF hydration.   DVT prophylaxis: SCDs Code  Status: DNR Family Communication: Discussed with family at the bedside Disposition Plan: family requested hospice home placement   Discharge Diagnoses:  Active Problems:   HTN (hypertension)   Hyperlipidemia   Hypothyroid   Lower urinary tract infectious disease   Chronic systolic CHF (congestive heart failure) (HCC)   Acute respiratory failure with hypoxia (HCC)   Thrombocytopenia (HCC)   Encephalopathy   Palliative care encounter   Goals of care, counseling/discussion   DNR (do not resuscitate) discussion   Encounter for hospice care discussion  Discharge Instructions:  Allergies as of 06/17/2017      Reactions   Sulfa Antibiotics    Reaction unknown      Medication List    STOP taking these medications   aspirin 81 MG chewable tablet   Cholecalciferol 50000 units Tabs   furosemide 20 MG tablet Commonly known as:  LASIX   levothyroxine 125 MCG tablet Commonly known as:  SYNTHROID, LEVOTHROID   Melatonin 3 MG Caps   Menthol (Topical Analgesic) 5 % Gel   multivitamin-lutein Caps capsule   nitroGLYCERIN 0.4 MG SL tablet Commonly known as:  NITROSTAT   polyethylene glycol packet Commonly known as:  MIRALAX / GLYCOLAX   senna 8.6 MG tablet Commonly known as:  SENOKOT     TAKE these medications   metoprolol succinate 25 MG 24 hr tablet Commonly known as:  TOPROL XL Take 1 tablet (25 mg total) by mouth at bedtime.   SYSTANE 0.4-0.3 % Gel ophthalmic gel Generic drug:  Polyethyl Glycol-Propyl Glycol Place 1 application into both eyes 4 (four) times daily.      Contact information for after-discharge care    Destination    [facility] Follow up.   Specialty:  [place] information: [place] [phone]             Allergies  Allergen Reactions  . Sulfa Antibiotics     Reaction unknown   Current Discharge Medication List    CONTINUE these medications which have NOT  CHANGED   Details  metoprolol succinate (TOPROL XL) 25 MG 24 hr tablet Take 1 tablet (25 mg total) by mouth at bedtime. Qty: 90 tablet, Refills: 3    Polyethyl Glycol-Propyl Glycol (SYSTANE) 0.4-0.3 % GEL ophthalmic gel Place 1 application into both eyes 4 (four) times daily.      STOP taking these medications     aspirin 81 MG chewable tablet      Cholecalciferol 50000 units TABS      furosemide (LASIX) 20 MG tablet      levothyroxine (SYNTHROID, LEVOTHROID) 125 MCG tablet      Melatonin 3 MG CAPS      Menthol, Topical Analgesic, 5 % GEL      multivitamin-lutein (OCUVITE-LUTEIN) CAPS capsule      nitroGLYCERIN (NITROSTAT) 0.4 MG SL tablet      polyethylene glycol (MIRALAX / GLYCOLAX) packet      senna (SENOKOT) 8.6 MG tablet      acetaminophen (TYLENOL) 650 MG suppository      simvastatin (ZOCOR) 40 MG tablet         Procedures/Studies: Dg Chest 1 View  Result Date:  06/08/2017 CLINICAL DATA:  Tachypnea and hypoxia with altered level level of consciousness. EXAM: CHEST 1 VIEW COMPARISON:  04/19/2013 FINDINGS: AP upright view of the chest demonstrates cardiomegaly with aortic arteriosclerosis. Blunting of the right costophrenic angle appears chronic and may reflect pleural thickening versus a small right effusion. There is central pulmonary vascular congestion and redistribution consistent with mild CHF. No acute nor suspicious osseous abnormality. IMPRESSION: 1. Cardiomegaly with aortic atherosclerosis. 2. Mild central vascular congestion consistent with mild CHF. 3. Blunting of the right costophrenic angle may reflect pleural thickening or small right effusion. Electronically Signed   By: Ashley Royalty M.D.   On: 06/08/2017 23:05   Ct Head Wo Contrast  Result Date: 06/08/2017 CLINICAL DATA:  Initial evaluation for acute altered mental status. Recent fall. EXAM: CT HEAD WITHOUT CONTRAST TECHNIQUE: Contiguous axial images were obtained from the base of the skull through the  vertex without intravenous contrast. COMPARISON:  Prior CT from 01/14/2013. FINDINGS: Brain: Generalized age related cerebral atrophy. Moderate chronic small vessel ischemic disease. Remote lacunar infarct present within the right thalamus. Small remote left cerebellar infarct. No acute intracranial hemorrhage. No evidence for acute large vessel territory infarct. No mass lesion, midline shift or mass effect. No hydrocephalus. No extra-axial fluid collection. Vascular: No hyperdense vessel. Scattered vascular calcifications noted within the carotid siphons. Skull: Scalp soft tissues and calvarium within normal limits. Sinuses/Orbits: Globes and orbital soft tissues normal. Patient status post lens extraction bilaterally. Visualized paranasal sinuses are clear. No mastoid effusion. Other: None. IMPRESSION: 1. No acute intracranial abnormality. 2. Remote right thalamic lacunar infarct with additional small remote left cerebellar infarct. 3. Generalized age related cerebral atrophy with moderate chronic small vessel ischemic disease. Electronically Signed   By: Jeannine Boga M.D.   On: 06/08/2017 23:49   Dg Swallowing Func-speech Pathology  Result Date: 06/13/2017 Objective Swallowing Evaluation: Type of Study: MBS-Modified Barium Swallow Study Patient Details Name: CHARLISA CHAM MRN: [id number] Date of Birth: 04-Mar-1921 Today's Date: 06/12/2017 Time: SLP Start Time (ACUTE ONLY): 1045-SLP Stop Time (ACUTE ONLY): 1145 SLP Time Calculation (min) (ACUTE ONLY): 60 min Past Medical History: Past Medical History: Diagnosis Date . Anemia  . Arthritis  . CAD (coronary artery disease)   Distant MI . Cancer (HCC)   Esophageal . Cardiomyopathy ([place])   EF . CKD (chronic kidney disease) stage 2, GFR 60-89 ml/min  . Dementia  . GERD (gastroesophageal reflux disease)  . Hyperlipidemia  . Hypertension  . Hypothyroidism  . Osteoporosis  . Pleural effusion  . Thyroid disease  Past Surgical History: Past Surgical History:  Procedure Laterality Date . CORONARY ANGIOPLASTY WITH STENT PLACEMENT   . ESOPHAGOGASTRODUODENOSCOPY Left 04/21/2013  Procedure: ESOPHAGOGASTRODUODENOSCOPY (EGD);  Surgeon: Daneil Dolin, MD;  Location: [facility];  Service: Endoscopy;  Laterality: Left; . ESOPHAGOGASTRODUODENOSCOPY (EGD) WITH ESOPHAGEAL DILATION N/A 05/13/2013  Procedure: ESOPHAGOGASTRODUODENOSCOPY (EGD) WITH ESOPHAGEAL DILATION;  Surgeon: Daneil Dolin, MD;  Location: [facility];  Service: Endoscopy;  Laterality: N/A;  8:45-rescheduled to 12:30 Darius Bump to notify pt . Esophagogatroduodenoscopy    multiple dilatations . EYE SURGERY   . ORIF HIP FRACTURE  08/31/2011  Procedure: OPEN REDUCTION INTERNAL FIXATION HIP;  Surgeon: Arther Abbott, MD;  Location: [facility] ORS;  Service: Orthopedics;  Laterality: Left;  ORIF/Gamma Nail Left Hip HPI: MargaretHicksis a 83 y.o.female,with history of hypertension, hypothyroidism, hyperlipidemia, chronic systolic CHF, CAD was brought to the hospital from [facility] for altered mental status. No other history obtainable. Apparently patient fell yesterday at  [facility] clinic. In the ED she was found to have UTI and started on ceftriaxone. Also given 2 L of IV normal saline.Chest x-ray showed increased pulmonary vascular congestion. At this time patient is in respiratory distress, with increased secretions. Family reports Pt with PMH significant for esophageal cancer treated with chemo/rad years ago and need for esophageal dilation in the past with Dr. Gala Romney. BSE ordered. Head CT shows: Remote right thalamic lacunar infarct with additional small remote left cerebellar infarct. Subjective: "Oh" Assessment / Plan / Recommendation CHL IP CLINICAL IMPRESSIONS 06/12/2017 Clinical Impression Functionally moderate/severe oral phase dysphagia and moderate pharyngeal phase dysphagia characterized by weak oral sling muscles (decreased labial closure, inefficient lingual movement) resulting in anterior labial spillage  with liquids, decreased anterior posterior transit, lingual pumping, oral holding, and premature spillage. Pharyngeal phase is characterized by delay in swallow initiation, reduced tongue base retraction, epiglottic deflection, and reduced pharyngeal pressure resulting in mod/severe vallecular residue after the swallow. Pt with deep penetration of NTL before the swallow which was sensed with a cough. Although gross aspiration was not observed during imaging, Pt is at high risk for aspiration due to pharyngeal weakness and resulting residuals. Esophageal sweep revealed barium stasis throughout the esophageal column with some retrograde movement indicated a component of esophageal phase dysphagia in addition to oropharyngeal dysphagia. Pt reportedly has a history of esophageal CA treated with radiation and has had dilation in the past to disrupt scar tissue per family. Pt had  PEG in the past in order to get through CA treatment. Pt's poor cognitive status and decreased alertness negatively impact safe swallow at this time in addition to weakness. This assessment and imaging was reviewed with Pt's two daughters. SLP indicated that Pt is at high risk for aspiration, dehydration, and malnutrition. SLP suggested that family speak with MD and palliative care team to help establish goals of care. Family indicated that "she is 83 years old" and likely would not be interested in feeding tubes and hope to offer oral diet if possible. SLP provided further education regarding negative implications of feeding tubes in patient's with dementia. SLP recommended "safest diet" with understanding of risk of aspiration of D1/puree with honey-thick liquids by teaspoon when Pt is alert and upright. Above reviewed with MD and RN. Will request palliative care consult. PT consult may be beneficial given that Pt appears deconditioned and was reportedly able to ambulate to bathroom with a walker PTA. Head CT showed Remote right thalamic  lacunar infarct with additional small remote left cerebellar infarct. Pt presenting with seemingly right upper extremity weakness, although difficult to determine if this is "new" or not. SLP will follow per goals of care. SLP Visit Diagnosis Dysphagia, oropharyngeal phase (R13.12) Attention and concentration deficit following -- Frontal lobe and executive function deficit following -- Impact on safety and function Risk for inadequate nutrition/hydration;Moderate aspiration risk;Severe aspiration risk   CHL IP TREATMENT RECOMMENDATION 06/12/2017 Treatment Recommendations Therapy as outlined in treatment plan below   Prognosis 06/12/2017 Prognosis for Safe Diet Advancement Guarded Barriers to Reach Goals Cognitive deficits;Severity of deficits;Medication Barriers/Prognosis Comment posture in bed CHL IP DIET RECOMMENDATION 06/12/2017 SLP Diet Recommendations Dysphagia 1 (Puree) solids;Honey thick liquids;Ice chips PRN after oral care Liquid Administration via [place];No straw Medication Administration Crushed with puree Compensations Slow rate;Small sips/bites Postural Changes Remain semi-upright after after feeds/meals (Comment);Seated upright at 90 degrees   CHL IP OTHER RECOMMENDATIONS 06/12/2017 Recommended Consults (No Data) Oral Care Recommendations Oral care before and after PO;Staff/trained caregiver to provide oral  care;Oral care QID Other Recommendations Order thickener from pharmacy;Clarify dietary restrictions   CHL IP FOLLOW UP RECOMMENDATIONS 06/12/2017 Follow up Recommendations 24 hour supervision/assistance;Home health SLP   CHL IP FREQUENCY AND DURATION 06/12/2017 Speech Therapy Frequency (ACUTE ONLY) min 2x/week Treatment Duration 1 week      CHL IP ORAL PHASE 06/12/2017 Oral Phase Impaired Oral - Pudding Teaspoon -- Oral - Pudding Cup -- Oral - Honey Teaspoon Weak lingual manipulation;Lingual pumping;Incomplete tongue to palate contact;Reduced posterior propulsion;Lingual/palatal residue;Delayed oral  transit;Premature spillage Oral - Honey Cup -- Oral - Nectar Teaspoon Weak lingual manipulation;Lingual pumping;Incomplete tongue to palate contact;Reduced posterior propulsion;Lingual/palatal residue;Delayed oral transit;Premature spillage Oral - Nectar Cup Weak lingual manipulation;Lingual pumping;Incomplete tongue to palate contact;Reduced posterior propulsion;Lingual/palatal residue;Delayed oral transit;Premature spillage Oral - Nectar Straw -- Oral - Thin Teaspoon Weak lingual manipulation;Lingual pumping;Incomplete tongue to palate contact;Reduced posterior propulsion;Lingual/palatal residue;Delayed oral transit;Premature spillage;Right anterior bolus loss Oral - Thin Cup -- Oral - Thin Straw -- Oral - Puree Weak lingual manipulation;Lingual pumping;Incomplete tongue to palate contact;Reduced posterior propulsion;Lingual/palatal residue;Piecemeal swallowing;Delayed oral transit;Decreased bolus cohesion Oral - Mech Soft -- Oral - Regular -- Oral - Multi-Consistency -- Oral - Pill -- Oral Phase - Comment --  CHL IP PHARYNGEAL PHASE 06/12/2017 Pharyngeal Phase Impaired Pharyngeal- Pudding Teaspoon -- Pharyngeal -- Pharyngeal- Pudding Cup -- Pharyngeal -- Pharyngeal- Honey Teaspoon Delayed swallow initiation-vallecula;Reduced pharyngeal peristalsis;Reduced epiglottic inversion;Reduced tongue base retraction;Pharyngeal residue - valleculae;Penetration/Aspiration during swallow Pharyngeal Material enters airway, remains ABOVE vocal cords then ejected out Pharyngeal- Honey Cup -- Pharyngeal -- Pharyngeal- Nectar Teaspoon Delayed swallow initiation-vallecula;Reduced epiglottic inversion;Reduced tongue base retraction;Penetration/Aspiration before swallow;Pharyngeal residue - valleculae Pharyngeal Material enters airway, CONTACTS cords and then ejected out Pharyngeal- Nectar Cup Delayed swallow initiation-vallecula;Reduced epiglottic inversion;Reduced pharyngeal peristalsis;Reduced tongue base retraction;Pharyngeal  residue - valleculae Pharyngeal -- Pharyngeal- Nectar Straw -- Pharyngeal -- Pharyngeal- Thin Teaspoon Delayed swallow initiation-pyriform sinuses;Reduced epiglottic inversion;Pharyngeal residue - valleculae;Pharyngeal residue - pyriform Pharyngeal -- Pharyngeal- Thin Cup -- Pharyngeal -- Pharyngeal- Thin Straw -- Pharyngeal -- Pharyngeal- Puree Delayed swallow initiation-vallecula;Reduced epiglottic inversion;Reduced pharyngeal peristalsis;Reduced tongue base retraction;Pharyngeal residue - valleculae Pharyngeal -- Pharyngeal- Mechanical Soft -- Pharyngeal -- Pharyngeal- Regular -- Pharyngeal -- Pharyngeal- Multi-consistency -- Pharyngeal -- Pharyngeal- Pill -- Pharyngeal -- Pharyngeal Comment --  CHL IP CERVICAL ESOPHAGEAL PHASE 06/12/2017 Cervical Esophageal Phase Impaired Pudding Teaspoon -- Pudding Cup -- Honey Teaspoon -- Honey Cup -- Nectar Teaspoon -- Nectar Cup -- Nectar Straw -- Thin Teaspoon Reduced cricopharyngeal relaxation;Prominent cricopharyngeal segment Thin Cup -- Thin Straw -- Puree -- Mechanical Soft -- Regular -- Multi-consistency -- Pill -- Cervical Esophageal Comment -- No flowsheet data found. Thank you, Genene Churn, [place] 06/12/2017, 3:10 PM                 Subjective: Pt appears comfortable.    Discharge Exam: Vitals:   06/17/17 0800 06/17/17 1157  BP: (!) 148/62   Pulse: 86   Resp: 15   Temp: 98.1 F (36.7 C)   SpO2: 96% 94%   Vitals:   06/16/17 1400 06/16/17 2104 06/17/17 0800 06/17/17 1157  BP: (!) 153/67 (!) 164/75 (!) 148/62   Pulse: 90 (!) 101 86   Resp: '16 16 15   '$ Temp: 97.8 F (36.6 C) (!) 97.4 F (36.3 C) 98.1 F (36.7 C)   TempSrc: Oral Axillary Axillary   SpO2: 96% 99% 96% 94%  Weight:      Height:       General exam: arousable, mouth breathing, denies complaints, NAD. Appears comfortable.  Respiratory system: Coarse breath sounds at bases. Respiratory effort normal. Cardiovascular system: S1 & S2 heard, RRR. No  JVD, murmurs, rubs, gallops or clicks. No pedal edema. Gastrointestinal system: Abdomen is nondistended, soft and nontender. No organomegaly or masses felt. Normal bowel sounds heard. Central nervous system:  No focal neurological deficits. Extremities: Symmetric 5 x 5 power. Skin: No rashes, lesions or ulcers Psychiatry: demented   The results of significant diagnostics from this hospitalization (including imaging, microbiology, ancillary and laboratory) are listed below for reference.     Microbiology: Recent Results (from the past 240 hour(s))  Culture, Urine     Status: Abnormal   Collection Time: 06/08/17 11:03 PM  Result Value Ref Range Status   Specimen Description URINE, CLEAN CATCH  Final   Special Requests NONE  Final   Culture >=100,000 COLONIES/mL KLEBSIELLA OXYTOCA (A)  Final   Report Status 06/12/2017 FINAL  Final   Organism ID, Bacteria KLEBSIELLA OXYTOCA (A)  Final      Susceptibility   Klebsiella oxytoca - MIC*    AMPICILLIN >=32 RESISTANT Resistant     CEFAZOLIN >=64 RESISTANT Resistant     CEFTRIAXONE 4 SENSITIVE Sensitive     CIPROFLOXACIN <=0.25 SENSITIVE Sensitive     GENTAMICIN <=1 SENSITIVE Sensitive     IMIPENEM <=0.25 SENSITIVE Sensitive     NITROFURANTOIN 32 SENSITIVE Sensitive     TRIMETH/SULFA <=20 SENSITIVE Sensitive     AMPICILLIN/SULBACTAM >=32 RESISTANT Resistant     PIP/TAZO >=128 RESISTANT Resistant     Extended ESBL NEGATIVE Sensitive     * >=100,000 COLONIES/mL KLEBSIELLA OXYTOCA  MRSA PCR Screening     Status: None   Collection Time: 06/09/17  3:56 AM  Result Value Ref Range Status   MRSA by PCR NEGATIVE NEGATIVE Final    Comment:        The GeneXpert MRSA Assay (FDA approved for NASAL specimens only), is one component of a comprehensive MRSA colonization surveillance program. It is not intended to diagnose MRSA infection nor to guide or monitor treatment for MRSA infections.      Labs: BNP (last 3 results)  Recent Labs   06/09/17 0705  BNP 3,546.5*   Basic Metabolic Panel:  Recent Labs Lab 06/11/17 0943 06/12/17 0418 06/13/17 0531 06/15/17 0812  NA 152* 147* 144 140  K 4.1 4.2 3.8 3.9  CL 117* 113* 111 106  CO2 '25 26 26 26  '$ GLUCOSE 111* 128* 140* 102*  BUN 34* 31* 30* 23*  CREATININE 0.85 0.73 0.86 0.78  CALCIUM 9.1 8.9 8.5* 8.6*   Liver Function Tests: No results for input(s): AST, ALT, ALKPHOS, BILITOT, PROT, ALBUMIN in the last 168 hours. No results for input(s): LIPASE, AMYLASE in the last 168 hours. No results for input(s): AMMONIA in the last 168 hours. CBC:  Recent Labs Lab 06/11/17 0943 06/12/17 0418  WBC 4.0 4.2  HGB 11.9* 12.1  HCT 37.8 38.4  MCV 91.5 92.1  PLT 82* 88*   Cardiac Enzymes: No results for input(s): CKTOTAL, CKMB, CKMBINDEX, TROPONINI in the last 168 hours. BNP: Invalid input(s): POCBNP CBG: No results for input(s): GLUCAP in the last 168 hours. D-Dimer No results for input(s): DDIMER in the last 72 hours. Hgb A1c No results for input(s): HGBA1C in the last 72 hours. Lipid Profile No results for input(s): CHOL, HDL, LDLCALC, TRIG, CHOLHDL, LDLDIRECT in the last 72 hours. Thyroid function studies No results for input(s): TSH, T4TOTAL, T3FREE, THYROIDAB in the last 72 hours.  Invalid input(s):  FREET3 Anemia work up No results for input(s): VITAMINB12, FOLATE, FERRITIN, TIBC, IRON, RETICCTPCT in the last 72 hours. Urinalysis    Component Value Date/Time   COLORURINE AMBER (A) 06/08/2017 2257   APPEARANCEUR HAZY (A) 06/08/2017 2257   LABSPEC 1.015 06/08/2017 2257   PHURINE 6.0 06/08/2017 2257   GLUCOSEU NEGATIVE 06/08/2017 2257   HGBUR LARGE (A) 06/08/2017 2257   BILIRUBINUR NEGATIVE 06/08/2017 2257   KETONESUR 5 (A) 06/08/2017 2257   PROTEINUR 30 (A) 06/08/2017 2257   UROBILINOGEN 0.2 04/19/2013 2221   NITRITE NEGATIVE 06/08/2017 2257   LEUKOCYTESUR LARGE (A) 06/08/2017 2257   Sepsis Labs Invalid input(s): PROCALCITONIN,  WBC,   LACTICIDVEN Microbiology Recent Results (from the past 240 hour(s))  Culture, Urine     Status: Abnormal   Collection Time: 06/08/17 11:03 PM  Result Value Ref Range Status   Specimen Description URINE, CLEAN CATCH  Final   Special Requests NONE  Final   Culture >=100,000 COLONIES/mL KLEBSIELLA OXYTOCA (A)  Final   Report Status 06/12/2017 FINAL  Final   Organism ID, Bacteria KLEBSIELLA OXYTOCA (A)  Final      Susceptibility   Klebsiella oxytoca - MIC*    AMPICILLIN >=32 RESISTANT Resistant     CEFAZOLIN >=64 RESISTANT Resistant     CEFTRIAXONE 4 SENSITIVE Sensitive     CIPROFLOXACIN <=0.25 SENSITIVE Sensitive     GENTAMICIN <=1 SENSITIVE Sensitive     IMIPENEM <=0.25 SENSITIVE Sensitive     NITROFURANTOIN 32 SENSITIVE Sensitive     TRIMETH/SULFA <=20 SENSITIVE Sensitive     AMPICILLIN/SULBACTAM >=32 RESISTANT Resistant     PIP/TAZO >=128 RESISTANT Resistant     Extended ESBL NEGATIVE Sensitive     * >=100,000 COLONIES/mL KLEBSIELLA OXYTOCA  MRSA PCR Screening     Status: None   Collection Time: 06/09/17  3:56 AM  Result Value Ref Range Status   MRSA by PCR NEGATIVE NEGATIVE Final    Comment:        The GeneXpert MRSA Assay (FDA approved for NASAL specimens only), is one component of a comprehensive MRSA colonization surveillance program. It is not intended to diagnose MRSA infection nor to guide or monitor treatment for MRSA infections.     Time coordinating discharge: 35 minutes  SIGNED:  Irwin Brakeman, MD  [facility] 06/17/2017, 1:59 PM Pager [phone]  If 7PM-7AM, please contact night-coverage [email or web address] Password [email or web address]

## 2017-06-17 NOTE — Progress Notes (Addendum)
Patient very agitated and confused.  Patient trying to get oob without assistance. Patient pulling at foley catheter.   Nursing staff sitting in room with patient for safety.  Notified MD of patient condition.   Received order for medication.  Will continue to monitor patient.

## 2017-06-17 NOTE — Care Management Important Message (Signed)
Important Message  Patient Details  Name: Allison Collier MRN: [id number] Date of Birth: 1921/07/22   Medicare Important Message Given:  Yes    Manish Ruggiero, Chauncey Reading, RN 06/17/2017, 1:50 PM

## 2017-06-17 NOTE — Clinical Social Work Note (Signed)
CSW informed that MD would like to dc pt today. CSW met with pt's daughter Remo Lipps at pt bedside. Offered emotional support. Remo Lipps states that family members are in agreement now that they would like hospice care for pt. Discussed hospice home referral vs home or [place] with hospice. Remo Lipps requests hospice home. Referral started to [facility]. CSW Ambrose Pancoast will follow up this afternoon to finalize the dc plan.

## 2017-06-17 NOTE — Discharge Instructions (Signed)
Follow with Primary MD  Sharion Balloon, FNP  and other consultant's as instructed your Hospitalist MD  Please get a complete blood count and chemistry panel checked by your Primary MD at your next visit, and again as instructed by your Primary MD.  Get Medicines reviewed and adjusted: Please take all your medications with you for your next visit with your Primary MD  Laboratory/radiological data: Please request your Primary MD to go over all hospital tests and procedure/radiological results at the follow up, please ask your Primary MD to get all Hospital records sent to his/her office.  In some cases, they will be blood work, cultures and biopsy results pending at the time of your discharge. Please request that your primary care M.D. follows up on these results.  Also Note the following: If you experience worsening of your admission symptoms, develop shortness of breath, life threatening emergency, suicidal or homicidal thoughts you must seek medical attention immediately by calling 911 or calling your MD immediately  if symptoms less severe.  You must read complete instructions/literature along with all the possible adverse reactions/side effects for all the Medicines you take and that have been prescribed to you. Take any new Medicines after you have completely understood and accpet all the possible adverse reactions/side effects.   Do not drive when taking Pain medications or sleeping medications (Benzodaizepines)  Do not take more than prescribed Pain, Sleep and Anxiety Medications. It is not advisable to combine anxiety,sleep and pain medications without talking with your primary care practitioner  Special Instructions: If you have smoked or chewed Tobacco  in the last 2 yrs please stop smoking, stop any regular Alcohol  and or any Recreational drug use.  Wear Seat belts while driving.  Please note: You were cared for by a hospitalist during your hospital stay. Once you are  discharged, your primary care physician will handle any further medical issues. Please note that NO REFILLS for any discharge medications will be authorized once you are discharged, as it is imperative that you return to your primary care physician (or establish a relationship with a primary care physician if you do not have one) for your post hospital discharge needs so that they can reassess your need for medications and monitor your lab values.

## 2017-06-27 ENCOUNTER — Other Ambulatory Visit: Payer: Self-pay | Admitting: Family

## 2017-07-20 DEATH — deceased
